# Patient Record
Sex: Female | Born: 1975 | Race: White | Hispanic: No | Marital: Married | Smoking: Never smoker
Health system: Southern US, Community
[De-identification: ages and names within clinical notes are randomized; demographics above are authoritative.]

## PROBLEM LIST (undated history)

## (undated) DIAGNOSIS — M2550 Pain in unspecified joint: Secondary | ICD-10-CM

## (undated) DIAGNOSIS — I10 Essential (primary) hypertension: Secondary | ICD-10-CM

## (undated) DIAGNOSIS — R87629 Unspecified abnormal cytological findings in specimens from vagina: Secondary | ICD-10-CM

## (undated) DIAGNOSIS — R0602 Shortness of breath: Secondary | ICD-10-CM

## (undated) DIAGNOSIS — R7303 Prediabetes: Secondary | ICD-10-CM

## (undated) DIAGNOSIS — N289 Disorder of kidney and ureter, unspecified: Secondary | ICD-10-CM

## (undated) DIAGNOSIS — E039 Hypothyroidism, unspecified: Secondary | ICD-10-CM

## (undated) DIAGNOSIS — F419 Anxiety disorder, unspecified: Secondary | ICD-10-CM

## (undated) DIAGNOSIS — Z9889 Other specified postprocedural states: Secondary | ICD-10-CM

## (undated) DIAGNOSIS — Z87442 Personal history of urinary calculi: Secondary | ICD-10-CM

## (undated) DIAGNOSIS — E079 Disorder of thyroid, unspecified: Secondary | ICD-10-CM

## (undated) DIAGNOSIS — R112 Nausea with vomiting, unspecified: Secondary | ICD-10-CM

## (undated) DIAGNOSIS — N200 Calculus of kidney: Secondary | ICD-10-CM

## (undated) DIAGNOSIS — M7989 Other specified soft tissue disorders: Secondary | ICD-10-CM

## (undated) DIAGNOSIS — E559 Vitamin D deficiency, unspecified: Secondary | ICD-10-CM

## (undated) HISTORY — DX: Disorder of thyroid, unspecified: E07.9

## (undated) HISTORY — DX: Anxiety disorder, unspecified: F41.9

## (undated) HISTORY — DX: Pain in unspecified joint: M25.50

## (undated) HISTORY — PX: NO PAST SURGERIES: SHX2092

## (undated) HISTORY — DX: Calculus of kidney: N20.0

## (undated) HISTORY — DX: Other specified soft tissue disorders: M79.89

## (undated) HISTORY — DX: Unspecified abnormal cytological findings in specimens from vagina: R87.629

## (undated) HISTORY — DX: Vitamin D deficiency, unspecified: E55.9

## (undated) HISTORY — DX: Disorder of kidney and ureter, unspecified: N28.9

## (undated) HISTORY — DX: Shortness of breath: R06.02

## (undated) HISTORY — DX: Prediabetes: R73.03

## (undated) HISTORY — DX: Essential (primary) hypertension: I10

## (undated) HISTORY — DX: Hypothyroidism, unspecified: E03.9

---

## 2007-08-03 ENCOUNTER — Other Ambulatory Visit: Admission: RE | Admit: 2007-08-03 | Discharge: 2007-08-03 | Payer: Self-pay | Admitting: Obstetrics and Gynecology

## 2007-08-20 ENCOUNTER — Inpatient Hospital Stay (HOSPITAL_COMMUNITY): Admission: AD | Admit: 2007-08-20 | Discharge: 2007-08-20 | Payer: Self-pay | Admitting: Obstetrics and Gynecology

## 2007-08-31 ENCOUNTER — Other Ambulatory Visit: Admission: RE | Admit: 2007-08-31 | Discharge: 2007-08-31 | Payer: Self-pay | Admitting: Obstetrics and Gynecology

## 2008-01-05 ENCOUNTER — Inpatient Hospital Stay (HOSPITAL_COMMUNITY): Admission: AD | Admit: 2008-01-05 | Discharge: 2008-01-07 | Payer: Self-pay | Admitting: Obstetrics & Gynecology

## 2008-01-05 ENCOUNTER — Ambulatory Visit: Payer: Self-pay | Admitting: Family

## 2010-02-11 ENCOUNTER — Other Ambulatory Visit: Admission: RE | Admit: 2010-02-11 | Discharge: 2010-02-11 | Payer: Self-pay | Admitting: Obstetrics and Gynecology

## 2011-04-15 NOTE — Consult Note (Signed)
NAME:  Teresa Osborne, Teresa Osborne NO.:  [id number]   MEDICAL RECORD NO.:  [id number]          PATIENT TYPE:  MAT   LOCATION:  [facility]                          FACILITY:  [facility]   PHYSICIAN:  Lazaro Arms, M.D.   DATE OF BIRTH:  March 06, 1976   DATE OF CONSULTATION:  08/20/2007  DATE OF DISCHARGE:                                 CONSULTATION   This is an MAU visit at [facility].   [name] is a 34 year old gravida 2, para 1, at 35 weeks' gestation who  came in complaining of some bleeding when she wiped earlier this evening  after work and then shortly thereafter.  Since she has been here, she  has had nothing significant, just a little bit of spotting when she  wiped.  She has not had a 20 week sonogram as of yet.  We obtained a  sonogram which revealed an anterior placenta, no previa, cervix 3.8 cm,  no evidence of blood in the uterus or behind the placenta.  Sterile  speculum exam was performed.  She does have a history of Chlamydia with  the pregnancy.  Chlamydia cultures obtained, and sterile speculum  reveals absolutely no blood in the vault whatsoever.  I cannot see any  source.  There has not been any evidence that she has had any.  In any  event, she is told to remain at pelvic rest, taken out of work for the  next couple of days, told to keep her routine scheduled appointment in  the office.      Lazaro Arms, M.D.  Electronically Signed     LHE/MEDQ  D:  08/20/2007  T:  08/21/2007  Job:  [id number]

## 2011-07-21 ENCOUNTER — Other Ambulatory Visit: Payer: Self-pay | Admitting: Adult Health

## 2011-07-21 ENCOUNTER — Other Ambulatory Visit (HOSPITAL_COMMUNITY)
Admission: RE | Admit: 2011-07-21 | Discharge: 2011-07-21 | Disposition: A | Discharge status: Home / Self Care | Payer: Self-pay | Source: Ambulatory Visit | Attending: Obstetrics and Gynecology | Admitting: Obstetrics and Gynecology

## 2011-07-21 DIAGNOSIS — Z01419 Encounter for gynecological examination (general) (routine) without abnormal findings: Secondary | ICD-10-CM | POA: Insufficient documentation

## 2011-07-21 DIAGNOSIS — Z113 Encounter for screening for infections with a predominantly sexual mode of transmission: Secondary | ICD-10-CM | POA: Insufficient documentation

## 2011-08-22 LAB — COMPREHENSIVE METABOLIC PANEL
ALT: 14
Albumin: 2.4 — ABNORMAL LOW
Alkaline Phosphatase: 109
BUN: 7
Chloride: 105
Glucose, Bld: 135 — ABNORMAL HIGH
Potassium: 3.9
Sodium: 135
Total Bilirubin: 0.6
Total Protein: 6.4

## 2011-08-22 LAB — CBC
HCT: 38.3
Hemoglobin: 13.2
Platelets: 289
RDW: 14.3
WBC: 10.3

## 2011-09-11 LAB — URINALYSIS, ROUTINE W REFLEX MICROSCOPIC
Bilirubin Urine: NEGATIVE
Hgb urine dipstick: NEGATIVE
Ketones, ur: NEGATIVE
Specific Gravity, Urine: 1.02
pH: 6

## 2011-09-11 LAB — GC/CHLAMYDIA PROBE AMP, GENITAL: Chlamydia, DNA Probe: NEGATIVE

## 2012-06-03 ENCOUNTER — Emergency Department (HOSPITAL_COMMUNITY)
Admission: EM | Admit: 2012-06-03 | Discharge: 2012-06-03 | Disposition: A | Discharge status: Home / Self Care | Payer: BC Managed Care – PPO | Attending: Emergency Medicine | Admitting: Emergency Medicine

## 2012-06-03 ENCOUNTER — Encounter (HOSPITAL_COMMUNITY): Payer: Self-pay | Admitting: Emergency Medicine

## 2012-06-03 ENCOUNTER — Emergency Department (HOSPITAL_COMMUNITY): Payer: BC Managed Care – PPO

## 2012-06-03 DIAGNOSIS — N200 Calculus of kidney: Secondary | ICD-10-CM | POA: Insufficient documentation

## 2012-06-03 DIAGNOSIS — R110 Nausea: Secondary | ICD-10-CM | POA: Insufficient documentation

## 2012-06-03 DIAGNOSIS — R109 Unspecified abdominal pain: Secondary | ICD-10-CM | POA: Insufficient documentation

## 2012-06-03 DIAGNOSIS — Z79899 Other long term (current) drug therapy: Secondary | ICD-10-CM | POA: Insufficient documentation

## 2012-06-03 DIAGNOSIS — M549 Dorsalgia, unspecified: Secondary | ICD-10-CM | POA: Insufficient documentation

## 2012-06-03 LAB — CBC WITH DIFFERENTIAL/PLATELET
Basophils Absolute: 0 10*3/uL (ref 0.0–0.1)
HCT: 39.2 % (ref 36.0–46.0)
Hemoglobin: 13.2 g/dL (ref 12.0–15.0)
Lymphocytes Relative: 20 % (ref 12–46)
Lymphs Abs: 1.8 10*3/uL (ref 0.7–4.0)
MCV: 89.3 fL (ref 78.0–100.0)
Monocytes Absolute: 0.7 10*3/uL (ref 0.1–1.0)
Monocytes Relative: 8 % (ref 3–12)
Neutro Abs: 6 10*3/uL (ref 1.7–7.7)
RBC: 4.39 MIL/uL (ref 3.87–5.11)
WBC: 8.6 10*3/uL (ref 4.0–10.5)

## 2012-06-03 LAB — URINALYSIS, ROUTINE W REFLEX MICROSCOPIC
Bilirubin Urine: NEGATIVE
Glucose, UA: NEGATIVE mg/dL
Nitrite: NEGATIVE
Specific Gravity, Urine: 1.028 (ref 1.005–1.030)
pH: 6 (ref 5.0–8.0)

## 2012-06-03 LAB — POCT I-STAT, CHEM 8
BUN: 14 mg/dL (ref 6–23)
Chloride: 105 mEq/L (ref 96–112)
Glucose, Bld: 105 mg/dL — ABNORMAL HIGH (ref 70–99)
HCT: 46 % (ref 36.0–46.0)
Potassium: 3.9 mEq/L (ref 3.5–5.1)

## 2012-06-03 LAB — URINE MICROSCOPIC-ADD ON

## 2012-06-03 MED ORDER — OXYCODONE-ACETAMINOPHEN 5-325 MG PO TABS: 1.0000 | ORAL_TABLET | ORAL | Status: AC | PRN

## 2012-06-03 MED ORDER — TAMSULOSIN HCL 0.4 MG PO CAPS: 0.4000 mg | ORAL_CAPSULE | Freq: Every day | ORAL | Status: DC

## 2012-06-03 MED ORDER — KETOROLAC TROMETHAMINE 30 MG/ML IJ SOLN
30.0000 mg | Freq: Once | INTRAMUSCULAR | Status: AC
  Administered 2012-06-03: 30 mg via INTRAVENOUS
  Filled 2012-06-03: qty 1

## 2012-06-03 MED ORDER — TAMSULOSIN HCL 0.4 MG PO CAPS
0.4000 mg | ORAL_CAPSULE | Freq: Once | ORAL | Status: AC
  Administered 2012-06-03: 0.4 mg via ORAL
  Filled 2012-06-03: qty 1

## 2012-06-03 MED ORDER — IBUPROFEN 600 MG PO TABS: 600.0000 mg | ORAL_TABLET | Freq: Four times a day (QID) | ORAL | Status: AC | PRN

## 2012-06-03 MED ORDER — ONDANSETRON HCL 4 MG/2ML IJ SOLN
4.0000 mg | Freq: Once | INTRAMUSCULAR | Status: AC
  Administered 2012-06-03: 4 mg via INTRAVENOUS
  Filled 2012-06-03: qty 2

## 2012-06-03 NOTE — ED Notes (Signed)
Pt states she is having pain in her left side that radiates into her back  Pt states she has some menstrual cramping as well   Pt states her pain started around 2130 last night  Pt states has nausea without diarrhea or vomiting Denies urinary sxs or female issues

## 2012-06-03 NOTE — ED Notes (Signed)
Patient transported to CT 

## 2012-06-03 NOTE — ED Provider Notes (Signed)
History     CSN: [id number]  Arrival date & time 06/03/12  4098   First MD Initiated Contact with Patient 06/03/12 0602      Chief Complaint  Patient presents with  . Abdominal Pain    (Consider location/radiation/quality/duration/timing/severity/associated sxs/prior treatment) Patient is a 36 y.o. female presenting with abdominal pain. The history is provided by the patient.  Abdominal Pain The primary symptoms of the illness include abdominal pain and nausea. The primary symptoms of the illness do not include fever, shortness of breath, vomiting, diarrhea or dysuria.  Additional symptoms associated with the illness include back pain. Symptoms associated with the illness do not include chills, urgency, hematuria or frequency.  Pt with intermittent left flank pain for last month. States went to [facility] and was told she may have a kidney stone, this was a month ago. She was given pain medications. Stats pain subsided since then and began again yesterday. Pt denies fever, chills, urinary symptoms. Pain in left flank, radiating into lower abdomen. Denies vagina discharge or bleeding.   History reviewed. No pertinent past medical history.  History reviewed. No pertinent past surgical history.  Family History  Problem Relation Age of Onset  . Cancer Other     History  Substance Use Topics  . Smoking status: Never Smoker   . Smokeless tobacco: Not on file  . Alcohol Use: No    OB History    Grav Para Term Preterm Abortions TAB SAB Ect Mult Living                  Review of Systems  Constitutional: Negative for fever and chills.  Respiratory: Negative for chest tightness and shortness of breath.   Cardiovascular: Negative.   Gastrointestinal: Positive for nausea and abdominal pain. Negative for vomiting and diarrhea.  Genitourinary: Positive for flank pain. Negative for dysuria, urgency, frequency and hematuria.  Musculoskeletal: Positive for back pain.  Skin: Negative.     Neurological: Negative for dizziness, weakness, numbness and headaches.    Allergies  Review of patient's allergies indicates no known allergies.  Home Medications   Current Outpatient Rx  Name Route Sig Dispense Refill  . CIPROFLOXACIN HCL 500 MG PO TABS Oral Take 500 mg by mouth 2 (two) times daily. For 10 days. Start date:06/02/12    . HYDROCODONE-ACETAMINOPHEN 5-325 MG PO TABS Oral Take 1 tablet by mouth every 6 (six) hours as needed.      BP 143/80  Pulse 111  Temp 97.7 F (36.5 C) (Oral)  Resp 19  SpO2 98%  Physical Exam  Nursing note and vitals reviewed. Constitutional: She is oriented to person, place, and time. She appears well-developed and well-nourished. No distress.       obese  HENT:  Head: Normocephalic.  Eyes: Conjunctivae are normal.  Cardiovascular: Normal rate, regular rhythm and normal heart sounds.   Pulmonary/Chest: Effort normal and breath sounds normal. No respiratory distress. She has no wheezes. She has no rales.  Abdominal: Soft. Bowel sounds are normal. She exhibits no distension. There is no tenderness.       cva tenderness left  Neurological: She is alert and oriented to person, place, and time.  Skin: Skin is warm and dry.  Psychiatric: She has a normal mood and affect.    ED Course  Procedures (including critical care time)  Left flank pain intermittently for a month. Story consistent with a kidney stone. UA, ct pending.   Results for orders placed during the hospital encounter  of 06/03/12  CBC WITH DIFFERENTIAL      Component Value Range   WBC 8.6  4.0 - 10.5 K/uL   RBC 4.39  3.87 - 5.11 MIL/uL   Hemoglobin 13.2  12.0 - 15.0 g/dL   HCT 91.4  78.2 - 95.6 %   MCV 89.3  78.0 - 100.0 fL   MCH 30.1  26.0 - 34.0 pg   MCHC 33.7  30.0 - 36.0 g/dL   RDW 21.3  08.6 - 57.8 %   Platelets 332  150 - 400 K/uL   Neutrophils Relative 69  43 - 77 %   Neutro Abs 6.0  1.7 - 7.7 K/uL   Lymphocytes Relative 20  12 - 46 %   Lymphs Abs 1.8  0.7 -  4.0 K/uL   Monocytes Relative 8  3 - 12 %   Monocytes Absolute 0.7  0.1 - 1.0 K/uL   Eosinophils Relative 2  0 - 5 %   Eosinophils Absolute 0.2  0.0 - 0.7 K/uL   Basophils Relative 0  0 - 1 %   Basophils Absolute 0.0  0.0 - 0.1 K/uL  URINALYSIS, ROUTINE W REFLEX MICROSCOPIC      Component Value Range   Color, Urine YELLOW  YELLOW   APPearance CLOUDY (*) CLEAR   Specific Gravity, Urine 1.028  1.005 - 1.030   pH 6.0  5.0 - 8.0   Glucose, UA NEGATIVE  NEGATIVE mg/dL   Hgb urine dipstick LARGE (*) NEGATIVE   Bilirubin Urine NEGATIVE  NEGATIVE   Ketones, ur NEGATIVE  NEGATIVE mg/dL   Protein, ur 30 (*) NEGATIVE mg/dL   Urobilinogen, UA 0.2  0.0 - 1.0 mg/dL   Nitrite NEGATIVE  NEGATIVE   Leukocytes, UA TRACE (*) NEGATIVE  POCT I-STAT, CHEM 8      Component Value Range   Sodium 140  135 - 145 mEq/L   Potassium 3.9  3.5 - 5.1 mEq/L   Chloride 105  96 - 112 mEq/L   BUN 14  6 - 23 mg/dL   Creatinine, Ser 4.69 (*) 0.50 - 1.10 mg/dL   Glucose, Bld 629 (*) 70 - 99 mg/dL   Calcium, Ion 5.28  4.13 - 1.23 mmol/L   TCO2 22  0 - 100 mmol/L   Hemoglobin 15.6 (*) 12.0 - 15.0 g/dL   HCT 24.4  01.0 - 27.2 %  URINE MICROSCOPIC-ADD ON      Component Value Range   Squamous Epithelial / LPF FEW (*) RARE   WBC, UA 0-2  <3 WBC/hpf   RBC / HPF 7-10  <3 RBC/hpf   Bacteria, UA MANY (*) RARE   Ct Abdomen Pelvis Wo Contrast  06/03/2012  *RADIOLOGY REPORT*  Clinical Data: Left flank pain  CT ABDOMEN AND PELVIS WITHOUT CONTRAST  Technique:  Multidetector CT imaging of the abdomen and pelvis was performed following the standard protocol without intravenous contrast.  Comparison: None.  Findings: Limited images through the lung bases demonstrate no significant appreciable abnormality. The heart size is within normal limits. No pleural or pericardial effusion.  Organ abnormality/lesion detection is limited in the absence of intravenous contrast. Within this limitation, low attenuation of the liver is in keeping  with fatty infiltration.  Unremarkable biliary system, spleen, pancreas, adrenal glands.  Unremarkable right kidney.  The left kidney is edematous with perinephric fat stranding.  There is a mild to moderate hydroureteronephrosis to the level of a 3.5 mm left UVJ stone.  No bowel obstruction.  No  CT evidence for colitis.  Normal appendix.  No free intraperitoneal air or fluid.  No lymphadenopathy.  Normal caliber vasculature.  Partially decompressed bladder.  IUD within the uterus. Ovaries identified however cannot be further characterized by noncontrast CT.  No acute osseous finding. L5-S1 degenerative disc disease.  IMPRESSION: Left renal edema, mild hydronephrosis and moderate hydroureter to the level of a 3.5 mm left UVJ stone.  Hepatic steatosis.  Original Report Authenticated By: Waneta Martins, M.D.   7:46 AM Spoke with Dr. Annabell Howells urology. Recommended continue cipro (pt on it since yesterday after seeing her GYN), flomax, pain medications, follow up in the office. Pt agreeable with the plan.      1. Kidney stone on left side       MDM         Lottie Mussel, PA 06/03/12 1535

## 2012-06-03 NOTE — ED Notes (Signed)
PA Kirichenko at bedside 

## 2012-06-03 NOTE — ED Provider Notes (Signed)
Medical screening examination/treatment/procedure(s) were performed by non-physician practitioner and as supervising physician I was immediately available for consultation/collaboration.  Flint Melter, MD 06/03/12 [phone]

## 2012-06-03 NOTE — ED Notes (Signed)
Pt states she has been seen at urgent care and by her gyn dr  Pt was started on antibiotics by her gyn for possible uterus infection

## 2013-03-30 ENCOUNTER — Ambulatory Visit (INDEPENDENT_AMBULATORY_CARE_PROVIDER_SITE_OTHER): Payer: BC Managed Care – PPO | Admitting: Advanced Practice Midwife

## 2013-03-30 ENCOUNTER — Encounter: Payer: Self-pay | Admitting: Advanced Practice Midwife

## 2013-03-30 VITALS — BP 134/70 | Ht 65.0 in | Wt >= 6400 oz

## 2013-03-30 DIAGNOSIS — Z3200 Encounter for pregnancy test, result unknown: Secondary | ICD-10-CM

## 2013-03-30 DIAGNOSIS — Z30430 Encounter for insertion of intrauterine contraceptive device: Secondary | ICD-10-CM

## 2013-03-30 DIAGNOSIS — Z3202 Encounter for pregnancy test, result negative: Secondary | ICD-10-CM

## 2013-03-30 DIAGNOSIS — Z975 Presence of (intrauterine) contraceptive device: Secondary | ICD-10-CM

## 2013-03-30 DIAGNOSIS — Z30432 Encounter for removal of intrauterine contraceptive device: Secondary | ICD-10-CM

## 2013-03-30 DIAGNOSIS — Z6841 Body Mass Index (BMI) 40.0 and over, adult: Secondary | ICD-10-CM

## 2013-03-30 LAB — POCT URINE PREGNANCY: Preg Test, Ur: NEGATIVE

## 2013-03-30 NOTE — Progress Notes (Signed)
Teresa Osborne is a 37 y.o. year old African American female Gravida 2 Para 2  who presents for removal and replacement of a Mirena IUD.  It has been 5 years since her previous IUD placement.  The risks and benefits of the method and placement have been thouroughly reviewed with the patient and all questions were answered.  Specifically the patient is aware of failure rate of 12/998, expulsion of the IUD and of possible perforation.  The patient is aware of irregular bleeding due to the method and understands the incidence of irregular bleeding diminishes with time.  Time out was performed.  A Graves speculum was placed.  The cervix was very difficult to access;  A large snowman speculum was used and the cervix was very posterior. It was prepped using Betadine. The strings were found to be  visible.   They were grasped and the Mirena was easily removed. The cervix was then grasped with a tenaculum and the uterus was sounded to 8 cm. The IUD was inserted to 8 cm.  It was pulled back 1 cm and the IUD was disengaged.  The strings were trimmed to 3 cm.  Sonogram was performed and the proper placement of the IUD was verified.  The patient was instructed on signs and symptoms of infection and to check for the strings after each menses or each month.  The patient is to refrain from intercourse for 3 days.

## 2013-03-30 NOTE — Patient Instructions (Signed)
Levonorgestrel intrauterine device (IUD) What is this medicine? LEVONORGESTREL IUD (LEE voe nor jes trel) is a contraceptive (birth control) device. The device is placed inside the uterus by a healthcare professional. It is used to prevent pregnancy and can also be used to treat heavy bleeding that occurs during your period. Depending on the device, it can be used for 3 to 5 years. This medicine may be used for other purposes; ask your health care provider or pharmacist if you have questions. What should I tell my health care provider before I take this medicine? They need to know if you have any of these conditions: -abnormal Pap smear -cancer of the breast, uterus, or cervix -diabetes -endometritis -genital or pelvic infection now or in the past -have more than one sexual partner or your partner has more than one partner -heart disease -history of an ectopic or tubal pregnancy -immune system problems -IUD in place -liver disease or tumor -problems with blood clots or take blood-thinners -use intravenous drugs -uterus of unusual shape -vaginal bleeding that has not been explained -an unusual or allergic reaction to levonorgestrel, other hormones, silicone, or polyethylene, medicines, foods, dyes, or preservatives -pregnant or trying to get pregnant -breast-feeding How should I use this medicine? This device is placed inside the uterus by a health care professional. Talk to your pediatrician regarding the use of this medicine in children. Special care may be needed. Overdosage: If you think you have taken too much of this medicine contact a poison control center or emergency room at once. NOTE: This medicine is only for you. Do not share this medicine with others. What if I miss a dose? This does not apply. What may interact with this medicine? Do not take this medicine with any of the following medications: -amprenavir -bosentan -fosamprenavir This medicine may also interact with  the following medications: -aprepitant -barbiturate medicines for inducing sleep or treating seizures -bexarotene -griseofulvin -medicines to treat seizures like carbamazepine, ethotoin, felbamate, oxcarbazepine, phenytoin, topiramate -modafinil -pioglitazone -rifabutin -rifampin -rifapentine -some medicines to treat HIV infection like atazanavir, indinavir, lopinavir, nelfinavir, tipranavir, ritonavir -St. John's wort -warfarin This list may not describe all possible interactions. Give your health care provider a list of all the medicines, herbs, non-prescription drugs, or dietary supplements you use. Also tell them if you smoke, drink alcohol, or use illegal drugs. Some items may interact with your medicine. What should I watch for while using this medicine? Visit your doctor or health care professional for regular check ups. See your doctor if you or your partner has sexual contact with others, becomes HIV positive, or gets a sexual transmitted disease. This product does not protect you against HIV infection (AIDS) or other sexually transmitted diseases. You can check the placement of the IUD yourself by reaching up to the top of your vagina with clean fingers to feel the threads. Do not pull on the threads. It is a good habit to check placement after each menstrual period. Call your doctor right away if you feel more of the IUD than just the threads or if you cannot feel the threads at all. The IUD may come out by itself. You may become pregnant if the device comes out. If you notice that the IUD has come out use a backup birth control method like condoms and call your health care provider. Using tampons will not change the position of the IUD and are okay to use during your period. What side effects may I notice from receiving this medicine?   Side effects that you should report to your doctor or health care professional as soon as possible: -allergic reactions like skin rash, itching or  hives, swelling of the face, lips, or tongue -fever, flu-like symptoms -genital sores -high blood pressure -no menstrual period for 6 weeks during use -pain, swelling, warmth in the leg -pelvic pain or tenderness -severe or sudden headache -signs of pregnancy -stomach cramping -sudden shortness of breath -trouble with balance, talking, or walking -unusual vaginal bleeding, discharge -yellowing of the eyes or skin Side effects that usually do not require medical attention (report to your doctor or health care professional if they continue or are bothersome): -acne -breast pain -change in sex drive or performance -changes in weight -cramping, dizziness, or faintness while the device is being inserted -headache -irregular menstrual bleeding within first 3 to 6 months of use -nausea This list may not describe all possible side effects. Call your doctor for medical advice about side effects. You may report side effects to FDA at 1-800-FDA-1088. Where should I keep my medicine? This does not apply. NOTE: This sheet is a summary. It may not cover all possible information. If you have questions about this medicine, talk to your doctor, pharmacist, or health care provider.  2013, Elsevier/Gold Standard. (12/18/2011 1:54:04 PM)  

## 2013-04-28 ENCOUNTER — Ambulatory Visit: Payer: BC Managed Care – PPO | Admitting: Advanced Practice Midwife

## 2013-04-28 ENCOUNTER — Encounter: Payer: Self-pay | Admitting: *Deleted

## 2014-10-02 ENCOUNTER — Encounter: Payer: Self-pay | Admitting: Advanced Practice Midwife

## 2016-01-09 ENCOUNTER — Ambulatory Visit (INDEPENDENT_AMBULATORY_CARE_PROVIDER_SITE_OTHER): Payer: BLUE CROSS/BLUE SHIELD | Admitting: Family Medicine

## 2016-01-09 ENCOUNTER — Encounter: Payer: Self-pay | Admitting: Family Medicine

## 2016-01-09 VITALS — BP 136/73 | HR 62 | Temp 98.3°F | Resp 20 | Ht 65.0 in | Wt >= 6400 oz

## 2016-01-09 DIAGNOSIS — E669 Obesity, unspecified: Secondary | ICD-10-CM | POA: Diagnosis not present

## 2016-01-09 DIAGNOSIS — Z131 Encounter for screening for diabetes mellitus: Secondary | ICD-10-CM | POA: Diagnosis not present

## 2016-01-09 DIAGNOSIS — Z1321 Encounter for screening for nutritional disorder: Secondary | ICD-10-CM | POA: Diagnosis not present

## 2016-01-09 DIAGNOSIS — Z7689 Persons encountering health services in other specified circumstances: Secondary | ICD-10-CM

## 2016-01-09 DIAGNOSIS — Z130 Encounter for screening for diseases of the blood and blood-forming organs and certain disorders involving the immune mechanism: Secondary | ICD-10-CM | POA: Diagnosis not present

## 2016-01-09 DIAGNOSIS — Z13220 Encounter for screening for lipoid disorders: Secondary | ICD-10-CM

## 2016-01-09 DIAGNOSIS — Z7189 Other specified counseling: Secondary | ICD-10-CM | POA: Diagnosis not present

## 2016-01-09 DIAGNOSIS — Z6841 Body Mass Index (BMI) 40.0 and over, adult: Secondary | ICD-10-CM | POA: Diagnosis not present

## 2016-01-09 DIAGNOSIS — E559 Vitamin D deficiency, unspecified: Secondary | ICD-10-CM

## 2016-01-09 DIAGNOSIS — Z0000 Encounter for general adult medical examination without abnormal findings: Secondary | ICD-10-CM | POA: Insufficient documentation

## 2016-01-09 LAB — COMPREHENSIVE METABOLIC PANEL
ALT: 14 U/L (ref 0–35)
AST: 12 U/L (ref 0–37)
Albumin: 3.9 g/dL (ref 3.5–5.2)
Alkaline Phosphatase: 69 U/L (ref 39–117)
BUN: 11 mg/dL (ref 6–23)
CHLORIDE: 107 meq/L (ref 96–112)
CO2: 28 mEq/L (ref 19–32)
Calcium: 9.2 mg/dL (ref 8.4–10.5)
Creatinine, Ser: 0.72 mg/dL (ref 0.40–1.20)
GFR: 95.4 mL/min (ref >60.00)
GLUCOSE: 87 mg/dL (ref 70–99)
POTASSIUM: 4.7 meq/L (ref 3.5–5.1)
SODIUM: 141 meq/L (ref 135–145)
Total Bilirubin: 0.3 mg/dL (ref 0.2–1.2)
Total Protein: 7 g/dL (ref 6.0–8.3)

## 2016-01-09 LAB — CBC WITH DIFFERENTIAL/PLATELET
BASOS ABS: 0.1 10*3/uL (ref 0.0–0.1)
Basophils Relative: 1.2 % (ref 0.0–3.0)
EOS ABS: 0.2 10*3/uL (ref 0.0–0.7)
Eosinophils Relative: 2.9 % (ref 0.0–5.0)
HCT: 43 % (ref 36.0–46.0)
Hemoglobin: 14.1 g/dL (ref 12.0–15.0)
LYMPHS ABS: 1.8 10*3/uL (ref 0.7–4.0)
Lymphocytes Relative: 33.5 % (ref 12.0–46.0)
MCHC: 32.8 g/dL (ref 30.0–36.0)
MCV: 91.9 fl (ref 78.0–100.0)
Monocytes Absolute: 0.3 10*3/uL (ref 0.1–1.0)
Monocytes Relative: 5.3 % (ref 3.0–12.0)
NEUTROS ABS: 3.1 10*3/uL (ref 1.4–7.7)
NEUTROS PCT: 57.1 % (ref 43.0–77.0)
PLATELETS: 328 10*3/uL (ref 150.0–400.0)
RBC: 4.67 Mil/uL (ref 3.87–5.11)
RDW: 13.6 % (ref 11.5–15.5)
WBC: 5.5 10*3/uL (ref 4.0–10.5)

## 2016-01-09 LAB — TSH: TSH: 2.69 u[IU]/mL (ref 0.35–4.50)

## 2016-01-09 LAB — VITAMIN D 25 HYDROXY (VIT D DEFICIENCY, FRACTURES): VITD: 17.58 ng/mL — AB (ref 30.00–100.00)

## 2016-01-09 LAB — LIPID PANEL
CHOL/HDL RATIO: 4
Cholesterol: 178 mg/dL (ref 0–200)
HDL: 49.1 mg/dL (ref >39.00)
LDL CALC: 112 mg/dL — AB (ref 0–99)
NONHDL: 128.51
Triglycerides: 84 mg/dL (ref 0.0–149.0)
VLDL: 16.8 mg/dL (ref 0.0–40.0)

## 2016-01-09 LAB — HEMOGLOBIN A1C: Hgb A1c MFr Bld: 5.5 % (ref 4.6–6.5)

## 2016-01-09 NOTE — Progress Notes (Signed)
Patient ID: Teresa Osborne, female   DOB: 1976/06/26, 40 y.o.   MRN: [id number]      Patient ID: Teresa Osborne, female  DOB: 11-Nov-1976, 40 y.o.   MRN: [id number]  Subjective:  Teresa Osborne is a 40 y.o. female present for establishment of care with annual exam. All past medical history, surgical history, allergies, family history, immunizations, medications and social history were obtained and entered in the electronic medical record today. All recent labs, ED visits and hospitalizations within the last year were reviewed.  Health maintenance:  Colonoscopy: No known family history of colon cancer. Routine screening at age 25. Mammogram: No known history of breast cancer in the family. Routine screening age 40 Cervical cancer screening: family Tree, [place]. Teresa Finner, NP. Last Pap 2014, per patient report. Reports all normal Paps. Continue follow-up with gynecology. Immunizations: tdap 2008, declines flu shot Infectious disease screening: HIV 2008 DEXA: Routine screening at age 8 Assistive device: None Oxygen use: None  Patient has a Dental home. [place] Hospitalizations/ED visits: None  Past Medical History  Diagnosis Date  . Anxiety   . Kidney stones    No Known Allergies Past Surgical History  Procedure Laterality Date  . No past surgeries     Family History  Problem Relation Age of Onset  . Cancer Paternal Grandfather     pancreatic  . Alcohol abuse Paternal Grandfather   . Arthritis Paternal Grandfather   . COPD Paternal Grandfather   . Early death Paternal Grandfather   . Cancer Paternal Grandmother     unknown, [place]  . Arthritis Paternal Grandmother   . COPD Paternal Grandmother   . Early death Paternal Grandmother   . Arthritis Father   . Mental retardation Father   . Arthritis Maternal Grandmother   . COPD Maternal Grandfather   . Heart disease Maternal Grandfather   . Cancer Sister 24    ovarian   Social History   Social History  .  Marital Status: Married    Spouse Name: N/A  . Number of Children: N/A  . Years of Education: N/A   Occupational History  . Not on file.   Social History Main Topics  . Smoking status: Never Smoker   . Smokeless tobacco: Never Used  . Alcohol Use: No  . Drug Use: No  . Sexual Activity: Yes    Birth Control/ Protection: IUD     Comment: mirena 03/2013   Other Topics Concern  . Not on file   Social History Narrative   Married to [place]. 2 children [name] in [place].   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   Drink caffeinated beverages, wears her seatbelt, wears a bike helmet.   Smoke detector in the home   Feels safe in her relationships      ROS: Negative, with the exception of above mentioned in HPI  Objective: BP 136/73 mmHg  Pulse 62  Temp(Src) 98.3 F (36.8 C) (Oral)  Resp 20  Ht 5\' 5"  (1.651 m)  Wt 411 lb 8 oz (186.655 kg)  BMI 68.48 kg/m2  SpO2 96% Gen: Afebrile. No acute distress. Nontoxic in appearance, well-developed, well-nourished, morbidly obese, Caucasian female. Pleasant. HENT: AT. [place]. Bilateral TM visualized and normal in appearance, normal external auditory canal. MMM, no oral lesions, good dentition. Bilateral nares without erythema or swelling. Throat without erythema, ulcerations or exudates. No Cough on exam, no hoarseness on exam. Eyes:Pupils Equal Round Reactive to light, Extraocular movements intact,  Conjunctiva without redness, discharge or icterus.  Neck/lymp/endocrine: Supple, no lymphadenopathy, no thyromegaly CV: RRR nom/c/g/r, no edema, +2/4 P posterior tibialis pulses.  No JVD. Chest: CTAB, no wheeze, rhonchi or crackles. Normal Respiratory effort. Good Air movement. Abd: Soft. Morbidly obese. NTND. BS present. No Masses palpated. No hepatosplenomegaly. No rebound tenderness or guarding. Exam hindered by body habitus. Skin: No rashes, purpura or petechiae. Warm and well-perfused. Skin intact. Neuro/Msk:  Normal gait.  PERLA. EOMi. Alert. Oriented x3.  Cranial nerves II through XII intact.  Psych: Normal affect, dress and demeanor. Normal speech. Normal thought content and judgment.  Assessment/plan: Teresa Osborne is a 40 y.o. female  present for establishment of care with annual exam.  BMI 60.0-69.9, adult (HCC)/Screening for diabetes mellitus/Lipid screening/ Screening for iron deficiency anemia/Encounter for vitamin deficiency screening/Routine health maintenance/Encounter for preventive health examination - Encouraged low saturated fat, healthy fruits and vegetables, lean meats. Discuss community exercise programs and information was given to patient today for local programs. - Patient was encouraged to consider Weight Watchers again. Patient was also instructed on how to calculate daily caloric intake as well as phone applications to track calories. - Hemoglobin A1C - CBC w/Diff - Lipid panel - VITAMIN D 25 Hydroxy (Vit-D Deficiency, Fractures) - TSH - Comp Met (CMET) - Patient will be called with all lab results, depending upon results may need to see sooner, otherwise yearly follow-up.  Health maintenance:  Colonoscopy: No known family history of colon cancer. Routine screening at age 5. Mammogram: No known history of breast cancer in the family. Routine screening age 29 Cervical cancer screening: family Tree, [place]. Teresa Bur, NP. Last Pap 2014, per patient report. Reports all normal Paps. Continue follow-up with gynecology. Immunizations: tdap 2008, declines flu shot Infectious disease screening: HIV 2008 DEXA: Routine screening at age 82  Patient was encouraged to exercise greater than 150 minutes a week. Patient was encouraged to choose a diet filled with fresh fruits and vegetables, and lean meats. AVS provided to patient today for education/recommendation on gender specific health and safety maintenance.  No Follow-up on file.   Howard Pouch, DO [place]

## 2016-01-09 NOTE — Patient Instructions (Signed)
Health Maintenance, Female Adopting a healthy lifestyle and getting preventive care can go a long way to promote health and wellness. Talk with your health care provider about what schedule of regular examinations is right for you. This is a good chance for you to check in with your provider about disease prevention and staying healthy. In between checkups, there are plenty of things you can do on your own. Experts have done a lot of research about which lifestyle changes and preventive measures are most likely to keep you healthy. Ask your health care provider for more information. WEIGHT AND DIET  Eat a healthy diet  Be sure to include plenty of vegetables, fruits, low-fat dairy products, and lean protein.  Do not eat a lot of foods high in solid fats, added sugars, or salt.  Get regular exercise. This is one of the most important things you can do for your health.  Most adults should exercise for at least 150 minutes each week. The exercise should increase your heart rate and make you sweat (moderate-intensity exercise).  Most adults should also do strengthening exercises at least twice a week. This is in addition to the moderate-intensity exercise.  Maintain a healthy weight  Body mass index (BMI) is a measurement that can be used to identify possible weight problems. It estimates body fat based on height and weight. Your health care provider can help determine your BMI and help you achieve or maintain a healthy weight.  For females 20 years of age and older:   A BMI below 18.5 is considered underweight.  A BMI of 18.5 to 24.9 is normal.  A BMI of 25 to 29.9 is considered overweight.  A BMI of 30 and above is considered obese.  Watch levels of cholesterol and blood lipids  You should start having your blood tested for lipids and cholesterol at 40 years of age, then have this test every 5 years.  You may need to have your cholesterol levels checked more often if:  Your lipid  or cholesterol levels are high.  You are older than 40 years of age.  You are at high risk for heart disease.  CANCER SCREENING   Lung Cancer  Lung cancer screening is recommended for adults 55-80 years old who are at high risk for lung cancer because of a history of smoking.  A yearly low-dose CT scan of the lungs is recommended for people who:  Currently smoke.  Have quit within the past 15 years.  Have at least a 30-pack-year history of smoking. A pack year is smoking an average of one pack of cigarettes a day for 1 year.  Yearly screening should continue until it has been 15 years since you quit.  Yearly screening should stop if you develop a health problem that would prevent you from having lung cancer treatment.  Breast Cancer  Practice breast self-awareness. This means understanding how your breasts normally appear and feel.  It also means doing regular breast self-exams. Let your health care provider know about any changes, no matter how small.  If you are in your 20s or 30s, you should have a clinical breast exam (CBE) by a health care provider every 1-3 years as part of a regular health exam.  If you are 40 or older, have a CBE every year. Also consider having a breast X-ray (mammogram) every year.  If you have a family history of breast cancer, talk to your health care provider about genetic screening.  If you   are at high risk for breast cancer, talk to your health care provider about having an MRI and a mammogram every year.  Breast cancer gene (BRCA) assessment is recommended for women who have family members with BRCA-related cancers. BRCA-related cancers include:  Breast.  Ovarian.  Tubal.  Peritoneal cancers.  Results of the assessment will determine the need for genetic counseling and BRCA1 and BRCA2 testing. Cervical Cancer Your health care provider may recommend that you be screened regularly for cancer of the pelvic organs (ovaries, uterus, and  vagina). This screening involves a pelvic examination, including checking for microscopic changes to the surface of your cervix (Pap test). You may be encouraged to have this screening done every 3 years, beginning at age 21.  For women ages 30-65, health care providers may recommend pelvic exams and Pap testing every 3 years, or they may recommend the Pap and pelvic exam, combined with testing for human papilloma virus (HPV), every 5 years. Some types of HPV increase your risk of cervical cancer. Testing for HPV may also be done on women of any age with unclear Pap test results.  Other health care providers may not recommend any screening for nonpregnant women who are considered low risk for pelvic cancer and who do not have symptoms. Ask your health care provider if a screening pelvic exam is right for you.  If you have had past treatment for cervical cancer or a condition that could lead to cancer, you need Pap tests and screening for cancer for at least 20 years after your treatment. If Pap tests have been discontinued, your risk factors (such as having a new sexual partner) need to be reassessed to determine if screening should resume. Some women have medical problems that increase the chance of getting cervical cancer. In these cases, your health care provider may recommend more frequent screening and Pap tests. Colorectal Cancer  This type of cancer can be detected and often prevented.  Routine colorectal cancer screening usually begins at 40 years of age and continues through 40 years of age.  Your health care provider may recommend screening at an earlier age if you have risk factors for colon cancer.  Your health care provider may also recommend using home test kits to check for hidden blood in the stool.  A small camera at the end of a tube can be used to examine your colon directly (sigmoidoscopy or colonoscopy). This is done to check for the earliest forms of colorectal  cancer.  Routine screening usually begins at age 50.  Direct examination of the colon should be repeated every 5-10 years through 40 years of age. However, you may need to be screened more often if early forms of precancerous polyps or small growths are found. Skin Cancer  Check your skin from head to toe regularly.  Tell your health care provider about any new moles or changes in moles, especially if there is a change in a mole's shape or color.  Also tell your health care provider if you have a mole that is larger than the size of a pencil eraser.  Always use sunscreen. Apply sunscreen liberally and repeatedly throughout the day.  Protect yourself by wearing long sleeves, pants, a wide-brimmed hat, and sunglasses whenever you are outside. HEART DISEASE, DIABETES, AND HIGH BLOOD PRESSURE   High blood pressure causes heart disease and increases the risk of stroke. High blood pressure is more likely to develop in:  People who have blood pressure in the high end   of the normal range (130-139/85-89 mm Hg).  People who are overweight or obese.  People who are African American.  If you are 38-23 years of age, have your blood pressure checked every 3-5 years. If you are 61 years of age or older, have your blood pressure checked every year. You should have your blood pressure measured twice--once when you are at a hospital or clinic, and once when you are not at a hospital or clinic. Record the average of the two measurements. To check your blood pressure when you are not at a hospital or clinic, you can use:  An automated blood pressure machine at a pharmacy.  A home blood pressure monitor.  If you are between 45 years and 39 years old, ask your health care provider if you should take aspirin to prevent strokes.  Have regular diabetes screenings. This involves taking a blood sample to check your fasting blood sugar level.  If you are at a normal weight and have a low risk for diabetes,  have this test once every three years after 40 years of age.  If you are overweight and have a high risk for diabetes, consider being tested at a younger age or more often. PREVENTING INFECTION  Hepatitis B  If you have a higher risk for hepatitis B, you should be screened for this virus. You are considered at high risk for hepatitis B if:  You were born in a country where hepatitis B is common. Ask your health care provider which countries are considered high risk.  Your parents were born in a high-risk country, and you have not been immunized against hepatitis B (hepatitis B vaccine).  You have HIV or AIDS.  You use needles to inject street drugs.  You live with someone who has hepatitis B.  You have had sex with someone who has hepatitis B.  You get hemodialysis treatment.  You take certain medicines for conditions, including cancer, organ transplantation, and autoimmune conditions. Hepatitis C  Blood testing is recommended for:  Everyone born from [birth year] through 1965.  Anyone with known risk factors for hepatitis C. Sexually transmitted infections (STIs)  You should be screened for sexually transmitted infections (STIs) including gonorrhea and chlamydia if:  You are sexually active and are younger than 40 years of age.  You are older than 40 years of age and your health care provider tells you that you are at risk for this type of infection.  Your sexual activity has changed since you were last screened and you are at an increased risk for chlamydia or gonorrhea. Ask your health care provider if you are at risk.  If you do not have HIV, but are at risk, it may be recommended that you take a prescription medicine daily to prevent HIV infection. This is called pre-exposure prophylaxis (PrEP). You are considered at risk if:  You are sexually active and do not regularly use condoms or know the HIV status of your partner(s).  You take drugs by injection.  You are sexually  active with a partner who has HIV. Talk with your health care provider about whether you are at high risk of being infected with HIV. If you choose to begin PrEP, you should first be tested for HIV. You should then be tested every 3 months for as long as you are taking PrEP.  PREGNANCY   If you are premenopausal and you may become pregnant, ask your health care provider about preconception counseling.  If you may  become pregnant, take 400 to 800 micrograms (mcg) of folic acid every day.  If you want to prevent pregnancy, talk to your health care provider about birth control (contraception). OSTEOPOROSIS AND MENOPAUSE   Osteoporosis is a disease in which the bones lose minerals and strength with aging. This can result in serious bone fractures. Your risk for osteoporosis can be identified using a bone density scan.  If you are 59 years of age or older, or if you are at risk for osteoporosis and fractures, ask your health care provider if you should be screened.  Ask your health care provider whether you should take a calcium or vitamin D supplement to lower your risk for osteoporosis.  Menopause may have certain physical symptoms and risks.  Hormone replacement therapy may reduce some of these symptoms and risks. Talk to your health care provider about whether hormone replacement therapy is right for you.  HOME CARE INSTRUCTIONS   Schedule regular health, dental, and eye exams.  Stay current with your immunizations.   Do not use any tobacco products including cigarettes, chewing tobacco, or electronic cigarettes.  If you are pregnant, do not drink alcohol.  If you are breastfeeding, limit how much and how often you drink alcohol.  Limit alcohol intake to no more than 1 drink per day for nonpregnant women. One drink equals 12 ounces of beer, 5 ounces of wine, or 1 ounces of hard liquor.  Do not use street drugs.  Do not share needles.  Ask your health care provider for help if  you need support or information about quitting drugs.  Tell your health care provider if you often feel depressed.  Tell your health care provider if you have ever been abused or do not feel safe at home.   This information is not intended to replace advice given to you by your health care provider. Make sure you discuss any questions you have with your health care provider.   Document Released: 06/02/2011 Document Revised: 12/08/2014 Document Reviewed: 10/19/2013 Elsevier Interactive Patient Education 2016 [employer].   Make certain to add calcium and vit d to your supplements. I will call you with results and we will make follow dependent on results. If all normal 1 year follow up, unless you need me sooner.  Try myfitnesspal APP and google "daily caloric intake calculator"

## 2016-01-10 ENCOUNTER — Telehealth: Payer: Self-pay | Admitting: Family Medicine

## 2016-01-10 ENCOUNTER — Encounter: Payer: Self-pay | Admitting: Family Medicine

## 2016-01-10 DIAGNOSIS — E559 Vitamin D deficiency, unspecified: Secondary | ICD-10-CM | POA: Insufficient documentation

## 2016-01-10 DIAGNOSIS — Z7689 Persons encountering health services in other specified circumstances: Secondary | ICD-10-CM | POA: Insufficient documentation

## 2016-01-10 MED ORDER — CHOLECALCIFEROL 1.25 MG (50000 UT) PO CAPS: 50000.0000 [IU] | ORAL_CAPSULE | ORAL | Status: DC

## 2016-01-10 NOTE — Telephone Encounter (Signed)
Left message for patient to return call to review labs and instructions. 

## 2016-01-10 NOTE — Telephone Encounter (Signed)
Spoke with patient reviewed lab results and instructions. Patient verbalized understanding. 

## 2016-01-10 NOTE — Telephone Encounter (Signed)
Please call pt: - her Vit D level is low (17). She will need prescribed supplementation for 12 weeks, then vit D recheck (by lab only-order placed). After completion of Vit d prescribed she should be encouraged to take at least 800 u daily of OTC. - Vit D is important for bone health, and also leads to increased fatigue/myalgias.

## 2017-05-03 ENCOUNTER — Encounter (HOSPITAL_BASED_OUTPATIENT_CLINIC_OR_DEPARTMENT_OTHER): Payer: Self-pay | Admitting: *Deleted

## 2017-05-03 ENCOUNTER — Emergency Department (HOSPITAL_BASED_OUTPATIENT_CLINIC_OR_DEPARTMENT_OTHER)
Admission: EM | Admit: 2017-05-03 | Discharge: 2017-05-04 | Disposition: A | Discharge status: Home / Self Care | Payer: 59 | Attending: Emergency Medicine | Admitting: Emergency Medicine

## 2017-05-03 DIAGNOSIS — R1031 Right lower quadrant pain: Secondary | ICD-10-CM | POA: Diagnosis present

## 2017-05-03 DIAGNOSIS — N12 Tubulo-interstitial nephritis, not specified as acute or chronic: Secondary | ICD-10-CM

## 2017-05-03 LAB — URINALYSIS, ROUTINE W REFLEX MICROSCOPIC
Bilirubin Urine: NEGATIVE
Glucose, UA: NEGATIVE mg/dL
Ketones, ur: NEGATIVE mg/dL
NITRITE: NEGATIVE
PH: 7.5 (ref 5.0–8.0)
Protein, ur: 30 mg/dL — AB
SPECIFIC GRAVITY, URINE: 1.013 (ref 1.005–1.030)

## 2017-05-03 LAB — PREGNANCY, URINE: PREG TEST UR: NEGATIVE

## 2017-05-03 LAB — URINALYSIS, MICROSCOPIC (REFLEX)

## 2017-05-03 MED ORDER — PHENAZOPYRIDINE HCL 200 MG PO TABS: 200.0000 mg | ORAL_TABLET | Freq: Three times a day (TID) | ORAL | 0 refills | Status: DC

## 2017-05-03 MED ORDER — CEPHALEXIN 500 MG PO CAPS: 500.0000 mg | ORAL_CAPSULE | Freq: Four times a day (QID) | ORAL | 0 refills | Status: AC

## 2017-05-03 MED ORDER — CEPHALEXIN 250 MG PO CAPS
500.0000 mg | ORAL_CAPSULE | Freq: Once | ORAL | Status: AC
  Administered 2017-05-03: 500 mg via ORAL
  Filled 2017-05-03: qty 2

## 2017-05-03 MED ORDER — ACETAMINOPHEN 325 MG PO TABS
650.0000 mg | ORAL_TABLET | Freq: Once | ORAL | Status: AC
  Administered 2017-05-03: 650 mg via ORAL
  Filled 2017-05-03: qty 2

## 2017-05-03 MED ORDER — ONDANSETRON 4 MG PO TBDP
4.0000 mg | ORAL_TABLET | Freq: Once | ORAL | Status: AC
  Administered 2017-05-03: 4 mg via ORAL
  Filled 2017-05-03: qty 1

## 2017-05-03 NOTE — ED Triage Notes (Signed)
Urinary frequency, urgency dysuria x 2-3 days

## 2017-05-03 NOTE — Discharge Instructions (Signed)
Your urinalysis showed signs of infection. Given your back pain and fever, I would like to treat you for a kidney infection.   Please take antibiotic as prescribed. Pyridium will help with bladder discomfort.   Your symptoms should start to resolve within 48 hours of being on antibiotics.  Return for worsening symptoms or fever

## 2017-05-03 NOTE — ED Provider Notes (Signed)
[facility] Note   CSN: [id number] Arrival date & time: 05/03/17  1753  By signing my name below, I, [name], attest that this documentation has been prepared under the direction and in the presence of Sharen Hecklaudia Wisdom Seybold, PA-C. Electronically Signed: [name], ED Scribe. 05/03/17. 11:34 PM.  History   Chief Complaint Chief Complaint  Patient presents with  . Urinary Tract Infection   The history is provided by the patient. No language interpreter was used.   HPI Comments: [name] is a 41 y.o. female who presents to the Emergency Department complaining of 6/10 intermittent, gradually worsening, "cramping" pain in lower abdomen and central back worse on R>L with associated fever, urgency and frequency that began 2-3 days ago. The pt has PMHx of nephrolithiasis that she was able to pass. She has had UTIs in the past and reports these symptoms most similar to UTI and not as bad as kidney stone. She denies the recent use of antibiotics. The pt has no associated hematuria,vaginal discharge or irritation.    Past Medical History:  Diagnosis Date  . Anxiety   . Kidney stones     Patient Active Problem List   Diagnosis Date Noted  . Vitamin D deficiency 01/10/2016  . Encounter to establish care with new doctor 01/10/2016  . BMI 60.0-69.9, adult (HCC) 01/09/2016  . Encounter for preventive health examination 01/09/2016  . IUD contraception 03/30/2013    Past Surgical History:  Procedure Laterality Date  . NO PAST SURGERIES      OB History    Gravida Para Term Preterm AB Living   2 2 2     2    SAB TAB Ectopic Multiple Live Births           2       Home Medications    Prior to Admission medications   Medication Sig Start Date End Date Taking? Authorizing Provider  cephALEXin (KEFLEX) 500 MG capsule Take 1 capsule (500 mg total) by mouth 4 (four) times daily. 05/03/17 05/17/17  Liberty HandyGibbons, Rinoa Garramone J, PA-C  Cholecalciferol 50000 units  capsule Take 1 capsule (50,000 Units total) by mouth 2 (two) times a week. 01/10/16   Kuneff, Renee A, DO  phenazopyridine (PYRIDIUM) 200 MG tablet Take 1 tablet (200 mg total) by mouth 3 (three) times daily. 05/03/17   Liberty HandyGibbons, Lariza Cothron J, PA-C    Family History Family History  Problem Relation Age of Onset  . Cancer Paternal Grandfather        pancreatic  . Alcohol abuse Paternal Grandfather   . Arthritis Paternal Grandfather   . COPD Paternal Grandfather   . Early death Paternal Grandfather   . Cancer Paternal Grandmother        unknown, metz  . Arthritis Paternal Grandmother   . COPD Paternal Grandmother   . Early death Paternal Grandmother   . Arthritis Father   . Mental retardation Father   . Arthritis Maternal Grandmother   . COPD Maternal Grandfather   . Heart disease Maternal Grandfather   . Cancer Sister [id number]       ovarian    Social History Social History  Substance Use Topics  . Smoking status: Never Smoker  . Smokeless tobacco: Never Used  . Alcohol use No     Allergies   Patient has no known allergies.   Review of Systems Review of Systems  Constitutional: Positive for fever.  Gastrointestinal: Positive for abdominal pain.  Genitourinary: Positive for dysuria (cramping after peeing),  frequency and urgency. Negative for hematuria.       No itching     Physical Exam Updated Vital Signs BP (!) 163/102 (BP Location: Right Arm)   Pulse 97   Temp 99.1 F (37.3 C) (Oral)   Resp 18   Ht 5\' 7"  (1.702 m)   Wt (!) 192.8 kg (425 lb)   SpO2 100%   BMI 66.56 kg/m   Physical Exam  Constitutional: She is oriented to person, place, and time. She appears well-developed and well-nourished. No distress.  NAD.  HENT:  Head: Normocephalic and atraumatic.  Right Ear: External ear normal.  Left Ear: External ear normal.  Nose: Nose normal.  Eyes: Conjunctivae and EOM are normal. No scleral icterus.  Neck: Normal range of motion. Neck supple.  Cardiovascular:  Normal rate, regular rhythm and normal heart sounds.   No murmur heard. Pulmonary/Chest: Effort normal and breath sounds normal. She has no wheezes.  Abdominal: There is tenderness in the suprapubic area. There is CVA tenderness.  Suprapubic abdominal tenderness CVA tenderness  Musculoskeletal: Normal range of motion. She exhibits no deformity.  Neurological: She is alert and oriented to person, place, and time.  Skin: Skin is warm and dry. Capillary refill takes less than 2 seconds.  Psychiatric: She has a normal mood and affect. Her behavior is normal. Judgment and thought content normal.  Nursing note and vitals reviewed.    ED Treatments / Results   DIAGNOSTIC STUDIES: Oxygen Saturation is 98% on RA, normal by my interpretation.   COORDINATION OF CARE: 11:27 PM-Discussed next steps with pt. Pt verbalized understanding and is agreeable with the plan.   Labs (all labs ordered are listed, but only abnormal results are displayed) Labs Reviewed  URINALYSIS, ROUTINE W REFLEX MICROSCOPIC - Abnormal; Notable for the following:       Result Value   APPearance CLOUDY (*)    Hgb urine dipstick MODERATE (*)    Protein, ur 30 (*)    Leukocytes, UA LARGE (*)    All other components within normal limits  URINALYSIS, MICROSCOPIC (REFLEX) - Abnormal; Notable for the following:    Bacteria, UA FEW (*)    Squamous Epithelial / LPF 6-30 (*)    All other components within normal limits  URINE CULTURE  PREGNANCY, URINE    EKG  EKG Interpretation None       Radiology No results found.  Procedures Procedures (including critical care time)  Medications Ordered in ED Medications  acetaminophen (TYLENOL) tablet 650 mg (650 mg Oral Given 05/03/17 1822)  ondansetron (ZOFRAN-ODT) disintegrating tablet 4 mg (4 mg Oral Given 05/03/17 1822)  cephALEXin (KEFLEX) capsule 500 mg (500 mg Oral Given 05/03/17 2350)     Initial Impression / Assessment and Plan / ED Course  I have reviewed the  triage vital signs and the nursing notes.  Pertinent labs & imaging results that were available during my care of the patient were reviewed by me and considered in my medical decision making (see chart for details).      41 year old female with pertinent past medical history of UTIs and nephrolithiasis presents to ED with urinary symptoms, fever, bilateral low back pain 2-3 days. On exam patient is nontoxic appearing, she has low-grade fever and suprapubic and CVA tenderness. History and physical exam was suggestive of pyelonephritis.  Patient is HD stable and good candidate for outpatient antibiotic therapy. We'll discharge with antibiotics and Pyridium for symptoms. Urine culture sent. ED return precautions given. Patient is aware  of symptoms that would warrant return to the ED for further evaluation.  Final Clinical Impressions(s) / ED Diagnoses   Final diagnoses:  Pyelonephritis    New Prescriptions Discharge Medication List as of 05/03/2017 11:39 PM    START taking these medications   Details  cephALEXin (KEFLEX) 500 MG capsule Take 1 capsule (500 mg total) by mouth 4 (four) times daily., Starting Sun 05/03/2017, Until Sun 05/17/2017, Print    phenazopyridine (PYRIDIUM) 200 MG tablet Take 1 tablet (200 mg total) by mouth 3 (three) times daily., Starting Sun 05/03/2017, Print       I personally performed the services described in this documentation, which was scribed in my presence. The recorded information has been reviewed and is accurate.     Liberty Handy, PA-C 05/04/17 0113    Tegeler, Canary Brim, MD 05/04/17 [phone]

## 2017-05-06 LAB — URINE CULTURE: Culture: 80000 — AB

## 2017-05-07 ENCOUNTER — Telehealth: Payer: Self-pay | Admitting: *Deleted

## 2017-05-07 NOTE — Telephone Encounter (Signed)
Post ED Visit - Positive Culture Follow-up  Culture report reviewed by antimicrobial stewardship pharmacist:  []  Enzo BiNathan Batchelder, Pharm.D. []  Celedonio MiyamotoJeremy Frens, Pharm.D., BCPS AQ-ID [x]  Garvin FilaMike Maccia, Pharm.D., BCPS []  Georgina PillionElizabeth Martin, [address]Ph[address] BCPS []  DarlingtonMinh Pham, [address]Pharm[address]D., BCPS, AAHIVP []  Estella HuskMichelle Turner, Pharm.D., BCPS, AAHIVP []  Lysle Pearlachel Rumbarger, PharmD, BCPS []  Casilda Carlsaylor Stone, PharmD, BCPS []  Pollyann SamplesAndy Johnston, PharmD, BCPS  Positive urine culture Treated with Cephalexin, organism sensitive to the same and no further patient follow-up is required at this time.  Virl AxeRobertson, Avonna Iribe Talley 05/07/2017, 11:00 AM

## 2017-06-10 ENCOUNTER — Encounter: Payer: Self-pay | Admitting: Family Medicine

## 2017-06-10 ENCOUNTER — Ambulatory Visit (INDEPENDENT_AMBULATORY_CARE_PROVIDER_SITE_OTHER): Payer: 59 | Admitting: Family Medicine

## 2017-06-10 VITALS — BP 139/87 | HR 81 | Temp 98.3°F | Resp 20 | Wt >= 6400 oz

## 2017-06-10 DIAGNOSIS — R8290 Unspecified abnormal findings in urine: Secondary | ICD-10-CM | POA: Diagnosis not present

## 2017-06-10 DIAGNOSIS — R109 Unspecified abdominal pain: Secondary | ICD-10-CM

## 2017-06-10 LAB — POC URINALSYSI DIPSTICK (AUTOMATED)
Bilirubin, UA: NEGATIVE
GLUCOSE UA: NEGATIVE
KETONES UA: NEGATIVE
Nitrite, UA: NEGATIVE
PROTEIN UA: 30
Urobilinogen, UA: 0.2 E.U./dL
pH, UA: 6.5 (ref 5.0–8.0)

## 2017-06-10 MED ORDER — KETOROLAC TROMETHAMINE 60 MG/2ML IM SOLN
60.0000 mg | Freq: Once | INTRAMUSCULAR | Status: AC
  Administered 2017-06-10: 60 mg via INTRAMUSCULAR

## 2017-06-10 MED ORDER — CEPHALEXIN 500 MG PO CAPS: 500.0000 mg | ORAL_CAPSULE | Freq: Four times a day (QID) | ORAL | 0 refills | Status: DC

## 2017-06-10 MED ORDER — CEFTRIAXONE SODIUM 1 G IJ SOLR
1.0000 g | Freq: Once | INTRAMUSCULAR | Status: AC
  Administered 2017-06-10: 1 g via INTRAMUSCULAR

## 2017-06-10 MED ORDER — NAPROXEN 500 MG PO TABS: 500.0000 mg | ORAL_TABLET | Freq: Two times a day (BID) | ORAL | 0 refills | Status: DC

## 2017-06-10 NOTE — Patient Instructions (Addendum)
Start naproxen (anti-inflammatory- tomorrow, every 12 hours with food for 5 days, then only as needed for pain  Start keflex tomorrow, this is the antibiotic, it is every 6 hours for 6 days.   I will call you with the lab results. Follow up in 1 week. If needed by labs or you are not recovering will need to see sooner and image.    Rest. Hydrate. WATER!!! Flush those kidneys    Pyelonephritis, Adult Pyelonephritis is a kidney infection. The kidneys are organs that help clean your blood by moving waste out of your blood and into your pee (urine). This infection can happen quickly, or it can last for a long time. In most cases, it clears up with treatment and does not cause other problems. Follow these instructions at home: Medicines  Take over-the-counter and prescription medicines only as told by your doctor.  Take your antibiotic medicine as told by your doctor. Do not stop taking the medicine even if you start to feel better. General instructions  Drink enough fluid to keep your pee clear or pale yellow.  Avoid caffeine, tea, and carbonated drinks.  Pee (urinate) often. Avoid holding in pee for long periods of time.  Pee before and after sex.  After pooping (having a bowel movement), women should wipe from front to back. Use each tissue only once.  Keep all follow-up visits as told by your doctor. This is important. Contact a doctor if:  You do not feel better after 2 days.  Your symptoms get worse.  You have a fever. Get help right away if:  You cannot take your medicine or drink fluids as told.  You have chills and shaking.  You throw up (vomit).  You have very bad pain in your side (flank) or back.  You feel very weak or you pass out (faint). This information is not intended to replace advice given to you by your health care provider. Make sure you discuss any questions you have with your health care provider. Document Released: 12/25/2004 Document Revised:  04/24/2016 Document Reviewed: 03/12/2015 Elsevier Interactive Patient Education  [employer]2018 [employer]

## 2017-06-10 NOTE — Progress Notes (Signed)
Teresa Osborne , 1976-05-18, 41 y.o., female MRN: [id number] Patient Care Team    Relationship Specialty Notifications Start End  Ma Hillock, DO PCP - General Family Medicine  01/09/16     Chief Complaint  Patient presents with  . Back Pain    left flank     Subjective: Pt presents for an OV with complaints of Left flank pain of 1-2 days duration.  Associated symptoms include recent pyelonephritis in which patient cannot completely antibiotic treatment 05/03/2017. Patient was seen in the emergency room on 05/03/2017 and diagnosed with pyelonephritis provided with Keflex seven-day course, which she states she did not finish the antibiotics She started feeling better. She does state she knows better than to do that. Her urine culture was positive for protease sensitive to Keflex. Patient has a significant medical history of left nephrolithiasis, which she states he passed a kidney stone in 2013 after use of Flomax. Currently patient states that she started to have repeat symptoms about 1-2 days ago, but last night it turn for the worst. She now is having repeated left flank pain. She denies urinary frequency or dysuria. She feels the flank pain is intermittent. She is eating and drinking well. She denies fever or chills, nausea or vomiting.  Depression screen PHQ 2/9 01/09/2016  Decreased Interest 0  Down, Depressed, Hopeless 0  PHQ - 2 Score 0    No Known Allergies Social History  Substance Use Topics  . Smoking status: Never Smoker  . Smokeless tobacco: Never Used  . Alcohol use No   Past Medical History:  Diagnosis Date  . Anxiety   . Kidney stones    Past Surgical History:  Procedure Laterality Date  . NO PAST SURGERIES     Family History  Problem Relation Age of Onset  . Cancer Paternal Grandfather        pancreatic  . Alcohol abuse Paternal Grandfather   . Arthritis Paternal Grandfather   . COPD Paternal Grandfather   . Early death Paternal Grandfather   .  Cancer Paternal Grandmother        unknown, metz  . Arthritis Paternal Grandmother   . COPD Paternal Grandmother   . Early death Paternal Grandmother   . Arthritis Father   . Mental retardation Father   . Arthritis Maternal Grandmother   . COPD Maternal Grandfather   . Heart disease Maternal Grandfather   . Cancer Sister 36       ovarian   Allergies as of 06/10/2017   No Known Allergies     Medication List       Accurate as of 06/10/17  9:56 AM. Always use your most recent med list.          cephALEXin 500 MG capsule Commonly known as:  KEFLEX Take 500 mg by mouth 4 (four) times daily.   Cholecalciferol 50000 units capsule Take 1 capsule (50,000 Units total) by mouth 2 (two) times a week.   phenazopyridine 200 MG tablet Commonly known as:  PYRIDIUM Take 1 tablet (200 mg total) by mouth 3 (three) times daily.       All past medical history, surgical history, allergies, family history, immunizations andmedications were updated in the EMR today and reviewed under the history and medication portions of their EMR.     ROS: Negative, with the exception of above mentioned in HPI   Objective:  BP 139/87 (BP Location: Right Arm, Patient Position: Sitting, Cuff Size: Large)   Pulse 81  Temp 98.3 F (36.8 C)   Resp 20   Wt (!) 423 lb (191.9 kg)   SpO2 98%   BMI 66.25 kg/m  Body mass index is 66.25 kg/m. Gen: Afebrile. No acute distress. Nontoxic in appearance, well developed, well nourished. Pleasant, obese Caucasian female HENT: AT. [place]. MMM, no oral lesions. Eyes:Pupils Equal Round Reactive to light, Extraocular movements intact,  Conjunctiva without redness, discharge or icterus. CV: RRR   Abd: Soft. Obese. Mild tenderness to deep palpation left abdomen and flank . No rebound or guarding  MSK: No CVA tenderness bilaterally. Neuro:  Normal gait. PERLA. EOMi. Alert. Oriented x3  No exam data present No results found. Results for orders placed or performed in visit  on 06/10/17 (from the past 24 hour(s))  POCT Urinalysis Dipstick (Automated)     Status: Abnormal   Collection Time: 06/10/17  9:54 AM  Result Value Ref Range   Color, UA yellow    Clarity, UA cloudy    Glucose, UA neg    Bilirubin, UA neg    Ketones, UA neg    Spec Grav, UA <=1.005 (A) 1.010 - 1.025   Blood, UA mod    pH, UA 6.5 5.0 - 8.0   Protein, UA 30    Urobilinogen, UA 0.2 0.2 or 1.0 E.U./dL   Nitrite, UA neg    Leukocytes, UA Large (3+) (A) Negative    Assessment/Plan: Daneshia Tavano is a 41 y.o. female present for OV for  Flank pain/UTI/ - Point-of-care urine positive for large leukocytes and moderate blood. Likely repeat infection from prior pyelonephritis secondary to incomplete use of antibiotic. Given that she had resolution with antibiotic, I would not think that this was a kidney stone however cannot be certain. Discussed with patient to hydrate with water, naproxen 500 mg twice a day for 5 days and then as needed for pain starting tomorrow, Toradol injection provided today.. Rocephin I am provided today, with Keflex start tomorrow total course 7 days. - Urine Culture - CBC w/Diff - Comp Met (CMET) - cephALEXin (KEFLEX) 500 MG capsule; Take 1 capsule (500 mg total) by mouth 4 (four) times daily.  Dispense: 24 capsule; Refill: 0 - naproxen (NAPROSYN) 500 MG tablet; Take 1 tablet (500 mg total) by mouth 2 (two) times daily with a meal.  Dispense: 30 tablet; Refill: 0 - ketorolac (TORADOL) injection 60 mg; Inject 2 mLs (60 mg total) into the muscle once. - cefTRIAXone (ROCEPHIN) injection 1 g; Inject 1 g into the muscle once. - If worsening we need to be seen sooner, and would image to rule out kidney stone. If not improved within 1-2 weeks would want to see her again. Otherwise follow-up when necessary  Reviewed expectations re: course of current medical issues.  Discussed self-management of symptoms.  Outlined signs and symptoms indicating need for more acute  intervention.  Patient verbalized understanding and all questions were answered.  Patient received an After-Visit Summary.    Orders Placed This Encounter  Procedures  . POCT Urinalysis Dipstick (Automated)     Note is dictated utilizing voice recognition software. Although note has been proof read prior to signing, occasional typographical errors still can be missed. If any questions arise, please do not hesitate to call for verification.   electronically signed by:  Howard Pouch, DO  [place]

## 2017-06-11 ENCOUNTER — Ambulatory Visit: Payer: BLUE CROSS/BLUE SHIELD | Admitting: Family Medicine

## 2017-06-11 ENCOUNTER — Telehealth: Payer: Self-pay | Admitting: Family Medicine

## 2017-06-11 DIAGNOSIS — R109 Unspecified abdominal pain: Secondary | ICD-10-CM | POA: Insufficient documentation

## 2017-06-11 LAB — CBC WITH DIFFERENTIAL/PLATELET
BASOS ABS: 0 10*3/uL (ref 0.0–0.2)
Basos: 0 %
EOS (ABSOLUTE): 0.2 10*3/uL (ref 0.0–0.4)
Eos: 2 %
Hematocrit: 36.2 % (ref 34.0–46.6)
Hemoglobin: 12.2 g/dL (ref 11.1–15.9)
Immature Grans (Abs): 0 10*3/uL (ref 0.0–0.1)
Immature Granulocytes: 0 %
LYMPHS ABS: 1.6 10*3/uL (ref 0.7–3.1)
Lymphs: 18 %
MCH: 29.8 pg (ref 26.6–33.0)
MCHC: 33.7 g/dL (ref 31.5–35.7)
MCV: 89 fL (ref 79–97)
MONOCYTES: 6 %
MONOS ABS: 0.5 10*3/uL (ref 0.1–0.9)
Neutrophils Absolute: 6.7 10*3/uL (ref 1.4–7.0)
Neutrophils: 74 %
Platelets: 361 10*3/uL (ref 150–379)
RBC: 4.09 x10E6/uL (ref 3.77–5.28)
RDW: 14.1 % (ref 12.3–15.4)
WBC: 9 10*3/uL (ref 3.4–10.8)

## 2017-06-11 LAB — COMPREHENSIVE METABOLIC PANEL
ALK PHOS: 72 IU/L (ref 39–117)
ALT: 10 IU/L (ref 0–32)
AST: 12 IU/L (ref 0–40)
Albumin/Globulin Ratio: 1.3 (ref 1.2–2.2)
Albumin: 3.9 g/dL (ref 3.5–5.5)
BUN/Creatinine Ratio: 11 (ref 9–23)
BUN: 10 mg/dL (ref 6–24)
Bilirubin Total: 0.3 mg/dL (ref 0.0–1.2)
CO2: 19 mmol/L — AB (ref 20–29)
CREATININE: 0.88 mg/dL (ref 0.57–1.00)
Calcium: 8.8 mg/dL (ref 8.7–10.2)
Chloride: 105 mmol/L (ref 96–106)
GFR calc Af Amer: 94 mL/min/{1.73_m2} (ref >59)
GFR calc non Af Amer: 82 mL/min/{1.73_m2} (ref >59)
GLUCOSE: 88 mg/dL (ref 65–99)
Globulin, Total: 3 g/dL (ref 1.5–4.5)
Potassium: 4.4 mmol/L (ref 3.5–5.2)
Sodium: 139 mmol/L (ref 134–144)
Total Protein: 6.9 g/dL (ref 6.0–8.5)

## 2017-06-11 NOTE — Telephone Encounter (Signed)
Left detailed message with instructions and lab results on patient voice mail per DPR.

## 2017-06-11 NOTE — Telephone Encounter (Signed)
Please call pt: - her labs are normal. Still waiting on culture, this will take a few days.  - continue medications as we discussed during her visit. Make sure to complete her abx.  - Follow up in 1-2  week if not resolved, sooner if worsening and we will image.

## 2017-06-12 ENCOUNTER — Telehealth: Payer: Self-pay | Admitting: Family Medicine

## 2017-06-12 LAB — URINE CULTURE

## 2017-06-12 NOTE — Telephone Encounter (Signed)
Left detailed message with results and instructions on patient voice mail per DPR 

## 2017-06-12 NOTE — Telephone Encounter (Signed)
Please call patient: Her urine culture didn't grow any specific bacterial load. This is likely secondary to her taking the majority of the antibiotics, but not to complete course.  - I recommend she continue the antibiotics to completion. Follow-up next week if symptoms are not completely resolved, sooner if worsening.

## 2017-06-29 ENCOUNTER — Telehealth: Payer: Self-pay | Admitting: Family Medicine

## 2017-06-29 NOTE — Telephone Encounter (Signed)
Left message for pt to call back  °

## 2017-06-29 NOTE — Telephone Encounter (Signed)
Nurse Assessment  Nurse: Earlene PlaterWallace, RN, Lupita Leashonna Date/Time Lamount Cohen(Eastern Time): 06/29/2017 3:01:46 PM  Confirm and document reason for call. If symptomatic, describe symptoms. ---Caller states abd, side and back pain bothering her; possibly kidney stone; last 6 yrs ago. Treated couple of weeks ago for kidney infection; was on abx. Abominal pain and nausea started again on Wednesday. No issues urinating and no pain or burning.  Does the patient have any new or worsening symptoms? ---Yes  Will a triage be completed? ---Yes  Related visit to physician within the last 2 weeks? ---No  Does the PT have any chronic conditions? (i.e. diabetes, asthma, etc.) ---No  Is the patient pregnant or possibly pregnant? (Ask all females between the ages of 7812-55) ---No  Is this a behavioral health or substance abuse call? ---No     Guidelines    Guideline Title Affirmed Question Affirmed Notes  Abdominal Pain - Upper [1] MODERATE pain (e.g., interferes with normal activities) AND [2] comes and goes (cramps) AND [3] present > 24 hours (Exception: pain with Vomiting or Diarrhea - see that Guideline)    Final Disposition User   See Physician within 24 Hours Earlene PlaterWallace, RN, [employer]    Referrals  REFERRED TO PCP OFFICE   Disagree/Comply: Danella Maiersomply

## 2017-06-29 NOTE — Telephone Encounter (Signed)
Please advise. Thanks.  

## 2017-06-29 NOTE — Telephone Encounter (Signed)
Ok for ov

## 2017-07-01 NOTE — Telephone Encounter (Signed)
Left message for pt to call back  °

## 2017-09-06 ENCOUNTER — Emergency Department (HOSPITAL_COMMUNITY): Payer: 59

## 2017-09-06 ENCOUNTER — Encounter (HOSPITAL_COMMUNITY): Payer: Self-pay | Admitting: Emergency Medicine

## 2017-09-06 ENCOUNTER — Emergency Department (HOSPITAL_COMMUNITY)
Admission: EM | Admit: 2017-09-06 | Discharge: 2017-09-06 | Disposition: A | Discharge status: Home / Self Care | Payer: 59 | Attending: Emergency Medicine | Admitting: Emergency Medicine

## 2017-09-06 DIAGNOSIS — N12 Tubulo-interstitial nephritis, not specified as acute or chronic: Secondary | ICD-10-CM | POA: Diagnosis not present

## 2017-09-06 DIAGNOSIS — Z79899 Other long term (current) drug therapy: Secondary | ICD-10-CM | POA: Insufficient documentation

## 2017-09-06 DIAGNOSIS — N200 Calculus of kidney: Secondary | ICD-10-CM | POA: Insufficient documentation

## 2017-09-06 DIAGNOSIS — R1032 Left lower quadrant pain: Secondary | ICD-10-CM | POA: Diagnosis present

## 2017-09-06 LAB — CBC WITH DIFFERENTIAL/PLATELET
Basophils Absolute: 0 10*3/uL (ref 0.0–0.1)
Basophils Relative: 0 %
EOS PCT: 4 %
Eosinophils Absolute: 0.4 10*3/uL (ref 0.0–0.7)
HEMATOCRIT: 32.7 % — AB (ref 36.0–46.0)
Hemoglobin: 10.8 g/dL — ABNORMAL LOW (ref 12.0–15.0)
LYMPHS PCT: 17 %
Lymphs Abs: 1.8 10*3/uL (ref 0.7–4.0)
MCH: 29.3 pg (ref 26.0–34.0)
MCHC: 33 g/dL (ref 30.0–36.0)
MCV: 88.6 fL (ref 78.0–100.0)
MONO ABS: 0.8 10*3/uL (ref 0.1–1.0)
MONOS PCT: 8 %
NEUTROS ABS: 7.5 10*3/uL (ref 1.7–7.7)
Neutrophils Relative %: 71 %
Platelets: 378 10*3/uL (ref 150–400)
RBC: 3.69 MIL/uL — ABNORMAL LOW (ref 3.87–5.11)
RDW: 13.7 % (ref 11.5–15.5)
WBC: 10.6 10*3/uL — ABNORMAL HIGH (ref 4.0–10.5)

## 2017-09-06 LAB — I-STAT CHEM 8, ED
BUN: 10 mg/dL (ref 6–20)
CREATININE: 1.1 mg/dL — AB (ref 0.44–1.00)
Calcium, Ion: 1.13 mmol/L — ABNORMAL LOW (ref 1.15–1.40)
Chloride: 101 mmol/L (ref 101–111)
GLUCOSE: 87 mg/dL (ref 65–99)
HEMATOCRIT: 31 % — AB (ref 36.0–46.0)
Hemoglobin: 10.5 g/dL — ABNORMAL LOW (ref 12.0–15.0)
POTASSIUM: 4.1 mmol/L (ref 3.5–5.1)
Sodium: 136 mmol/L (ref 135–145)
TCO2: 28 mmol/L (ref 22–32)

## 2017-09-06 LAB — URINALYSIS, ROUTINE W REFLEX MICROSCOPIC
Bilirubin Urine: NEGATIVE
GLUCOSE, UA: NEGATIVE mg/dL
KETONES UR: NEGATIVE mg/dL
NITRITE: NEGATIVE
PH: 6 (ref 5.0–8.0)
PROTEIN: NEGATIVE mg/dL
Specific Gravity, Urine: 1.011 (ref 1.005–1.030)

## 2017-09-06 LAB — POC URINE PREG, ED: Preg Test, Ur: NEGATIVE

## 2017-09-06 MED ORDER — SODIUM CHLORIDE 0.9 % IV BOLUS (SEPSIS)
1000.0000 mL | Freq: Once | INTRAVENOUS | Status: AC
  Administered 2017-09-06: 1000 mL via INTRAVENOUS

## 2017-09-06 MED ORDER — CEPHALEXIN 500 MG PO CAPS: 500.0000 mg | ORAL_CAPSULE | Freq: Four times a day (QID) | ORAL | 0 refills | Status: DC

## 2017-09-06 MED ORDER — KETOROLAC TROMETHAMINE 30 MG/ML IJ SOLN
30.0000 mg | Freq: Once | INTRAMUSCULAR | Status: AC
  Administered 2017-09-06: 30 mg via INTRAVENOUS
  Filled 2017-09-06: qty 1

## 2017-09-06 MED ORDER — DEXTROSE 5 % IV SOLN
1.0000 g | Freq: Once | INTRAVENOUS | Status: AC
  Administered 2017-09-06: 1 g via INTRAVENOUS
  Filled 2017-09-06: qty 10

## 2017-09-06 NOTE — ED Provider Notes (Signed)
[facility]-EMERGENCY DEPT Provider Note   CSN: [id number] Arrival date & time: 09/06/17  1258     History   Chief Complaint Chief Complaint  Patient presents with  . Flank Pain    HPI Kemani Heidel is a 41 y.o. female.  Patient is a 41 year old female who presents with left flank pain. She says for the last week she's had some achy pain to her left mid back. It's been getting worse over the last few days. It's radiating to her left lower abdomen.  She's had some urinary frequency but no burning on urination. No fevers. No nausea or vomiting. She does have a prior history of kidney stones one time in the past. She is able to pass this without intervention. She denies any vaginal complaints.  She's been using some eye program with some improvement in symptoms.      Past Medical History:  Diagnosis Date  . Anxiety   . Kidney stones     Patient Active Problem List   Diagnosis Date Noted  . Flank pain 06/11/2017  . Vitamin D deficiency 01/10/2016  . Encounter to establish care with new doctor 01/10/2016  . BMI 60.0-69.9, adult (HCC) 01/09/2016  . Encounter for preventive health examination 01/09/2016  . IUD contraception 03/30/2013    Past Surgical History:  Procedure Laterality Date  . NO PAST SURGERIES      OB History    Gravida Para Term Preterm AB Living   SAB TAB Ectopic Multiple Live Births           2       Home Medications    Prior to Admission medications   Medication Sig Start Date End Date Taking? Authorizing Provider  cephALEXin (KEFLEX) 500 MG capsule Take 1 capsule (500 mg total) by mouth 4 (four) times daily. 09/06/17   Rolan Bucco, MD  naproxen (NAPROSYN) 500 MG tablet Take 1 tablet (500 mg total) by mouth 2 (two) times daily with a meal. 06/10/17   Kuneff, Renee A, DO    Family History Family History  Problem Relation Age of Onset  . Cancer Paternal Grandfather        pancreatic  . Alcohol abuse Paternal Grandfather   .  Arthritis Paternal Grandfather   . COPD Paternal Grandfather   . Early death Paternal Grandfather   . Cancer Paternal Grandmother        unknown, metz  . Arthritis Paternal Grandmother   . COPD Paternal Grandmother   . Early death Paternal Grandmother   . Arthritis Father   . Mental retardation Father   . Arthritis Maternal Grandmother   . COPD Maternal Grandfather   . Heart disease Maternal Grandfather   . Cancer Sister 12       ovarian    Social History Social History  Substance Use Topics  . Smoking status: Never Smoker  . Smokeless tobacco: Never Used  . Alcohol use No     Allergies   Patient has no known allergies.   Review of Systems Review of Systems  Constitutional: Negative for chills, diaphoresis, fatigue and fever.  HENT: Negative for congestion, rhinorrhea and sneezing.   Eyes: Negative.   Respiratory: Negative for cough, chest tightness and shortness of breath.   Cardiovascular: Negative for chest pain and leg swelling.  Gastrointestinal: Positive for abdominal pain. Negative for blood in stool, diarrhea, nausea and vomiting.  Genitourinary: Positive for flank pain and frequency. Negative for  difficulty urinating and hematuria.  Musculoskeletal: Negative for arthralgias and back pain.  Skin: Negative for rash.  Neurological: Negative for dizziness, speech difficulty, weakness, numbness and headaches.     Physical Exam Updated Vital Signs BP (!) 145/70   Pulse 87   Temp 98.1 F (36.7 C) (Oral)   Resp 18   SpO2 100%   Physical Exam  Constitutional: She is oriented to person, place, and time. She appears well-developed and well-nourished.  HENT:  Head: Normocephalic and atraumatic.  Eyes: Pupils are equal, round, and reactive to light.  Neck: Normal range of motion. Neck supple.  Cardiovascular: Normal rate, regular rhythm and normal heart sounds.   Pulmonary/Chest: Effort normal and breath sounds normal. No respiratory distress. She has no  wheezes. She has no rales. She exhibits no tenderness.  Abdominal: Soft. Bowel sounds are normal. There is tenderness (Positive tenderness in left mid and lower abdomen and left CVA area.). There is no rebound and no guarding.  Musculoskeletal: Normal range of motion. She exhibits no edema.  Lymphadenopathy:    She has no cervical adenopathy.  Neurological: She is alert and oriented to person, place, and time.  Skin: Skin is warm and dry. No rash noted.  Psychiatric: She has a normal mood and affect.     ED Treatments / Results  Labs (all labs ordered are listed, but only abnormal results are displayed) Labs Reviewed  URINALYSIS, ROUTINE W REFLEX MICROSCOPIC - Abnormal; Notable for the following:       Result Value   APPearance CLOUDY (*)    Hgb urine dipstick SMALL (*)    Leukocytes, UA LARGE (*)    Bacteria, UA RARE (*)    Squamous Epithelial / LPF 6-30 (*)    All other components within normal limits  I-STAT CHEM 8, ED - Abnormal; Notable for the following:    Creatinine, Ser 1.10 (*)    Calcium, Ion 1.13 (*)    Hemoglobin 10.5 (*)    HCT 31.0 (*)    All other components within normal limits  URINE CULTURE  POC URINE PREG, ED    EKG  EKG Interpretation None       Radiology Ct Renal Stone Study  Result Date: 09/06/2017 CLINICAL DATA:  Right flank pain for several days EXAM: CT ABDOMEN AND PELVIS WITHOUT CONTRAST TECHNIQUE: Multidetector CT imaging of the abdomen and pelvis was performed following the standard protocol without IV contrast. COMPARISON:  None. FINDINGS: Lower chest: No acute abnormality. Hepatobiliary: No focal liver abnormality is seen. No gallstones, gallbladder wall thickening, or biliary dilatation. Pancreas: Unremarkable. No pancreatic ductal dilatation or surrounding inflammatory changes. Spleen: Normal in size without focal abnormality. Adrenals/Urinary Tract: Normal adrenal glands. 3.5 x 1.2 cm calculus in the right renal pelvis with moderate  hydronephrosis. Mild left perinephric stranding. No right hydronephrosis. No right urolithiasis. Normal bladder. Stomach/Bowel: Stomach is within normal limits. Appendix appears normal. No evidence of bowel wall thickening, distention, or inflammatory changes. Diverticulosis without evidence of diverticulitis. Vascular/Lymphatic: No significant vascular findings are present. No enlarged abdominal or pelvic lymph nodes. Reproductive: Uterus and bilateral adnexa are unremarkable. T-type IUD is noted within the uterus. Other: Small fat containing umbilical hernia. No abdominopelvic ascites. Musculoskeletal: No acute osseous abnormality. No lytic or sclerotic osseous lesion. Degenerative disc disease disc height loss at L5-S1. Bilateral facet arthropathy at L4-5. IMPRESSION: 1. 3.5 x 1.2 cm staghorn calculus in the right renal pelvis with moderate hydronephrosis with mild left perinephric stranding. Superimposed infection cannot be  excluded. Electronically Signed   By: Elige Ko   On: 09/06/2017 15:51    Procedures Procedures (including critical care time)  Medications Ordered in ED Medications  ketorolac (TORADOL) 30 MG/ML injection 30 mg (not administered)  cefTRIAXone (ROCEPHIN) 1 g in dextrose 5 % 50 mL IVPB (not administered)  sodium chloride 0.9 % bolus 1,000 mL (not administered)     Initial Impression / Assessment and Plan / ED Course  I have reviewed the triage vital signs and the nursing notes.  Pertinent labs & imaging results that were available during my care of the patient were reviewed by me and considered in my medical decision making (see chart for details).     Patient is a 41 year old female who presents with left flank pain. She has evidence of a urinary tract infection and on CT scan has some perinephric stranding which would be concerning for an early pyelonephritis. She has no vomiting. No fevers. She's otherwise well-appearing with stable vital signs. I feel that she can  be treated as an outpatient. She was given dose of IV Rocephin in the ED. I will start her on Keflex. Her urine was sent for culture. She was given strict return precautions. She has a staghorn calculus in the right kidney. She doesn't have any pain or signs of obstruction in this kidney. Her creatinine is mildly elevated. She does have an appointment with her PCP in one week and I advised her she'll need to have this rechecked. I also advised her that she doesn't follow up with urology regarding the staghorn calculus.   Final Clinical Impressions(s) / ED Diagnoses   Final diagnoses:  Pyelonephritis  Staghorn calculus    New Prescriptions New Prescriptions   CEPHALEXIN (KEFLEX) 500 MG CAPSULE    Take 1 capsule (500 mg total) by mouth 4 (four) times daily.     Rolan Bucco, MD 09/06/17 [phone]

## 2017-09-06 NOTE — ED Notes (Signed)
Pt informed about current wait. 

## 2017-09-06 NOTE — ED Notes (Signed)
Pt ambulatory and independent at discharge.  Verbalized understanding of discharge instructions 

## 2017-09-06 NOTE — ED Triage Notes (Signed)
Pt reports R flank pain for the past several day. Hx of kidney stones 6 years ago; pain is in same location, but not as bad. No dysuria or injury

## 2017-09-08 LAB — URINE CULTURE

## 2017-09-11 ENCOUNTER — Ambulatory Visit (INDEPENDENT_AMBULATORY_CARE_PROVIDER_SITE_OTHER): Payer: 59 | Admitting: Family Medicine

## 2017-09-11 ENCOUNTER — Telehealth: Payer: Self-pay | Admitting: Family Medicine

## 2017-09-11 ENCOUNTER — Encounter: Payer: Self-pay | Admitting: Family Medicine

## 2017-09-11 VITALS — BP 127/89 | HR 100 | Temp 97.5°F | Resp 20 | Ht 65.0 in | Wt >= 6400 oz

## 2017-09-11 DIAGNOSIS — N1330 Unspecified hydronephrosis: Secondary | ICD-10-CM

## 2017-09-11 DIAGNOSIS — R109 Unspecified abdominal pain: Secondary | ICD-10-CM | POA: Diagnosis not present

## 2017-09-11 DIAGNOSIS — N200 Calculus of kidney: Secondary | ICD-10-CM | POA: Insufficient documentation

## 2017-09-11 DIAGNOSIS — R319 Hematuria, unspecified: Secondary | ICD-10-CM | POA: Diagnosis not present

## 2017-09-11 DIAGNOSIS — R309 Painful micturition, unspecified: Secondary | ICD-10-CM | POA: Diagnosis not present

## 2017-09-11 LAB — POC URINALSYSI DIPSTICK (AUTOMATED)
BILIRUBIN UA: NEGATIVE
GLUCOSE UA: NEGATIVE
KETONES UA: NEGATIVE
NITRITE UA: NEGATIVE
Spec Grav, UA: 1.01 (ref 1.010–1.025)
Urobilinogen, UA: 0.2 E.U./dL
pH, UA: 6.5 (ref 5.0–8.0)

## 2017-09-11 LAB — CBC
HEMATOCRIT: 33 % — AB (ref 36.0–46.0)
Hemoglobin: 10.8 g/dL — ABNORMAL LOW (ref 12.0–15.0)
MCHC: 32.6 g/dL (ref 30.0–36.0)
MCV: 87.2 fl (ref 78.0–100.0)
PLATELETS: 522 10*3/uL — AB (ref 150.0–400.0)
RBC: 3.78 Mil/uL — AB (ref 3.87–5.11)
RDW: 14.2 % (ref 11.5–15.5)
WBC: 8.5 10*3/uL (ref 4.0–10.5)

## 2017-09-11 LAB — BASIC METABOLIC PANEL
BUN: 8 mg/dL (ref 6–23)
CO2: 28 mEq/L (ref 19–32)
Calcium: 8.1 mg/dL — ABNORMAL LOW (ref 8.4–10.5)
Chloride: 102 mEq/L (ref 96–112)
Creatinine, Ser: 1.07 mg/dL (ref 0.40–1.20)
GFR: 59.89 mL/min — AB (ref >60.00)
GLUCOSE: 90 mg/dL (ref 70–99)
Potassium: 4.1 mEq/L (ref 3.5–5.1)
Sodium: 136 mEq/L (ref 135–145)

## 2017-09-11 MED ORDER — KETOROLAC TROMETHAMINE 60 MG/2ML IM SOLN
60.0000 mg | Freq: Once | INTRAMUSCULAR | Status: AC
  Administered 2017-09-11: 60 mg via INTRAMUSCULAR

## 2017-09-11 MED ORDER — NAPROXEN 500 MG PO TABS: 500.0000 mg | ORAL_TABLET | Freq: Two times a day (BID) | ORAL | 0 refills | Status: DC

## 2017-09-11 MED ORDER — CIPROFLOXACIN HCL 500 MG PO TABS: 500.0000 mg | ORAL_TABLET | Freq: Two times a day (BID) | ORAL | 0 refills | Status: DC

## 2017-09-11 NOTE — Patient Instructions (Signed)
Start naproxen tomorrow every 12 hours with food.  Start cipro today every 12 hours for 7 days.  I have placed a referral to urology. They will call you. If worsening condition be seen in ED immediately.     Hydronephrosis Hydronephrosis is the enlargement of a kidney due to a blockage that stops urine from flowing out of the body. What are the causes? Common causes of this condition include:  A birth (congenital) defect of the kidney.  A congenital defect of the tube through which urine travels (ureter).  Kidney stones.  An enlarged prostate gland.  A tumor.  Cancer of the prostate, bladder, uterus, ovary, or colon.  A blood clot.  What are the signs or symptoms? Symptoms of this condition include:  Pain or discomfort in your side (flank).  Swelling of the abdomen.  Pain in the abdomen.  Nausea and vomiting.  Fever.  Pain while passing urine.  Feeling of urgency to urinate.  Frequent urination.  Infection of the urinary tract.  In some cases, there are no symptoms. How is this diagnosed? This condition may be diagnosed with:  A medical history.  A physical exam.  Blood and urine tests to check kidney function.  Imaging tests, such as an X-ray, ultrasound, CT scan, or MRI.  A test in which a rigid or flexible telescope (cystoscope) is used to view the site of the blockage.  How is this treated? Treatment for this condition depends on where the blockage is located, how long it has been there, and what caused it. The goal of treatment is to remove the blockage. Treatment options include:  A procedure to put in a soft tube to help drain urine.  Antibiotic medicines to treat or prevent infection.  Shock-wave therapy (lithotripsy) to help eliminate kidney stones.  Follow these instructions at home:  Get lots of rest.  Drink enough fluid to keep your urine clear or pale yellow.  If you have a drain in, follow your health care provider's  instructions about how to care for it.  Take medicines only as directed by your health care provider.  If you were prescribed an antibiotic medicine, finish all of it even if you start to feel better.  Keep all follow-up visits as directed by your health care provider. This is important. Contact a health care provider if:  You continue to have symptoms after treatment.  You develop new symptoms.  You have a problem with a drainage device.  Your urine becomes cloudy or bloody.  You have a fever. Get help right away if:  You have severe flank or abdominal pain.  You develop vomiting and are unable to keep fluids down. This information is not intended to replace advice given to you by your health care provider. Make sure you discuss any questions you have with your health care provider. Document Released: 09/14/2007 Document Revised: 04/24/2016 Document Reviewed: 11/13/2014 Elsevier Interactive Patient Education  [employer].

## 2017-09-11 NOTE — Progress Notes (Signed)
Teresa Osborne , 08-28-1976, 41 y.o., female MRN: [id number] Patient Care Team    Relationship Specialty Notifications Start End  Teresa Leatherwood, DO PCP - General Family Medicine  01/09/16     Chief Complaint  Patient presents with  . Flank Pain    left side seen in Ed 1 week ago Dx with kidney stone     Subjective: Pt presents for an OV with complaints of Left-sided flank pain. 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 She was provided with Rocephin IM and prescribed Keflex 500 mg 4 times a day. She was told to take over-the-counter NSAIDs for discomfort and follow with urology. Patient reports the discomfort has mildly improved, but is still rather significant given she is 5 days out from her ED visit. She reports intermittent sharp flashing pain of her left lower back and flank. During that time she will get hot flashes. She denies fevers or chills, nausea or vomit. She reports she had a decreased appetite but is tolerating food and liquid. She has not yet heard from urology to schedule. She is tearful today and reports she is worried about her kidneys. Labs collected in the emergency room resulted with mildly elevated white blood cell count 10.6, hemoglobin 10.8, hematocrit 32.7. Urine with large leukocytes and small hematuria. Urine culture inconclusive with multiple species present recollection recommended.   Depression screen [facility] 2/9 09/11/2017 01/09/2016  Decreased Interest 0 0  Down, Depressed, Hopeless 0 0  PHQ - 2 Score 0 0    No Known Allergies Social History  Substance Use Topics  . Smoking status: Never Smoker  . Smokeless tobacco: Never Used  . Alcohol use No   Past Medical History:  Diagnosis Date  . Anxiety   . Kidney stones    Past Surgical History:  Procedure Laterality Date  . NO PAST SURGERIES      Family History  Problem Relation Age of Onset  . Cancer Paternal Grandfather        pancreatic  . Alcohol abuse Paternal Grandfather   . Arthritis Paternal Grandfather   . COPD Paternal Grandfather   . Early death Paternal Grandfather   . Cancer Paternal Grandmother        unknown, metz  . Arthritis Paternal Grandmother   . COPD Paternal Grandmother   . Early death Paternal Grandmother   . Arthritis Father   . Mental retardation Father   . Arthritis Maternal Grandmother   . COPD Maternal Grandfather   . Heart disease Maternal Grandfather   . Cancer Sister 41       ovarian   Allergies as of 09/11/2017   No Known Allergies     Medication List       Accurate as of 09/11/17  1:48 PM. Always use your most recent med list.          cephALEXin 500 MG capsule Commonly known as:  KEFLEX Take 1 capsule (500 mg total) by mouth 4 (four) times daily.   naproxen 500 MG tablet Commonly known as:  NAPROSYN Take 1 tablet (500 mg total) by mouth 2 (two) times daily with a meal.       All past medical history, surgical history, allergies, family history, immunizations andmedications were updated in the EMR today and reviewed under the history and medication portions of their EMR.  ROS: Negative, with the exception of above mentioned in HPI   Objective:  BP 127/89 (BP Location: Left Arm, Patient Position: Sitting, Cuff Size: Large)   Pulse 100   Temp (!) 97.5 F (36.4 C)   Resp 20   Ht  (1.651 m)   Wt (!) 416 lb 8 oz (188.9 kg)   SpO2 97%   BMI 69.31 kg/m  Body mass index is 69.31 kg/m. Gen: Afebrile. No acute distress. Nontoxic in appearance, well developed, well nourished. Very pleasant morbidly obese female. Tearful. HENT: AT. [place]. MMM, no oral lesions.  Eyes:Pupils Equal Round Reactive to light, Extraocular movements intact,  Conjunctiva without redness, discharge or icterus. CV: RRR , no edema Chest: CTAB, no wheeze or crackles.   Abd: Soft. Morbidly  obese. NTND. BS present. No Masses palpated. No rebound or guarding.  MSK: Mild left CVA tenderness. Neuro:  Normal gait. PERLA. EOMi. Alert. Oriented x3    No exam data present No results found. Results for orders placed or performed in visit on 09/11/17 (from the past 24 hour(s))  POCT Urinalysis Dipstick (Automated)     Status: Abnormal   Collection Time: 09/11/17  1:25 PM  Result Value Ref Range   Color, UA Yellow    Clarity, UA Cloudy    Glucose, UA Negative    Bilirubin, UA Negative    Ketones, UA Negative    Spec Grav, UA 1.010 1.010 - 1.025   Blood, UA Moderate    pH, UA 6.5 5.0 - 8.0   Protein, UA Trace    Urobilinogen, UA 0.2 0.2 or 1.0 E.U./dL   Nitrite, UA Negative    Leukocytes, UA Large (3+) (A) Negative  IMPRESSION: 1. 3.5 x 1.2 cm staghorn calculus in the right renal pelvis with moderate hydronephrosis with mild left perinephric stranding. Superimposed infection cannot be excluded.  Assessment/Plan: Teresa Osborne is a 41 y.o. female present for OV for  Pain with urination/flank pain/hydronephrosis/staghorn kidney stone/hematuria - Patient although mildly improved, still with rather significant pain, especially over left kidney. Point-of-care urine today with with large leukocytes and moderate blood remain. Patient with new onset mild anemia in the setting of hematuria and acute illness. - Toradol IM injection provided today. Naproxen 500 mg twice a day with meals to start tomorrow. - Urine culture sent. She is finishing her Keflex today. Will cover with ciprofloxacin 500 mg twice a day for 7 days given condition and prior urine culture needed re-collected. - Recheck CBC, BMP. - POCT Urinalysis Dipstick (Automated) - ciprofloxacin (CIPRO) 500 MG tablet; Take 1 tablet (500 mg total) by mouth 2 (two) times daily.  Dispense: 14 tablet; Refill: 0 - CBC - Basic Metabolic Panel (BMET) - Urine Culture - Ambulatory referral to Urology - Follow-up one week, and was  able to see urology. If worsening over the weekend patient is to report to the emergency room.   Reviewed expectations re: course of current medical issues.  Discussed self-management of symptoms.  Outlined signs and symptoms indicating need for more acute intervention.  Patient verbalized understanding and all questions were answered.  Patient received an After-Visit Summary.    Orders Placed This Encounter  Procedures  . POCT Urinalysis Dipstick (Automated)    > 25 minutes spent with patient, >50% of time spent face to face counseling and/or coordinating care.    Note is dictated utilizing voice recognition software. Although note has been proof read prior to signing, occasional typographical errors still can be missed. If any  questions arise, please do not hesitate to call for verification.   electronically signed by:  Howard Pouch, DO  [place]

## 2017-09-11 NOTE — Telephone Encounter (Signed)
[facility] - Client TELEPHONE ADVICE RECORD [facility]  Patient Name: Teresa Osborne  DOB: Aug 25, 1976    Initial Comment Caller was seen Sunday in ER for UTI and is still having severe pain, on antibiotic.   Nurse Assessment  Nurse: Bing Plume, RN, Erin Date/Time (Eastern Time): 09/11/2017 8:14:21 AM  Confirm and document reason for call. If symptomatic, describe symptoms. ---caller states she was seen in ED on Sunday for UTI. Having pain on Left side but did find a stone on the right. On ABX, feeling aching and chilling and nausea. Denies fever  Does the patient have any new or worsening symptoms? ---Yes  Will a triage be completed? ---Yes  Related visit to physician within the last 2 weeks? ---Yes  Does the PT have any chronic conditions? (i.e. diabetes, asthma, etc.) ---No  Is the patient pregnant or possibly pregnant? (Ask all females between the ages of 20-55) ---No  Is this a behavioral health or substance abuse call? ---No     Guidelines    Guideline Title Affirmed Question Affirmed Notes  Urinary Tract Infection on Antibiotic Follow-up Call - Female [1] Taking antibiotic > 24 hours for UTI AND [2] flank or lower back pain worsening    Final Disposition User   See Physician within 4 Hours (or PCP triage) Bing Plume, RN, Erin    Comments  Only Medication Cephlaxin  QID Motrin Tylenol  Appt with Felix Pacini at 130 pm today   Referrals  REFERRED TO PCP OFFICE   Caller Disagree/Comply Comply  Caller Understands Yes  PreDisposition Did not know what to do

## 2017-09-13 LAB — URINE CULTURE
MICRO NUMBER: 81143075
SPECIMEN QUALITY:: ADEQUATE

## 2017-09-14 ENCOUNTER — Ambulatory Visit (INDEPENDENT_AMBULATORY_CARE_PROVIDER_SITE_OTHER): Payer: 59 | Admitting: Family Medicine

## 2017-09-14 ENCOUNTER — Encounter: Payer: Self-pay | Admitting: Family Medicine

## 2017-09-14 VITALS — BP 107/72 | HR 90 | Temp 98.2°F | Resp 20 | Ht 65.0 in | Wt >= 6400 oz

## 2017-09-14 DIAGNOSIS — Z1231 Encounter for screening mammogram for malignant neoplasm of breast: Secondary | ICD-10-CM

## 2017-09-14 DIAGNOSIS — E559 Vitamin D deficiency, unspecified: Secondary | ICD-10-CM | POA: Diagnosis not present

## 2017-09-14 DIAGNOSIS — Z0000 Encounter for general adult medical examination without abnormal findings: Secondary | ICD-10-CM | POA: Diagnosis not present

## 2017-09-14 DIAGNOSIS — Z23 Encounter for immunization: Secondary | ICD-10-CM | POA: Diagnosis not present

## 2017-09-14 DIAGNOSIS — Z1239 Encounter for other screening for malignant neoplasm of breast: Secondary | ICD-10-CM

## 2017-09-14 DIAGNOSIS — Z6841 Body Mass Index (BMI) 40.0 and over, adult: Secondary | ICD-10-CM | POA: Diagnosis not present

## 2017-09-14 LAB — LIPID PANEL
CHOL/HDL RATIO: 7
Cholesterol: 172 mg/dL (ref 0–200)
HDL: 25.8 mg/dL — AB (ref >39.00)
LDL Cholesterol: 116 mg/dL — ABNORMAL HIGH (ref 0–99)
NonHDL: 146.68
TRIGLYCERIDES: 152 mg/dL — AB (ref 0.0–149.0)
VLDL: 30.4 mg/dL (ref 0.0–40.0)

## 2017-09-14 LAB — TSH: TSH: 4.74 u[IU]/mL — ABNORMAL HIGH (ref 0.35–4.50)

## 2017-09-14 LAB — VITAMIN D 25 HYDROXY (VIT D DEFICIENCY, FRACTURES): VITD: 9.48 ng/mL — ABNORMAL LOW (ref 30.00–100.00)

## 2017-09-14 LAB — HEMOGLOBIN A1C: Hgb A1c MFr Bld: 6 % (ref 4.6–6.5)

## 2017-09-14 NOTE — Progress Notes (Signed)
Patient ID: Teresa Osborne, female   DOB: 1975/12/12, 41 y.o.   MRN: [id number]      Patient ID: Teresa Osborne, female  DOB: October 23, 1976, 41 y.o.   MRN: [id number]  Subjective:  Teresa Osborne is a 41 y.o. female present for CPE. All past medical history, surgical history, allergies, family history, immunizations and social history was obtained from the patient today and entered into the electronic medical record.    Health maintenance: Updated 09/14/2017 Colonoscopy: No known family history of colon cancer. Routine screening at age 47. Mammogram: No known history of breast cancer in the family. Screening ordered at breast center.  Cervical cancer screening: [facility], [place]. Freddy Finner, NP. Last Pap 2014, per patient report. Reports all normal Paps. Continue follow-up with gynecology. Immunizations: tdap updated today, declines flu shot today Infectious disease screening: HIV 2008 DEXA: Consider early screening secondary to vitamin D deficiency. Assistive device: None Oxygen use: None Patient has a Dental home. Hospitalizations/ED visits: Reviewed  Past Medical History:  Diagnosis Date  . Anxiety   . Kidney stones    No Known Allergies Past Surgical History:  Procedure Laterality Date  . NO PAST SURGERIES     Family History  Problem Relation Age of Onset  . Cancer Paternal Grandfather        pancreatic  . Alcohol abuse Paternal Grandfather   . Arthritis Paternal Grandfather   . COPD Paternal Grandfather   . Early death Paternal Grandfather   . Cancer Paternal Grandmother        unknown, metz  . Arthritis Paternal Grandmother   . COPD Paternal Grandmother   . Early death Paternal Grandmother   . Arthritis Father   . Mental retardation Father   . Arthritis Maternal Grandmother   . COPD Maternal Grandfather   . Heart disease Maternal Grandfather   . Cancer Sister 2       ovarian   Social History   Social History  . Marital status: Married   Spouse name: N/A  . Number of children: N/A  . Years of education: N/A   Occupational History  . Not on file.   Social History Main Topics  . Smoking status: Never Smoker  . Smokeless tobacco: Never Used  . Alcohol use No  . Drug use: No  . Sexual activity: Yes    Birth control/ protection: IUD     Comment: mirena 03/2013   Other Topics Concern  . Not on file   Social History Narrative   Married to [place]. 2 children [name] in [place].   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   Drink caffeinated beverages, wears her seatbelt, wears a bike helmet.   Smoke detector in the home   Feels safe in her relationships      ROS: Negative, with the exception of above mentioned in HPI  Objective: BP 107/72 (BP Location: Left Arm, Patient Position: Sitting, Cuff Size: Large)   Pulse 90   Temp 98.2 F (36.8 C)   Resp 20   Ht  (1.651 m)   Wt (!) 408 lb 8 oz (185.3 kg)   SpO2 97%   BMI 67.98 kg/m   Gen: Afebrile. No acute distress. Nontoxic in appearance, well-developed, well-nourished, morbidly obese female. HENT: AT. [place]. Bilateral TM visualized and normal in appearance. MMM. Bilateral nares without erythema or swelling. Throat without erythema or exudates. No cough, no hoarseness. Eyes:Pupils Equal Round Reactive to light, Extraocular movements intact,  Conjunctiva without redness, discharge or icterus.  Neck/lymp/endocrine: Supple, no lymphadenopathy, no thyromegaly CV: RRR no murmur, no edema, +2/4 P posterior tibialis pulses Chest: CTAB, no wheeze or crackles Abd: Soft. Obese. NTND. BS present. No Masses palpated. No hepatosplenomegaly Skin: No rashes, purpura or petechiae.  Neuro/MSK:  Normal gait. PERLA. EOMi. Alert. Oriented. Cranial nerves II through XII intact. Muscle strength 5/5 upper and lower extremity. DTRs equal bilaterally. Psych: Normal affect, dress and demeanor. Normal speech. Normal thought content and judgment.  Assessment/plan: Teresa Osborne is a 41 y.o. female  present for establishment of care with annual exam.  Encounter for preventive health examination Patient was encouraged to exercise greater than 150 minutes a week. Patient was encouraged to choose a diet filled with fresh fruits and vegetables, and lean meats. AVS provided to patient today for education/recommendation on gender specific health and safety maintenance. Colonoscopy: No known family history of colon cancer. Routine screening at age 16. Mammogram: No known history of breast cancer in the family. Screening ordered at breast center.  Cervical cancer screening: [facility], [place]. Jennerfer griffin, NP. UTD Immunizations: tdap updated today, declines flu shot today Infectious disease screening: HIV 2008 DEXA: Consider screening earlier secondary to severe vitamin D deficiency.  BMI 60.0-69.9, adult (HCC) Diet and exercise was encouraged. Mediterranean diet and paleo diet examples were provided to patient today. Briefly discussed medication use such as Contrave for assistance. Patient is getting over a acute illness secondary to kidney stone. Her to be recovered before starting a new medication on her. She is in agreement with this. - Continue to walk daily, attempts at reaching 150 minutes a week of cardio exercise. - Lipid panel - HgB A1c - TSH Vitamin D deficiency - Currently not supplementing with any vitamin D, she has been extremely low in the past. Daily supplementation of 1000 units was encouraged. - Vitamin D (25 hydroxy) Breast cancer screening - MM DIGITAL SCREENING BILATERAL; Future Need for tetanus, diphtheria, and acellular pertussis (Tdap) vaccine - Tdap vaccine greater than or equal to 7yo IM   No Follow-up on file.   Felix Pacini, DO [facility]

## 2017-09-14 NOTE — Patient Instructions (Signed)
Health Maintenance, Female Adopting a healthy lifestyle and getting preventive care can go a long way to promote health and wellness. Talk with your health care provider about what schedule of regular examinations is right for you. This is a good chance for you to check in with your provider about disease prevention and staying healthy. In between checkups, there are plenty of things you can do on your own. Experts have done a lot of research about which lifestyle changes and preventive measures are most likely to keep you healthy. Ask your health care provider for more information. Weight and diet Eat a healthy diet  Be sure to include plenty of vegetables, fruits, low-fat dairy products, and lean protein.  Do not eat a lot of foods high in solid fats, added sugars, or salt.  Get regular exercise. This is one of the most important things you can do for your health. ? Most adults should exercise for at least 150 minutes each week. The exercise should increase your heart rate and make you sweat (moderate-intensity exercise). ? Most adults should also do strengthening exercises at least twice a week. This is in addition to the moderate-intensity exercise.  Maintain a healthy weight  Body mass index (BMI) is a measurement that can be used to identify possible weight problems. It estimates body fat based on height and weight. Your health care provider can help determine your BMI and help you achieve or maintain a healthy weight.  For females 20 years of age and older: ? A BMI below 18.5 is considered underweight. ? A BMI of 18.5 to 24.9 is normal. ? A BMI of 25 to 29.9 is considered overweight. ? A BMI of 30 and above is considered obese.  Watch levels of cholesterol and blood lipids  You should start having your blood tested for lipids and cholesterol at 41 years of age, then have this test every 5 years.  You may need to have your cholesterol levels checked more often if: ? Your lipid or  cholesterol levels are high. ? You are older than 41 years of age. ? You are at high risk for heart disease.  Cancer screening Lung Cancer  Lung cancer screening is recommended for adults 55-80 years old who are at high risk for lung cancer because of a history of smoking.  A yearly low-dose CT scan of the lungs is recommended for people who: ? Currently smoke. ? Have quit within the past 15 years. ? Have at least a 30-pack-year history of smoking. A pack year is smoking an average of one pack of cigarettes a day for 1 year.  Yearly screening should continue until it has been 15 years since you quit.  Yearly screening should stop if you develop a health problem that would prevent you from having lung cancer treatment.  Breast Cancer  Practice breast self-awareness. This means understanding how your breasts normally appear and feel.  It also means doing regular breast self-exams. Let your health care provider know about any changes, no matter how small.  If you are in your 20s or 30s, you should have a clinical breast exam (CBE) by a health care provider every 1-3 years as part of a regular health exam.  If you are 40 or older, have a CBE every year. Also consider having a breast X-ray (mammogram) every year.  If you have a family history of breast cancer, talk to your health care provider about genetic screening.  If you are at high risk   for breast cancer, talk to your health care provider about having an MRI and a mammogram every year.  Breast cancer gene (BRCA) assessment is recommended for women who have family members with BRCA-related cancers. BRCA-related cancers include: ? Breast. ? Ovarian. ? Tubal. ? Peritoneal cancers.  Results of the assessment will determine the need for genetic counseling and BRCA1 and BRCA2 testing.  Cervical Cancer Your health care provider may recommend that you be screened regularly for cancer of the pelvic organs (ovaries, uterus, and  vagina). This screening involves a pelvic examination, including checking for microscopic changes to the surface of your cervix (Pap test). You may be encouraged to have this screening done every 3 years, beginning at age 22.  For women ages 56-65, health care providers may recommend pelvic exams and Pap testing every 3 years, or they may recommend the Pap and pelvic exam, combined with testing for human papilloma virus (HPV), every 5 years. Some types of HPV increase your risk of cervical cancer. Testing for HPV may also be done on women of any age with unclear Pap test results.  Other health care providers may not recommend any screening for nonpregnant women who are considered low risk for pelvic cancer and who do not have symptoms. Ask your health care provider if a screening pelvic exam is right for you.  If you have had past treatment for cervical cancer or a condition that could lead to cancer, you need Pap tests and screening for cancer for at least 20 years after your treatment. If Pap tests have been discontinued, your risk factors (such as having a new sexual partner) need to be reassessed to determine if screening should resume. Some women have medical problems that increase the chance of getting cervical cancer. In these cases, your health care provider may recommend more frequent screening and Pap tests.  Colorectal Cancer  This type of cancer can be detected and often prevented.  Routine colorectal cancer screening usually begins at 41 years of age and continues through 41 years of age.  Your health care provider may recommend screening at an earlier age if you have risk factors for colon cancer.  Your health care provider may also recommend using home test kits to check for hidden blood in the stool.  A small camera at the end of a tube can be used to examine your colon directly (sigmoidoscopy or colonoscopy). This is done to check for the earliest forms of colorectal  cancer.  Routine screening usually begins at age 33.  Direct examination of the colon should be repeated every 5-10 years through 41 years of age. However, you may need to be screened more often if early forms of precancerous polyps or small growths are found.  Skin Cancer  Check your skin from head to toe regularly.  Tell your health care provider about any new moles or changes in moles, especially if there is a change in a mole's shape or color.  Also tell your health care provider if you have a mole that is larger than the size of a pencil eraser.  Always use sunscreen. Apply sunscreen liberally and repeatedly throughout the day.  Protect yourself by wearing long sleeves, pants, a wide-brimmed hat, and sunglasses whenever you are outside.  Heart disease, diabetes, and high blood pressure  High blood pressure causes heart disease and increases the risk of stroke. High blood pressure is more likely to develop in: ? People who have blood pressure in the high end of  the normal range (130-139/85-89 mm Hg). ? People who are overweight or obese. ? People who are African American.  If you are 21-29 years of age, have your blood pressure checked every 3-5 years. If you are 3 years of age or older, have your blood pressure checked every year. You should have your blood pressure measured twice-once when you are at a hospital or clinic, and once when you are not at a hospital or clinic. Record the average of the two measurements. To check your blood pressure when you are not at a hospital or clinic, you can use: ? An automated blood pressure machine at a pharmacy. ? A home blood pressure monitor.  If you are between 17 years and 37 years old, ask your health care provider if you should take aspirin to prevent strokes.  Have regular diabetes screenings. This involves taking a blood sample to check your fasting blood sugar level. ? If you are at a normal weight and have a low risk for diabetes,  have this test once every three years after 41 years of age. ? If you are overweight and have a high risk for diabetes, consider being tested at a younger age or more often. Preventing infection Hepatitis B  If you have a higher risk for hepatitis B, you should be screened for this virus. You are considered at high risk for hepatitis B if: ? You were born in a country where hepatitis B is common. Ask your health care provider which countries are considered high risk. ? Your parents were born in a high-risk country, and you have not been immunized against hepatitis B (hepatitis B vaccine). ? You have HIV or AIDS. ? You use needles to inject street drugs. ? You live with someone who has hepatitis B. ? You have had sex with someone who has hepatitis B. ? You get hemodialysis treatment. ? You take certain medicines for conditions, including cancer, organ transplantation, and autoimmune conditions.  Hepatitis C  Blood testing is recommended for: ? Everyone born from [birth year] through 1965. ? Anyone with known risk factors for hepatitis C.  Sexually transmitted infections (STIs)  You should be screened for sexually transmitted infections (STIs) including gonorrhea and chlamydia if: ? You are sexually active and are younger than 41 years of age. ? You are older than 41 years of age and your health care provider tells you that you are at risk for this type of infection. ? Your sexual activity has changed since you were last screened and you are at an increased risk for chlamydia or gonorrhea. Ask your health care provider if you are at risk.  If you do not have HIV, but are at risk, it may be recommended that you take a prescription medicine daily to prevent HIV infection. This is called pre-exposure prophylaxis (PrEP). You are considered at risk if: ? You are sexually active and do not regularly use condoms or know the HIV status of your partner(s). ? You take drugs by injection. ? You are  sexually active with a partner who has HIV.  Talk with your health care provider about whether you are at high risk of being infected with HIV. If you choose to begin PrEP, you should first be tested for HIV. You should then be tested every 3 months for as long as you are taking PrEP. Pregnancy  If you are premenopausal and you may become pregnant, ask your health care provider about preconception counseling.  If you may become  pregnant, take 400 to 800 micrograms (mcg) of folic acid every day.  If you want to prevent pregnancy, talk to your health care provider about birth control (contraception). Osteoporosis and menopause  Osteoporosis is a disease in which the bones lose minerals and strength with aging. This can result in serious bone fractures. Your risk for osteoporosis can be identified using a bone density scan.  If you are 65 years of age or older, or if you are at risk for osteoporosis and fractures, ask your health care provider if you should be screened.  Ask your health care provider whether you should take a calcium or vitamin D supplement to lower your risk for osteoporosis.  Menopause may have certain physical symptoms and risks.  Hormone replacement therapy may reduce some of these symptoms and risks. Talk to your health care provider about whether hormone replacement therapy is right for you. Follow these instructions at home:  Schedule regular health, dental, and eye exams.  Stay current with your immunizations.  Do not use any tobacco products including cigarettes, chewing tobacco, or electronic cigarettes.  If you are pregnant, do not drink alcohol.  If you are breastfeeding, limit how much and how often you drink alcohol.  Limit alcohol intake to no more than 1 drink per day for nonpregnant women. One drink equals 12 ounces of beer, 5 ounces of wine, or 1 ounces of hard liquor.  Do not use street drugs.  Do not share needles.  Ask your health care  provider for help if you need support or information about quitting drugs.  Tell your health care provider if you often feel depressed.  Tell your health care provider if you have ever been abused or do not feel safe at home. This information is not intended to replace advice given to you by your health care provider. Make sure you discuss any questions you have with your health care provider. Document Released: 06/02/2011 Document Revised: 04/24/2016 Document Reviewed: 08/21/2015 Elsevier Interactive Patient Education  2018 [employer] Fat and Cholesterol Restricted Diet Getting too much fat and cholesterol in your diet may cause health problems. Following this diet helps keep your fat and cholesterol at normal levels. This can keep you from getting sick. What types of fat should I choose?  Choose monosaturated and polyunsaturated fats. These are found in foods such as olive oil, canola oil, flaxseeds, walnuts, almonds, and seeds.  Eat more omega-3 fats. Good choices include salmon, mackerel, sardines, tuna, flaxseed oil, and ground flaxseeds.  Limit saturated fats. These are in animal products such as meats, butter, and cream. They can also be in plant products such as palm oil, palm kernel oil, and coconut oil.  Avoid foods with partially hydrogenated oils in them. These contain trans fats. Examples of foods that have trans fats are stick margarine, some tub margarines, cookies, crackers, and other baked goods. What general guidelines do I need to follow?  Check food labels. Look for the words "trans fat" and "saturated fat."  When preparing a meal: ? Fill half of your plate with vegetables and green salads. ? Fill one fourth of your plate with whole grains. Look for the word "whole" as the first word in the ingredient list. ? Fill one fourth of your plate with lean protein foods.  Eat more foods that have fiber, like apples, carrots, beans, peas, and barley.  Eat more home-cooked  foods. Eat less at restaurants and buffets.  Limit or avoid alcohol.  Limit foods high   in starch and sugar.  Limit fried foods.  Cook foods without frying them. Baking, boiling, grilling, and broiling are all great options.  Lose weight if you are overweight. Losing even a small amount of weight can help your overall health. It can also help prevent diseases such as diabetes and heart disease. What foods can I eat? Grains Whole grains, such as whole wheat or whole grain breads, crackers, cereals, and pasta. Unsweetened oatmeal, bulgur, barley, quinoa, or brown rice. Corn or whole wheat flour tortillas. Vegetables Fresh or frozen vegetables (raw, steamed, roasted, or grilled). Green salads. Fruits All fresh, canned (in natural juice), or frozen fruits. Meat and Other Protein Products Ground beef (85% or leaner), grass-fed beef, or beef trimmed of fat. Skinless chicken or turkey. Ground chicken or turkey. Pork trimmed of fat. All fish and seafood. Eggs. Dried beans, peas, or lentils. Unsalted nuts or seeds. Unsalted canned or dry beans. Dairy Low-fat dairy products, such as skim or 1% milk, 2% or reduced-fat cheeses, low-fat ricotta or cottage cheese, or plain low-fat yogurt. Fats and Oils Tub margarines without trans fats. Light or reduced-fat mayonnaise and salad dressings. Avocado. Olive, canola, sesame, or safflower oils. Natural peanut or almond butter (choose ones without added sugar and oil). The items listed above may not be a complete list of recommended foods or beverages. Contact your dietitian for more options. What foods are not recommended? Grains White bread. White pasta. White rice. Cornbread. Bagels, pastries, and croissants. Crackers that contain trans fat. Vegetables White potatoes. Corn. Creamed or fried vegetables. Vegetables in a cheese sauce. Fruits Dried fruits. Canned fruit in light or heavy syrup. Fruit juice. Meat and Other Protein Products Fatty cuts of  meat. Ribs, chicken wings, bacon, sausage, bologna, salami, chitterlings, fatback, hot dogs, bratwurst, and packaged luncheon meats. Liver and organ meats. Dairy Whole or 2% milk, cream, half-and-half, and cream cheese. Whole milk cheeses. Whole-fat or sweetened yogurt. Full-fat cheeses. Nondairy creamers and whipped toppings. Processed cheese, cheese spreads, or cheese curds. Sweets and Desserts Corn syrup, sugars, honey, and molasses. Candy. Jam and jelly. Syrup. Sweetened cereals. Cookies, pies, cakes, donuts, muffins, and ice cream. Fats and Oils Butter, stick margarine, lard, shortening, ghee, or bacon fat. Coconut, palm kernel, or palm oils. Beverages Alcohol. Sweetened drinks (such as sodas, lemonade, and fruit drinks or punches). The items listed above may not be a complete list of foods and beverages to avoid. Contact your dietitian for more information. This information is not intended to replace advice given to you by your health care provider. Make sure you discuss any questions you have with your health care provider. Document Released: 05/18/2012 Document Revised: 07/24/2016 Document Reviewed: 02/16/2014 Elsevier Interactive Patient Education  2018 [employer]  

## 2017-09-15 ENCOUNTER — Telehealth: Payer: Self-pay | Admitting: Family Medicine

## 2017-09-15 NOTE — Telephone Encounter (Signed)
Patient called and left message for [place] to call her back and leave results on her voice mail. Left detailed message with results and instructions on patient voice mail as requested.

## 2017-09-15 NOTE — Telephone Encounter (Signed)
Please call patient: - I'm still waiting on her urine culture sensitivities. We call the lab and check on these please. Typically the report states that sensitivities are pending, this particular result does not state this and went to ensure they are doing them.  - Her cholesterol levels are good with the exception of her good cholesterol the HDL, could be higher. Is currently 25 and we like to see this around 40. She could bring this up with eating foods such as olive oil, salmon, whole grains, beans/legumes and exercise. - Her vitamin D is very low again at 9. This should be at least above 30, and more ideally around 40. I have called in a once a week supplementation for her to take for 12 weeks, and then a provider appointment for follow-up. She should also start a 1000 units daily vitamin D supplementation to take not only during these 12 weeks, but to continue after. - Her hemoglobin A1c, diabetes screen was 6.0 this places her in the prediabetic range. Diet and exercise modifications we discussed during her visit will help lower this to the normal range (<5.8). This should be followed up every 6 months.  We could also consider metformin use for prediabetes/metabolic syndrome which also may help her with her weight loss goals. We had discussed starting a medication for her weight loss as well, but wanted to wait till she got over her kidney stone/acute illness. She can schedule an appointment at her earliest convenience to discuss. - Her TSH which is thyroid function is very mildly abnormal. Typically we see fluctuations and thyroid with acute illnesses such as she just experienced, however we would like to follow-up with this with repeat in 12 weeks and we see her for her vitamin D levels.

## 2017-09-15 NOTE — Telephone Encounter (Signed)
Left message for patient to return call.

## 2017-09-22 ENCOUNTER — Telehealth: Payer: Self-pay | Admitting: Family Medicine

## 2017-09-22 NOTE — Telephone Encounter (Signed)
Patient risk factors for surgery include prediabetes with an elevated a1c of 6.0, mild anemia (10.8 hgb) secondary to acute illness being treated by requested surgery and Morbid obesity (BMI 68).  No contraindications to surgery via recent CPE and labs collected at that visit (results below). Incentive spirometry should be encouraged after anesthesia secondary to morbid obesity.  Patient has no cardiac or pulmonary conditions reported. No medication concerns.   Recent Results (from the past 2160 hour(s))  Urinalysis, Routine w reflex microscopic- may I&O cath if menses     Status: Abnormal   Collection Time: 09/06/17  1:51 PM  Result Value Ref Range   Color, Urine YELLOW YELLOW   APPearance CLOUDY (A) CLEAR   Specific Gravity, Urine 1.011 1.005 - 1.030   pH 6.0 5.0 - 8.0   Glucose, UA NEGATIVE NEGATIVE mg/dL   Hgb urine dipstick SMALL (A) NEGATIVE   Bilirubin Urine NEGATIVE NEGATIVE   Ketones, ur NEGATIVE NEGATIVE mg/dL   Protein, ur NEGATIVE NEGATIVE mg/dL   Nitrite NEGATIVE NEGATIVE   Leukocytes, UA LARGE (A) NEGATIVE   RBC / HPF 6-30 0 - 5 RBC/hpf   WBC, UA TOO NUMEROUS TO COUNT 0 - 5 WBC/hpf   Bacteria, UA RARE (A) NONE SEEN   Squamous Epithelial / LPF 6-30 (A) NONE SEEN   WBC Clumps PRESENT   Urine culture     Status: Abnormal   Collection Time: 09/06/17  1:51 PM  Result Value Ref Range   Specimen Description URINE, CLEAN CATCH    Special Requests NONE    Culture MULTIPLE SPECIES PRESENT, SUGGEST RECOLLECTION (A)    Report Status 09/08/2017 FINAL   POC urine preg, ED     Status: None   Collection Time: 09/06/17  2:04 PM  Result Value Ref Range   Preg Test, Ur NEGATIVE NEGATIVE    Comment:        THE SENSITIVITY OF THIS METHODOLOGY IS >24 mIU/mL   I-stat Chem 8, ED     Status: Abnormal   Collection Time: 09/06/17  4:07 PM  Result Value Ref Range   Sodium 136 135 - 145 mmol/L   Potassium 4.1 3.5 - 5.1 mmol/L   Chloride 101 101 - 111 mmol/L   BUN 10 6 - 20 mg/dL   Creatinine, Ser 1.611.10 (H) 0.44 - 1.00 mg/dL   Glucose, Bld 87 65 - 99 mg/dL   Calcium, Ion 0.961.13 (L) 1.15 - 1.40 mmol/L   TCO2 28 22 - 32 mmol/L   Hemoglobin 10.5 (L) 12.0 - 15.0 g/dL   HCT 04.531.0 (L) 40.936.0 - 81.146.0 %  CBC with Differential/Platelet     Status: Abnormal   Collection Time: 09/06/17  4:41 PM  Result Value Ref Range   WBC 10.6 (H) 4.0 - 10.5 K/uL   RBC 3.69 (L) 3.87 - 5.11 MIL/uL   Hemoglobin 10.8 (L) 12.0 - 15.0 g/dL   HCT 91.432.7 (L) 78.236.0 - 95.646.0 %   MCV 88.6 78.0 - 100.0 fL   MCH 29.3 26.0 - 34.0 pg   MCHC 33.0 30.0 - 36.0 g/dL   RDW 21.313.7 08.611.5 - 57.815.5 %   Platelets 378 150 - 400 K/uL   Neutrophils Relative % 71 %   Neutro Abs 7.5 1.7 - 7.7 K/uL   Lymphocytes Relative 17 %   Lymphs Abs 1.8 0.7 - 4.0 K/uL   Monocytes Relative 8 %   Monocytes Absolute 0.8 0.1 - 1.0 K/uL   Eosinophils Relative 4 %   Eosinophils Absolute  0.4 0.0 - 0.7 K/uL   Basophils Relative 0 %   Basophils Absolute 0.0 0.0 - 0.1 K/uL  POCT Urinalysis Dipstick (Automated)     Status: Abnormal   Collection Time: 09/11/17  1:25 PM  Result Value Ref Range   Color, UA Yellow    Clarity, UA Cloudy    Glucose, UA Negative    Bilirubin, UA Negative    Ketones, UA Negative    Spec Grav, UA 1.010 1.010 - 1.025   Blood, UA Moderate    pH, UA 6.5 5.0 - 8.0   Protein, UA Trace    Urobilinogen, UA 0.2 0.2 or 1.0 E.U./dL   Nitrite, UA Negative    Leukocytes, UA Large (3+) (A) Negative  CBC     Status: Abnormal   Collection Time: 09/11/17  2:12 PM  Result Value Ref Range   WBC 8.5 4.0 - 10.5 K/uL   RBC 3.78 (L) 3.87 - 5.11 Mil/uL   Platelets 522.0 (H) 150.0 - 400.0 K/uL   Hemoglobin 10.8 (L) 12.0 - 15.0 g/dL   HCT 16.1 (L) 09.6 - 04.5 %   MCV 87.2 78.0 - 100.0 fl   MCHC 32.6 30.0 - 36.0 g/dL   RDW 40.9 81.1 - 91.4 %  Basic Metabolic Panel (BMET)     Status: Abnormal   Collection Time: 09/11/17  2:12 PM  Result Value Ref Range   Sodium 136 135 - 145 mEq/L   Potassium 4.1 3.5 - 5.1 mEq/L   Chloride 102 96  - 112 mEq/L   CO2 28 19 - 32 mEq/L   Glucose, Bld 90 70 - 99 mg/dL   BUN 8 6 - 23 mg/dL   Creatinine, Ser 7.82 0.40 - 1.20 mg/dL   Calcium 8.1 (L) 8.4 - 10.5 mg/dL   GFR 95.62 (L) >13.08 mL/min  Urine Culture     Status: Abnormal   Collection Time: 09/11/17  2:12 PM  Result Value Ref Range   MICRO NUMBER: [id number]    SPECIMEN QUALITY: ADEQUATE    Sample Source BLOOD    STATUS: FINAL    ISOLATE 1: Corynebacterium species (A)   Lipid panel     Status: Abnormal   Collection Time: 09/14/17  9:41 AM  Result Value Ref Range   Cholesterol 172 0 - 200 mg/dL    Comment: ATP III Classification       Desirable:  < 200 mg/dL               Borderline High:  200 - 239 mg/dL          High:  > = 295 mg/dL   Triglycerides 284.1 (H) 0.0 - 149.0 mg/dL    Comment: Normal:  <324 mg/dLBorderline High:  150 - 199 mg/dL   HDL 40.10 (L) >27.25 mg/dL   VLDL 36.6 0.0 - 44.0 mg/dL   LDL Cholesterol 347 (H) 0 - 99 mg/dL   Total CHOL/HDL Ratio 7     Comment:                Men          Women1/2 Average Risk     3.4          3.3Average Risk          5.0          4.42X Average Risk          9.6          7.13X Average Risk  15.0          11.0                       NonHDL 146.68     Comment: NOTE:  Non-HDL goal should be 30 mg/dL higher than patient's LDL goal (i.e. LDL goal of < 70 mg/dL, would have non-HDL goal of < 100 mg/dL)  HgB Z6X     Status: None   Collection Time: 09/14/17  9:41 AM  Result Value Ref Range   Hgb A1c MFr Bld 6.0 4.6 - 6.5 %    Comment: Glycemic Control Guidelines for People with Diabetes:Non Diabetic:  <6%Goal of Therapy: <7%Additional Action Suggested:  >8%   TSH     Status: Abnormal   Collection Time: 09/14/17  9:41 AM  Result Value Ref Range   TSH 4.74 (H) 0.35 - 4.50 uIU/mL  Vitamin D (25 hydroxy)     Status: Abnormal   Collection Time: 09/14/17  9:41 AM  Result Value Ref Range   VITD 9.48 (L) 30.00 - 100.00 ng/mL   Electronically Signed by: Felix Pacini, DO [facility]- OR

## 2017-09-22 NOTE — Telephone Encounter (Signed)
[facility] would like to schedule the patient for surgery (date is pending) for left PCNL, surgeon access. Per Dr Ronne BinningMcKenzie the patient can stay on her aspirin. Based on her recent CPE and OV do you agree patient is cleared for surgery?

## 2017-09-23 NOTE — Telephone Encounter (Signed)
Note has been faxed to [facility]

## 2017-09-24 ENCOUNTER — Other Ambulatory Visit: Payer: Self-pay | Admitting: Urology

## 2017-10-05 ENCOUNTER — Other Ambulatory Visit: Payer: Self-pay | Admitting: Urology

## 2017-10-05 DIAGNOSIS — N200 Calculus of kidney: Secondary | ICD-10-CM

## 2017-10-08 ENCOUNTER — Encounter: Payer: Self-pay | Admitting: Family Medicine

## 2017-10-08 ENCOUNTER — Ambulatory Visit: Payer: 59 | Admitting: Family Medicine

## 2017-10-08 VITALS — BP 135/84 | HR 73 | Temp 98.4°F | Resp 18 | Ht 65.0 in | Wt >= 6400 oz

## 2017-10-08 DIAGNOSIS — Z713 Dietary counseling and surveillance: Secondary | ICD-10-CM

## 2017-10-08 DIAGNOSIS — Z6841 Body Mass Index (BMI) 40.0 and over, adult: Secondary | ICD-10-CM | POA: Diagnosis not present

## 2017-10-08 MED ORDER — NALTREXONE-BUPROPION HCL ER 8-90 MG PO TB12: ORAL_TABLET | ORAL | 0 refills | Status: DC

## 2017-10-08 NOTE — Progress Notes (Signed)
Teresa Osborne , [birth year], 41 y.o., female MRN: [id number] Patient Care Team    Relationship Specialty Notifications Start End  Natalia LeatherwoodKuneff, Renee A, DO PCP - General Family Medicine  01/09/16     Chief Complaint  Patient presents with  . Follow-up    weight management     Subjective: Patient presents today to start medical guided weight management for morbid obesity.   Patient reports she has attempted to start losing weight over the summer. Starting weight: 05/03/2017 425 pounds, BMI 70 Start of medical managed weight loss: 10/08/2012 406.5 pounds  BMI 67.65 Height: 5 feet 5 inches  Patient reports she is not following any certain diet. She is not tracking calories. She is not tracking her exercise. She has started to walk a few times a week. She has started to take steps at work for the elevator. She has decreased foods she notices unhealthy such as fast foods and fried foods. She has never taken medication for weight loss or then a formal exercise or weight loss program. She feels food preparation is going to be an obstacle for her.   [facility] 09/14/2017 09/11/2017 01/09/2016  Decreased Interest 0 0 0  Down, Depressed, Hopeless 0 0 0  PHQ - 2 Score 0 0 0    No Known Allergies Social History   Tobacco Use  . Smoking status: Never Smoker  . Smokeless tobacco: Never Used  Substance Use Topics  . Alcohol use: No   Past Medical History:  Diagnosis Date  . Anxiety   . Kidney stones    Past Surgical History:  Procedure Laterality Date  . NO PAST SURGERIES     Family History  Problem Relation Age of Onset  . Cancer Paternal Grandfather        pancreatic  . Alcohol abuse Paternal Grandfather   . Arthritis Paternal Grandfather   . COPD Paternal Grandfather   . Early death Paternal Grandfather   . Cancer Paternal Grandmother        unknown, metz  . Arthritis Paternal Grandmother   . COPD Paternal Grandmother   . Early death Paternal Grandmother   .  Arthritis Father   . Mental retardation Father   . Arthritis Maternal Grandmother   . COPD Maternal Grandfather   . Heart disease Maternal Grandfather   . Cancer Sister [id number]       ovarian   Allergies as of 10/08/2017   No Known Allergies     Medication List        Accurate as of 10/08/17  8:13 AM. Always use your most recent med list.          naproxen 500 MG tablet Commonly known as:  NAPROSYN Take 1 tablet (500 mg total) by mouth 2 (two) times daily with a meal.       All past medical history, surgical history, allergies, family history, immunizations andmedications were updated in the EMR today and reviewed under the history and medication portions of their EMR.     ROS: Negative, with the exception of above mentioned in HPI   Objective:  BP 135/84 (BP Location: Right Arm, Patient Position: Sitting, Cuff Size: Large)   Pulse 73   Temp 98.4 F (36.9 C)   Resp 18   Ht 5\' 5"  (1.651 m)   Wt (!) 406 lb 8 oz (184.4 kg)   SpO2 98%   BMI 67.65 kg/m  Body mass index is 67.65 kg/m. Gen: Afebrile. No acute  distress. Nontoxic in appearance, well developed, well nourished.  Morbidly obse, Caucasian female. HENT: AT. [place].  MMM, no oral lesions.  Eyes:Pupils Equal Round Reactive to light, Extraocular movements intact,  Conjunctiva without redness, discharge or icterus. Neck/lymp/endocrine: Supple, no thyromegaly CV: RRR no murmur, no edema Chest: CTAB, no wheeze or crackles. .  Abd: Soft. Morbidly obese. NTND.  MSK: No obvious deformities, full range of motion, neurovascular intact Neuro:  Normal gait. PERLA. EOMi. Alert. Oriented x3  Psych: Normal affect, dress and demeanor. Normal speech. Normal thought content and judgment.  No exam data present No results found. No results found for this or any previous visit (from the past 24 hour(s)).  Assessment/Plan: Teresa Osborne is a 41 y.o. female present for OV for  Weight loss counseling, encounter for Class 3 severe  obesity due to excess calories without serious comorbidity with body mass index (BMI) of 60.0 to 69.9 in adult [facility]) Diet goal: - Patient was educated on daily caloric intake monitoring. She was provided with the calorie calculator, encouraged to input weekly weight and to calculator to establish calorie intake goal. - She was encouraged to download and have such as Myfitnesspal onto her phone so that she can closely monitor her calories. - She is to continue drinking at least 100 ounces of water daily. - She was encouraged to incorporate a low glycemic index fruit for breakfast and at least 2 vegetables for lunch. - She was educated on dinner, her play should be primarily a vegetable source. With small portion of protein. - She was encouraged to avoid high glycemic index starches such as white rice, pasta, potatoes or bread. - Education on Mediterranean and paleo  diet provided. - Patient to eat 3 meals a day. Physical activity goal: - Patient was provided contact information for the PREP program. She and her family is a member already, so this would be something easier for her to incorporate in her life. - Patient was educated on goal heart rate. She is to start maximizing her exercise by reaching goal heart rate for 30 minutes at a time for 4 days. In 2 weeks she is to increase 150 minutes a week. Encouraged 1 day of rest. Behavioral intervention: - PREP program will be helpful. - Patient also encouraged to take day of rest and use it to prepare meals and at bedtime for the week. Pharmacotherapy: -  Naltrexone-Bupropion HCl ER 8-90 MG TB12; 1 tab in the morning 7 days, then 1 tab BID 7 days, then 2 tabs in the morning and 1 tab afternoon 7 days, then 2 tabs BID.  Dispense: 90 tablet; Refill: 0 Follow-up 4 weeks   Reviewed expectations re: course of current medical issues.  Discussed self-management of symptoms.  Outlined signs and symptoms indicating need for more acute  intervention.  Patient verbalized understanding and all questions were answered.  Patient received an After-Visit Summary.    No orders of the defined types were placed in this encounter.    Note is dictated utilizing voice recognition software. Although note has been proof read prior to signing, occasional typographical errors still can be missed. If any questions arise, please do not hesitate to call for verification.   electronically signed by:  Felix Pacinienee Kuneff, DO  [facility] - OR

## 2017-10-08 NOTE — Patient Instructions (Signed)
https://www.calculator.net/calorie-calculator.html--> check calorie need every week with weigh in.    Download my fittness pal app to track calories.  Try to get a fitbit or like object to track steps, activity and heart rate. Target heart rate for you is about 140-160 for a good cardio work out.  Start with 4- 30 minute session a week for 2 weeks and then increase to > 150 minutes a week by week 3.   PREP program sign up.   3  Meals a day. Make target calories.   Fresh fruits (citrus or berries), vegetables, lean meats or fishes.   Avoid high starch: white pasta, potato, rice and bread.

## 2017-10-12 ENCOUNTER — Encounter: Payer: Self-pay | Admitting: Family Medicine

## 2017-10-12 DIAGNOSIS — Z6841 Body Mass Index (BMI) 40.0 and over, adult: Principal | ICD-10-CM

## 2017-10-16 NOTE — Progress Notes (Signed)
Clearance Dr Claiborne BillingsKuneff 09-22-17 epic  HGBA1C 09-14-17 epic

## 2017-10-16 NOTE — Patient Instructions (Signed)
Teresa Osborne  10/16/2017   Your procedure is scheduled on: 10-27-17  [facility]  [facility] [place]  elevators to 3rd floor to  Short Stay Center at 10AM.    Call this number if you have problems the morning of surgery [phone]    Remember: ONLY 1 PERSON MAY GO WITH YOU TO SHORT STAY TO GET  READY MORNING OF YOUR SURGERY.  Do not eat food or drink liquids :After Midnight.     Take these medicines the morning of surgery with A SIP OF WATER: none               You may not have any metal on your body including hair pins and              piercings  Do not wear jewelry, make-up, lotions, powders or perfumes, deodorant             Do not wear nail polish.  Do not shave  48 hours prior to surgery.     Do not bring valuables to the hospital. [facility] IS NOT             RESPONSIBLE   FOR VALUABLES.  Contacts, dentures or bridgework may not be worn into surgery.  Leave suitcase in the car. After surgery it may be brought to your room.                  Please read over the following fact sheets you were given: _____________________________________________________________________             [facility] - Preparing for [facility] Before surgery, you can play an important role.  Because skin is not sterile, your skin needs to be as free of germs as possible.  You can reduce the number of germs on your skin by washing with CHG (chlorahexidine gluconate) soap before surgery.  CHG is an antiseptic cleaner which kills germs and bonds with the skin to continue killing germs even after washing. Please DO NOT use if you have an allergy to CHG or antibacterial soaps.  If your skin becomes reddened/irritated stop using the CHG and inform your nurse when you arrive at Short Stay. Do not shave (including legs and underarms) for at least 48 hours prior to the first CHG shower.  You may shave your face/neck. Please follow these instructions carefully:  1.   Shower with CHG Soap the night before surgery and the  morning of Surgery.  2.  If you choose to wash your hair, wash your hair first as usual with your  normal  shampoo.  3.  After you shampoo, rinse your hair and body thoroughly to remove the  shampoo.                           4.  Use CHG as you would any other liquid soap.  You can apply chg directly  to the skin and wash                       Gently with a scrungie or clean washcloth.  5.  Apply the CHG Soap to your body ONLY FROM THE NECK DOWN.   Do not use on face/ open  Wound or open sores. Avoid contact with eyes, ears mouth and genitals (private parts).                       Wash face,  Genitals (private parts) with your normal soap.             6.  Wash thoroughly, paying special attention to the area where your surgery  will be performed.  7.  Thoroughly rinse your body with warm water from the neck down.  8.  DO NOT shower/wash with your normal soap after using and rinsing off  the CHG Soap.                9.  Pat yourself dry with a clean towel.            10.  Wear clean pajamas.            11.  Place clean sheets on your bed the night of your first shower and do not  sleep with pets. Day of Surgery : Do not apply any lotions/deodorants the morning of surgery.  Please wear clean clothes to the hospital/surgery center.  FAILURE TO FOLLOW THESE INSTRUCTIONS MAY RESULT IN THE CANCELLATION OF YOUR SURGERY PATIENT SIGNATURE_________________________________  NURSE SIGNATURE__________________________________  ________________________________________________________________________

## 2017-10-20 ENCOUNTER — Other Ambulatory Visit: Payer: Self-pay

## 2017-10-20 ENCOUNTER — Encounter (HOSPITAL_COMMUNITY)
Admission: RE | Admit: 2017-10-20 | Discharge: 2017-10-20 | Disposition: A | Discharge status: Home / Self Care | Payer: 59 | Source: Ambulatory Visit | Attending: Urology | Admitting: Urology

## 2017-10-20 ENCOUNTER — Encounter (HOSPITAL_COMMUNITY): Payer: Self-pay

## 2017-10-20 DIAGNOSIS — N200 Calculus of kidney: Secondary | ICD-10-CM | POA: Insufficient documentation

## 2017-10-20 DIAGNOSIS — Z01812 Encounter for preprocedural laboratory examination: Secondary | ICD-10-CM | POA: Diagnosis present

## 2017-10-20 HISTORY — DX: Prediabetes: R73.03

## 2017-10-20 LAB — BASIC METABOLIC PANEL
ANION GAP: 8 (ref 5–15)
BUN: 13 mg/dL (ref 6–20)
CO2: 25 mmol/L (ref 22–32)
Calcium: 9.1 mg/dL (ref 8.9–10.3)
Chloride: 104 mmol/L (ref 101–111)
Creatinine, Ser: 1.16 mg/dL — ABNORMAL HIGH (ref 0.44–1.00)
GFR calc Af Amer: >60 mL/min (ref >60)
GFR, EST NON AFRICAN AMERICAN: 58 mL/min — AB (ref >60)
Glucose, Bld: 88 mg/dL (ref 65–99)
POTASSIUM: 4.1 mmol/L (ref 3.5–5.1)
SODIUM: 137 mmol/L (ref 135–145)

## 2017-10-20 LAB — CBC
HCT: 36.7 % (ref 36.0–46.0)
Hemoglobin: 11.8 g/dL — ABNORMAL LOW (ref 12.0–15.0)
MCH: 27.6 pg (ref 26.0–34.0)
MCHC: 32.2 g/dL (ref 30.0–36.0)
MCV: 85.9 fL (ref 78.0–100.0)
PLATELETS: 418 10*3/uL — AB (ref 150–400)
RBC: 4.27 MIL/uL (ref 3.87–5.11)
RDW: 14.7 % (ref 11.5–15.5)
WBC: 7 10*3/uL (ref 4.0–10.5)

## 2017-10-20 LAB — HCG, SERUM, QUALITATIVE: PREG SERUM: NEGATIVE

## 2017-10-26 MED ORDER — DEXTROSE 5 % IV SOLN
3.0000 g | INTRAVENOUS | Status: AC
  Administered 2017-10-27: 3 g via INTRAVENOUS
  Filled 2017-10-26: qty 3

## 2017-10-27 ENCOUNTER — Ambulatory Visit (HOSPITAL_COMMUNITY): Payer: 59 | Admitting: Registered Nurse

## 2017-10-27 ENCOUNTER — Encounter (HOSPITAL_COMMUNITY)
Admission: AD | Disposition: A | Discharge status: Home Health | Payer: Self-pay | Source: Ambulatory Visit | Attending: Urology

## 2017-10-27 ENCOUNTER — Other Ambulatory Visit: Payer: Self-pay

## 2017-10-27 ENCOUNTER — Encounter (HOSPITAL_COMMUNITY): Payer: Self-pay | Admitting: *Deleted

## 2017-10-27 ENCOUNTER — Inpatient Hospital Stay (HOSPITAL_COMMUNITY)
Admission: AD | Admit: 2017-10-27 | Discharge: 2017-10-29 | DRG: 660 | Disposition: A | Discharge status: Home Health | Payer: 59 | Source: Ambulatory Visit | Attending: Urology | Admitting: Urology

## 2017-10-27 ENCOUNTER — Ambulatory Visit (HOSPITAL_COMMUNITY): Payer: 59

## 2017-10-27 DIAGNOSIS — Z825 Family history of asthma and other chronic lower respiratory diseases: Secondary | ICD-10-CM | POA: Diagnosis not present

## 2017-10-27 DIAGNOSIS — Z8261 Family history of arthritis: Secondary | ICD-10-CM

## 2017-10-27 DIAGNOSIS — N136 Pyonephrosis: Secondary | ICD-10-CM | POA: Diagnosis present

## 2017-10-27 DIAGNOSIS — Z6841 Body Mass Index (BMI) 40.0 and over, adult: Secondary | ICD-10-CM

## 2017-10-27 DIAGNOSIS — Z810 Family history of intellectual disabilities: Secondary | ICD-10-CM

## 2017-10-27 DIAGNOSIS — Z809 Family history of malignant neoplasm, unspecified: Secondary | ICD-10-CM | POA: Diagnosis not present

## 2017-10-27 DIAGNOSIS — R109 Unspecified abdominal pain: Secondary | ICD-10-CM | POA: Diagnosis present

## 2017-10-27 DIAGNOSIS — Z87442 Personal history of urinary calculi: Secondary | ICD-10-CM | POA: Diagnosis not present

## 2017-10-27 DIAGNOSIS — Z8249 Family history of ischemic heart disease and other diseases of the circulatory system: Secondary | ICD-10-CM

## 2017-10-27 DIAGNOSIS — N200 Calculus of kidney: Principal | ICD-10-CM | POA: Diagnosis present

## 2017-10-27 DIAGNOSIS — Z800 Family history of malignant neoplasm of digestive organs: Secondary | ICD-10-CM

## 2017-10-27 HISTORY — PX: NEPHROSTOMY: SHX1014

## 2017-10-27 HISTORY — PX: CYSTOSCOPY: SHX5120

## 2017-10-27 LAB — CBC
HEMATOCRIT: 35.7 % — AB (ref 36.0–46.0)
Hemoglobin: 11.3 g/dL — ABNORMAL LOW (ref 12.0–15.0)
MCH: 27.4 pg (ref 26.0–34.0)
MCHC: 31.7 g/dL (ref 30.0–36.0)
MCV: 86.4 fL (ref 78.0–100.0)
Platelets: 347 10*3/uL (ref 150–400)
RBC: 4.13 MIL/uL (ref 3.87–5.11)
RDW: 15.1 % (ref 11.5–15.5)
WBC: 8 10*3/uL (ref 4.0–10.5)

## 2017-10-27 LAB — BASIC METABOLIC PANEL
ANION GAP: 7 (ref 5–15)
BUN: 9 mg/dL (ref 6–20)
CALCIUM: 8.7 mg/dL — AB (ref 8.9–10.3)
CO2: 25 mmol/L (ref 22–32)
CREATININE: 1.09 mg/dL — AB (ref 0.44–1.00)
Chloride: 102 mmol/L (ref 101–111)
GFR calc Af Amer: >60 mL/min (ref >60)
GFR calc non Af Amer: >60 mL/min (ref >60)
GLUCOSE: 128 mg/dL — AB (ref 65–99)
Potassium: 4 mmol/L (ref 3.5–5.1)
Sodium: 134 mmol/L — ABNORMAL LOW (ref 135–145)

## 2017-10-27 LAB — PREGNANCY, URINE: PREG TEST UR: NEGATIVE

## 2017-10-27 SURGERY — CYSTOSCOPY, FLEXIBLE
Anesthesia: General | Laterality: Left

## 2017-10-27 MED ORDER — PROPOFOL 10 MG/ML IV BOLUS
INTRAVENOUS | Status: DC | PRN
  Administered 2017-10-27: 280 mg via INTRAVENOUS

## 2017-10-27 MED ORDER — IOHEXOL 300 MG/ML  SOLN
INTRAMUSCULAR | Status: DC | PRN
  Administered 2017-10-27: 215 mL

## 2017-10-27 MED ORDER — PROPOFOL 10 MG/ML IV BOLUS
INTRAVENOUS | Status: AC
  Filled 2017-10-27: qty 20

## 2017-10-27 MED ORDER — ROCURONIUM BROMIDE 50 MG/5ML IV SOSY
PREFILLED_SYRINGE | INTRAVENOUS | Status: AC
  Filled 2017-10-27: qty 5

## 2017-10-27 MED ORDER — ROCURONIUM BROMIDE 10 MG/ML (PF) SYRINGE
PREFILLED_SYRINGE | INTRAVENOUS | Status: DC | PRN
  Administered 2017-10-27: 20 mg via INTRAVENOUS
  Administered 2017-10-27: 50 mg via INTRAVENOUS

## 2017-10-27 MED ORDER — SUCCINYLCHOLINE CHLORIDE 200 MG/10ML IV SOSY
PREFILLED_SYRINGE | INTRAVENOUS | Status: AC
  Filled 2017-10-27: qty 10

## 2017-10-27 MED ORDER — HYDROMORPHONE HCL 1 MG/ML IJ SOLN: 0.2500 mg | INTRAMUSCULAR | Status: DC | PRN

## 2017-10-27 MED ORDER — MENTHOL 3 MG MT LOZG
1.0000 | LOZENGE | OROMUCOSAL | Status: DC | PRN
  Filled 2017-10-27: qty 9

## 2017-10-27 MED ORDER — ONDANSETRON HCL 4 MG/2ML IJ SOLN
INTRAMUSCULAR | Status: AC
  Filled 2017-10-27: qty 2

## 2017-10-27 MED ORDER — LACTATED RINGERS IV SOLN
INTRAVENOUS | Status: DC
  Administered 2017-10-27: 11:00:00 via INTRAVENOUS

## 2017-10-27 MED ORDER — DIPHENHYDRAMINE HCL 12.5 MG/5ML PO ELIX: 12.5000 mg | ORAL_SOLUTION | Freq: Four times a day (QID) | ORAL | Status: DC | PRN

## 2017-10-27 MED ORDER — DIPHENHYDRAMINE HCL 50 MG/ML IJ SOLN: 12.5000 mg | Freq: Four times a day (QID) | INTRAMUSCULAR | Status: DC | PRN

## 2017-10-27 MED ORDER — SODIUM CHLORIDE 0.9 % IV SOLN
INTRAVENOUS | Status: DC
  Administered 2017-10-27 – 2017-10-29 (×5): via INTRAVENOUS

## 2017-10-27 MED ORDER — SUGAMMADEX SODIUM 200 MG/2ML IV SOLN
INTRAVENOUS | Status: DC | PRN
  Administered 2017-10-27: 400 mg via INTRAVENOUS

## 2017-10-27 MED ORDER — PHENOL 1.4 % MT LIQD
1.0000 | OROMUCOSAL | Status: DC | PRN
  Filled 2017-10-27: qty 177

## 2017-10-27 MED ORDER — FENTANYL CITRATE (PF) 100 MCG/2ML IJ SOLN
INTRAMUSCULAR | Status: AC
  Filled 2017-10-27: qty 2

## 2017-10-27 MED ORDER — ZOLPIDEM TARTRATE 5 MG PO TABS: 5.0000 mg | ORAL_TABLET | Freq: Every evening | ORAL | Status: DC | PRN

## 2017-10-27 MED ORDER — DEXAMETHASONE SODIUM PHOSPHATE 10 MG/ML IJ SOLN
INTRAMUSCULAR | Status: AC
  Filled 2017-10-27: qty 1

## 2017-10-27 MED ORDER — OXYCODONE-ACETAMINOPHEN 5-325 MG PO TABS
1.0000 | ORAL_TABLET | ORAL | Status: DC | PRN
  Administered 2017-10-27: 2 via ORAL
  Administered 2017-10-28 – 2017-10-29 (×2): 1 via ORAL
  Filled 2017-10-27: qty 2
  Filled 2017-10-27 (×2): qty 1

## 2017-10-27 MED ORDER — MIDAZOLAM HCL 5 MG/5ML IJ SOLN
INTRAMUSCULAR | Status: DC | PRN
  Administered 2017-10-27 (×2): 1 mg via INTRAVENOUS

## 2017-10-27 MED ORDER — OXYCODONE HCL 5 MG PO TABS: 5.0000 mg | ORAL_TABLET | Freq: Once | ORAL | Status: DC | PRN

## 2017-10-27 MED ORDER — ONDANSETRON HCL 4 MG/2ML IJ SOLN
INTRAMUSCULAR | Status: DC | PRN
  Administered 2017-10-27: 4 mg via INTRAVENOUS

## 2017-10-27 MED ORDER — SUGAMMADEX SODIUM 500 MG/5ML IV SOLN
INTRAVENOUS | Status: AC
  Filled 2017-10-27: qty 5

## 2017-10-27 MED ORDER — DEXAMETHASONE SODIUM PHOSPHATE 10 MG/ML IJ SOLN
INTRAMUSCULAR | Status: DC | PRN
  Administered 2017-10-27: 10 mg via INTRAVENOUS

## 2017-10-27 MED ORDER — HYOSCYAMINE SULFATE 0.125 MG SL SUBL
0.1250 mg | SUBLINGUAL_TABLET | SUBLINGUAL | Status: DC | PRN
  Filled 2017-10-27: qty 1

## 2017-10-27 MED ORDER — HYDROMORPHONE HCL 1 MG/ML IJ SOLN: 0.5000 mg | INTRAMUSCULAR | Status: DC | PRN

## 2017-10-27 MED ORDER — SODIUM CHLORIDE 0.9 % IR SOLN
Status: DC | PRN
  Administered 2017-10-27: 3000 mL

## 2017-10-27 MED ORDER — ONDANSETRON HCL 4 MG/2ML IJ SOLN
4.0000 mg | INTRAMUSCULAR | Status: DC | PRN
  Administered 2017-10-27: 4 mg via INTRAVENOUS
  Filled 2017-10-27: qty 2

## 2017-10-27 MED ORDER — LIDOCAINE 2% (20 MG/ML) 5 ML SYRINGE
INTRAMUSCULAR | Status: DC | PRN
  Administered 2017-10-27: 100 mg via INTRAVENOUS

## 2017-10-27 MED ORDER — PIPERACILLIN-TAZOBACTAM 3.375 G IVPB
3.3750 g | Freq: Three times a day (TID) | INTRAVENOUS | Status: DC
  Administered 2017-10-27 – 2017-10-29 (×6): 3.375 g via INTRAVENOUS
  Filled 2017-10-27 (×6): qty 50

## 2017-10-27 MED ORDER — PROMETHAZINE HCL 25 MG/ML IJ SOLN: 6.2500 mg | INTRAMUSCULAR | Status: DC | PRN

## 2017-10-27 MED ORDER — SUCCINYLCHOLINE CHLORIDE 200 MG/10ML IV SOSY
PREFILLED_SYRINGE | INTRAVENOUS | Status: DC | PRN
  Administered 2017-10-27: 120 mg via INTRAVENOUS

## 2017-10-27 MED ORDER — STERILE WATER FOR IRRIGATION IR SOLN
Status: DC | PRN
  Administered 2017-10-27: 3000 mL

## 2017-10-27 MED ORDER — MIDAZOLAM HCL 2 MG/2ML IJ SOLN
INTRAMUSCULAR | Status: AC
  Filled 2017-10-27: qty 2

## 2017-10-27 MED ORDER — OXYCODONE HCL 5 MG/5ML PO SOLN
5.0000 mg | Freq: Once | ORAL | Status: DC | PRN
  Filled 2017-10-27: qty 5

## 2017-10-27 MED ORDER — FENTANYL CITRATE (PF) 100 MCG/2ML IJ SOLN
INTRAMUSCULAR | Status: DC | PRN
  Administered 2017-10-27 (×2): 25 ug via INTRAVENOUS
  Administered 2017-10-27 (×2): 50 ug via INTRAVENOUS

## 2017-10-27 MED ORDER — LIDOCAINE 2% (20 MG/ML) 5 ML SYRINGE
INTRAMUSCULAR | Status: AC
  Filled 2017-10-27: qty 5

## 2017-10-27 SURGICAL SUPPLY — 69 items
APL SKNCLS STERI-STRIP NONHPOA (GAUZE/BANDAGES/DRESSINGS) ×2
BAG URINE DRAINAGE (UROLOGICAL SUPPLIES) ×6 IMPLANT
BASKET STONE NITINOL 3FRX115MB (UROLOGICAL SUPPLIES) IMPLANT
BASKET ZERO TIP NITINOL 2.4FR (BASKET) IMPLANT
BENZOIN TINCTURE PRP APPL 2/3 (GAUZE/BANDAGES/DRESSINGS) ×4 IMPLANT
BLADE SURG 15 STRL LF DISP TIS (BLADE) ×2 IMPLANT
BLADE SURG 15 STRL SS (BLADE) ×3
BSKT STON RTRVL ZERO TP 2.4FR (BASKET)
CATCHER STONE W/TUBE ADAPTER (UROLOGICAL SUPPLIES) IMPLANT
CATH FOLEY 2W COUNCIL 20FR 5CC (CATHETERS) IMPLANT
CATH FOLEY 2WAY SLVR  5CC 16FR (CATHETERS) ×1
CATH FOLEY 2WAY SLVR  5CC 18FR (CATHETERS) ×1
CATH FOLEY 2WAY SLVR 5CC 16FR (CATHETERS) ×2 IMPLANT
CATH FOLEY 2WAY SLVR 5CC 18FR (CATHETERS) ×2 IMPLANT
CATH IMAGER II 65CM (CATHETERS) ×2 IMPLANT
CATH INTERMIT  6FR 70CM (CATHETERS) ×3 IMPLANT
CATH ROBINSON RED A/P 20FR (CATHETERS) IMPLANT
CATH ULTRATHANE 14FR (CATHETERS) ×1 IMPLANT
CATH URET DUAL LUMEN 6-10FR 50 (CATHETERS) IMPLANT
CATH UROLOGY TORQUE 40 (MISCELLANEOUS) ×2 IMPLANT
CATH X-FORCE N30 NEPHROSTOMY (TUBING) ×1 IMPLANT
COVER SURGICAL LIGHT HANDLE (MISCELLANEOUS) ×3 IMPLANT
DRAPE C-ARM 42X120 X-RAY (DRAPES) ×3 IMPLANT
DRAPE LINGEMAN PERC (DRAPES) ×3 IMPLANT
DRAPE SHEET LG 3/4 BI-LAMINATE (DRAPES) ×3 IMPLANT
DRAPE SURG IRRIG POUCH 19X23 (DRAPES) ×3 IMPLANT
DRSG PAD ABDOMINAL 8X10 ST (GAUZE/BANDAGES/DRESSINGS) ×6 IMPLANT
DRSG TEGADERM 8X12 (GAUZE/BANDAGES/DRESSINGS) ×6 IMPLANT
FIBER LASER FLEXIVA 1000 (UROLOGICAL SUPPLIES) IMPLANT
FIBER LASER FLEXIVA 365 (UROLOGICAL SUPPLIES) IMPLANT
FIBER LASER FLEXIVA 550 (UROLOGICAL SUPPLIES) IMPLANT
FIBER LASER TRAC TIP (UROLOGICAL SUPPLIES) IMPLANT
GAUZE SPONGE 4X4 12PLY STRL (GAUZE/BANDAGES/DRESSINGS) ×4 IMPLANT
GLOVE BIO SURGEON STRL SZ8 (GLOVE) ×7 IMPLANT
GLOVE BIOGEL PI IND STRL 8 (GLOVE) ×1 IMPLANT
GLOVE BIOGEL PI INDICATOR 8 (GLOVE) ×1
GOWN STRL REUS W/TWL LRG LVL3 (GOWN DISPOSABLE) ×6 IMPLANT
GOWN STRL REUS W/TWL XL LVL3 (GOWN DISPOSABLE) ×4 IMPLANT
GUIDEWIRE STR DUAL SENSOR (WIRE) ×6 IMPLANT
HLDR NDL AMPLATZ W/INSERTS (MISCELLANEOUS) IMPLANT
HOLDER NEEDLE AMPLATZ W/INSERT (MISCELLANEOUS) ×3 IMPLANT
IV SET EXTENSION CATH 6 NF (IV SETS) ×3 IMPLANT
KIT BASIN OR (CUSTOM PROCEDURE TRAY) ×3 IMPLANT
MANIFOLD NEPTUNE II (INSTRUMENTS) ×3 IMPLANT
NDL TROCAR 18X15 ECHO (NEEDLE) IMPLANT
NDL TROCAR 18X20 (NEEDLE) IMPLANT
NEEDLE TROCAR 18X15 ECHO (NEEDLE) IMPLANT
NEEDLE TROCAR 18X20 (NEEDLE) ×3 IMPLANT
NS IRRIG 1000ML POUR BTL (IV SOLUTION) ×2 IMPLANT
PACK CYSTO (CUSTOM PROCEDURE TRAY) ×3 IMPLANT
PROBE LITHOCLAST ULTRA 3.8X403 (UROLOGICAL SUPPLIES) IMPLANT
PROBE PNEUMATIC 1.0MMX570MM (UROLOGICAL SUPPLIES) IMPLANT
SET AMPLATZ RENAL DILATOR (MISCELLANEOUS) IMPLANT
SET IRRIG Y TYPE TUR BLADDER L (SET/KITS/TRAYS/PACK) ×3 IMPLANT
SHEATH PEELAWAY SET 9 (SHEATH) ×2 IMPLANT
SHEATH X FORCE 10MMX22CM (SHEATH) ×2 IMPLANT
SPONGE LAP 4X18 X RAY DECT (DISPOSABLE) ×3 IMPLANT
STENT CONTOUR 6FRX26X.038 (STENTS) IMPLANT
STONE CATCHER W/TUBE ADAPTER (UROLOGICAL SUPPLIES) IMPLANT
SUT SILK 2 0 30  PSL (SUTURE) ×1
SUT SILK 2 0 30 PSL (SUTURE) ×2 IMPLANT
SYR 10ML LL (SYRINGE) ×3 IMPLANT
SYR 20CC LL (SYRINGE) ×6 IMPLANT
SYR 50ML LL SCALE MARK (SYRINGE) ×3 IMPLANT
TAPE CLOTH SURG 4X10 WHT LF (GAUZE/BANDAGES/DRESSINGS) ×2 IMPLANT
TOWEL OR 17X26 10 PK STRL BLUE (TOWEL DISPOSABLE) ×3 IMPLANT
TUBE CONNECTING VINYL 14FR 30C (MISCELLANEOUS) ×1 IMPLANT
TUBING CONNECTING 10 (TUBING) ×6 IMPLANT
WATER STERILE IRR 1000ML POUR (IV SOLUTION) ×1 IMPLANT

## 2017-10-27 NOTE — H&P (Signed)
Urology Admission H&P  Chief Complaint: left flank pain  History of Present Illness: [name] is a 41yo with a left 3.5cm partial staghorn calculus here for definitive treatment  Past Medical History:  Diagnosis Date  . Anxiety   . Family history of adverse reaction to anesthesia   . Kidney stones    Staghorn  . Pre-diabetes    Past Surgical History:  Procedure Laterality Date  . NO PAST SURGERIES      Home Medications:  Current Facility-Administered Medications  Medication Dose Route Frequency Provider Last Rate Last Dose  . ceFAZolin (ANCEF) 3 g in dextrose 5 % 50 mL IVPB  3 g Intravenous 30 min Pre-Op Xitlaly Ault, Mardene CelestePatrick L, MD      . lactated ringers infusion   Intravenous Continuous Lowella CurbMiller, Warren Ray, MD 20 mL/hr at 10/27/17 1037     Allergies: Not on File  Family History  Problem Relation Age of Onset  . Cancer Paternal Grandfather        pancreatic  . Alcohol abuse Paternal Grandfather   . Arthritis Paternal Grandfather   . COPD Paternal Grandfather   . Early death Paternal Grandfather   . Cancer Paternal Grandmother        unknown, metz  . Arthritis Paternal Grandmother   . COPD Paternal Grandmother   . Early death Paternal Grandmother   . Arthritis Father   . Mental retardation Father   . Arthritis Maternal Grandmother   . COPD Maternal Grandfather   . Heart disease Maternal Grandfather   . Cancer Sister [id number]       ovarian   Social History:  reports that  has never smoked. she has never used smokeless tobacco. She reports that she does not drink alcohol or use drugs.  Review of Systems  Genitourinary: Positive for flank pain, frequency and urgency.  All other systems reviewed and are negative.   Physical Exam:  Vital signs in last 24 hours: Temp:  [97.6 F (36.4 C)] 97.6 F (36.4 C) (11/27 1010) Pulse Rate:  [115] 115 (11/27 1010) Resp:  [16] 16 (11/27 1010) BP: (143)/(95) 143/95 (11/27 1010) SpO2:  [100 %] 100 % (11/27 1010) Weight:  [184.2 kg  (406 lb)] 184.2 kg (406 lb) (11/27 1035) Physical Exam  Constitutional: She is oriented to person, place, and time. She appears well-developed and well-nourished.  HENT:  Head: Normocephalic and atraumatic.  Eyes: EOM are normal. Pupils are equal, round, and reactive to light.  Neck: Normal range of motion. No thyromegaly present.  Cardiovascular: Normal rate and regular rhythm.  Respiratory: Effort normal. No respiratory distress.  GI: Soft. She exhibits no distension.  Musculoskeletal: Normal range of motion. She exhibits no edema.  Neurological: She is alert and oriented to person, place, and time.  Skin: Skin is warm and dry.  Psychiatric: She has a normal mood and affect. Her behavior is normal. Judgment and thought content normal.    Laboratory Data:  Results for orders placed or performed during the hospital encounter of 10/27/17 (from the past 24 hour(s))  Pregnancy, urine     Status: None   Collection Time: 10/27/17 10:38 AM  Result Value Ref Range   Preg Test, Ur NEGATIVE NEGATIVE   No results found for this or any previous visit (from the past 240 hour(s)). Creatinine: No results for input(s): CREATININE in the last 168 hours. Baseline Creatinine: unknown  Impression/Assessment:  41yo with left partial staghorn calculus  Plan:  The risks/benefits/alternatives to L PCNL was explained  to the patient and she understands and wishes to proceed with surgery  Wilkie Ayeatrick Terrion Poblano 10/27/2017, 12:22 PM

## 2017-10-27 NOTE — Op Note (Signed)
.  Preoperative diagnosis: Left renal stone  Postoperative diagnosis: left renal stone, pyonephrosis  Procedure 1.  Cystoscopy 2.  Left nephrostogram 3.  Intraoperative fluoroscopy, under 1 hour, with interpretation 4.  Percutaneous access into the left renal collecting system 5.  Placement of a 4288 French nephrostomy tube 6.  Dilation of percutaneous tract  Attending: Dr. Ronne BinningMckenzie  Anesthesia: General  Estimated blood loss: 10cc  Antibiotics: ancef  Drains: 1.  16 French Foley catheter 2. 8 French nephrostomy tube  Specimens: urine for culture  Findings: 1. Purulent drainage from catheter during percutaneous access into the mid pole of the left kidney 2. Complete left staghorn calculus on nephrostogram  Indications: Patient is a 41 year old female/female with a history of large left renal stone.  After discussing treatment options and they decided to proceed with left percutaneous nephrostolithotomy.    Procedure in detail: Prior to procedure consent was obtained.  Patient was brought to the operating room and a timeout was done to ensure correct patient, correct procedure, and correct site.  General anesthesia was administered. The patients genetalia was prepped and draped. A flexible cystoscope was passed into the urethra and then the bladder. No masses or lesions were seen in the bladder. Ureteral orifices were in the normal anatomic location. A sensor wire was passed into the left ureter and up to the renal pelvis. A 6 french ureteral catheter was advanced over the wire and up to the renal pelvis the wire was then removed and so was the cystoscope.  A 16 French Foley catheter was in place and the ureteral catheter was secured with a 0 silk tie.  The patient was then placed in the prone position.   The left flank was then prepped and draped in usual sterile fashion.  Contrast was instilled throught the ureteral catheter and the bullseye technique was used to gain access into the mid pole.  Upon removing the inner sheath of the needle pus drained. This was sent for culture and we elected to place a nephrostomy tube. Once we gained access a sensor wire was advanced through the spinal needle and down the ureter to the bladder. We then used an 8 french and 10 french dilater to dilate the tract. We then used a sheath to place a second wire down to the bladder.  We then advanced an 8 french nephrostomy tube into the left renal pelvis. The wire was then removed and good coil was noted in the renal pelvis under fluoroscopy.  We obtained a nephrostogram and noted minimal extravasation of contrast.  We then secured the nephrostomy tube with 0 silks in interrupted fashion.  Dressing was placed over the nephrostomy tube site and this then concluded the procedure was well-tolerated by the patient.  Complications: None  Condition: Stable, extubated, transferred to PACU  Plan: Patient is to be admitted overnight for observation.  She is going to be started on broad spectrum antibiotics

## 2017-10-27 NOTE — Anesthesia Procedure Notes (Signed)
Procedure Name: Intubation Date/Time: 10/27/2017 1:05 PM Performed by: Victoriano Lain, CRNA Pre-anesthesia Checklist: Patient identified, Emergency Drugs available, Suction available, Patient being monitored and Timeout performed Patient Re-evaluated:Patient Re-evaluated prior to induction Oxygen Delivery Method: Circle system utilized Preoxygenation: Pre-oxygenation with 100% oxygen Induction Type: IV induction Ventilation: Mask ventilation without difficulty and Two handed mask ventilation required Laryngoscope Size: Mac and 4 Grade View: Grade I Tube type: Oral Tube size: 7.5 mm Number of attempts: 1 Airway Equipment and Method: Stylet Placement Confirmation: ETT inserted through vocal cords under direct vision,  positive ETCO2 and breath sounds checked- equal and bilateral Secured at: 22 cm Tube secured with: Tape Dental Injury: Teeth and Oropharynx as per pre-operative assessment

## 2017-10-27 NOTE — Anesthesia Postprocedure Evaluation (Signed)
Anesthesia Post Note  Patient: Teresa Osborne  Procedure(s) Performed: CYSTOSCOPY FLEXIBLE NEPHROSTOMY TUBE PLACEMENT (Left )     Patient location during evaluation: PACU Anesthesia Type: General Level of consciousness: awake and alert Pain management: pain level controlled Vital Signs Assessment: post-procedure vital signs reviewed and stable Respiratory status: spontaneous breathing, nonlabored ventilation and respiratory function stable Cardiovascular status: blood pressure returned to baseline and stable Postop Assessment: no apparent nausea or vomiting Anesthetic complications: no    Last Vitals:  Vitals:   10/27/17 1600 10/27/17 1610  BP: (!) 146/82 (!) 143/68  Pulse: 88 88  Resp:  18  Temp: (!) 36.2 C 36.7 C  SpO2: 100% 100%    Last Pain:  Vitals:   10/27/17 1610  TempSrc: Axillary                 Teresa Osborne

## 2017-10-27 NOTE — Anesthesia Preprocedure Evaluation (Signed)
Anesthesia Evaluation  Patient identified by MRN, date of birth, ID band Patient awake    Reviewed: Allergy & Precautions, NPO status , Patient's Chart, lab work & pertinent test results  Airway Mallampati: II  TM Distance: >3 FB Neck ROM: Full    Dental no notable dental hx.    Pulmonary neg pulmonary ROS,    Pulmonary exam normal breath sounds clear to auscultation       Cardiovascular negative cardio ROS Normal cardiovascular exam Rhythm:Regular Rate:Normal     Neuro/Psych Anxiety negative neurological ROS  negative psych ROS   GI/Hepatic negative GI ROS, Neg liver ROS,   Endo/Other  negative endocrine ROSMorbid obesity  Renal/GU negative Renal ROS  negative genitourinary   Musculoskeletal negative musculoskeletal ROS (+)   Abdominal   Peds negative pediatric ROS (+)  Hematology negative hematology ROS (+)   Anesthesia Other Findings   Reproductive/Obstetrics negative OB ROS                             Anesthesia Physical Anesthesia Plan  ASA: III  Anesthesia Plan: General   Post-op Pain Management:    Induction: Intravenous  PONV Risk Score and Plan: 3 and Ondansetron, Dexamethasone and Midazolam  Airway Management Planned: Oral ETT  Additional Equipment:   Intra-op Plan:   Post-operative Plan: Extubation in OR  Informed Consent: I have reviewed the patients History and Physical, chart, labs and discussed the procedure including the risks, benefits and alternatives for the proposed anesthesia with the patient or authorized representative who has indicated his/her understanding and acceptance.   Dental advisory given  Plan Discussed with: CRNA  Anesthesia Plan Comments:         Anesthesia Quick Evaluation

## 2017-10-27 NOTE — Transfer of Care (Signed)
Immediate Anesthesia Transfer of Care Note  Patient: Judi SaaBeverly Dolezal  Procedure(s) Performed: CYSTOSCOPY FLEXIBLE NEPHROSTOMY TUBE PLACEMENT (Left )  Patient Location: PACU  Anesthesia Type:General  Level of Consciousness: awake, alert , oriented and patient cooperative  Airway & Oxygen Therapy: Patient Spontanous Breathing and Patient connected to face mask oxygen  Post-op Assessment: Report given to RN, Post -op Vital signs reviewed and stable and Patient moving all extremities  Post vital signs: Reviewed and stable  Last Vitals:  Vitals:   10/27/17 1010  BP: (!) 143/95  Pulse: (!) 115  Resp: 16  Temp: 36.4 C  SpO2: 100%    Last Pain:  Vitals:   10/27/17 1010  TempSrc: Oral      Patients Stated Pain Goal: 4 (10/27/17 1200)  Complications: No apparent anesthesia complications

## 2017-10-28 LAB — BASIC METABOLIC PANEL
ANION GAP: 4 — AB (ref 5–15)
BUN: 9 mg/dL (ref 6–20)
CHLORIDE: 107 mmol/L (ref 101–111)
CO2: 26 mmol/L (ref 22–32)
Calcium: 8.8 mg/dL — ABNORMAL LOW (ref 8.9–10.3)
Creatinine, Ser: 1.12 mg/dL — ABNORMAL HIGH (ref 0.44–1.00)
GFR calc Af Amer: >60 mL/min (ref >60)
GLUCOSE: 137 mg/dL — AB (ref 65–99)
POTASSIUM: 4.5 mmol/L (ref 3.5–5.1)
Sodium: 137 mmol/L (ref 135–145)

## 2017-10-28 LAB — CBC
HCT: 34.7 % — ABNORMAL LOW (ref 36.0–46.0)
HEMOGLOBIN: 10.9 g/dL — AB (ref 12.0–15.0)
MCH: 27.2 pg (ref 26.0–34.0)
MCHC: 31.4 g/dL (ref 30.0–36.0)
MCV: 86.5 fL (ref 78.0–100.0)
PLATELETS: 357 10*3/uL (ref 150–400)
RBC: 4.01 MIL/uL (ref 3.87–5.11)
RDW: 14.8 % (ref 11.5–15.5)
WBC: 9.7 10*3/uL (ref 4.0–10.5)

## 2017-10-28 NOTE — Progress Notes (Signed)
Patient ambulated on hall way this morning, tolerated well.  °

## 2017-10-28 NOTE — Progress Notes (Signed)
   10/28/17 1400  Clinical Encounter Type  Visited With Patient  Visit Type Initial  Referral From Nurse  Consult/Referral To Chaplain  Spiritual Encounters  Spiritual Needs Prayer  Stress Factors  Patient Stress Factors Health changes   Responded to a SCC for HCPA.  Patient did want the information and I reviewed the document with her.  She wants to talk to her husband, but plans on completing before leaving the hospital.  We talked about her kids and spouse and she shared she had been scared before the procedure, but was better now.  We prayed together.  Will follow as needed. Chaplain Agustin CreeNewton Danh Bayus

## 2017-10-28 NOTE — Plan of Care (Signed)
  Progressing Coping: Level of anxiety will decrease 10/28/2017 1601 - Progressing by Saunders Glance, RN   Completed/Met Activity: Risk for activity intolerance will decrease 10/28/2017 1601 - Completed/Met by Saunders Glance, RN

## 2017-10-28 NOTE — Care Management Note (Signed)
Case Management Note  Patient Details  Name: Teresa Osborne MRN: [id number] Date of Birth: November 06, 1976  Subjective/Objective:  41 y/o f admitted w/L renal stone. S/p cystoscopy. From home.                  Action/Plan:d/c plan home.   Expected Discharge Date:                  Expected Discharge Plan:  Home/Self Care  In-House Referral:     Discharge planning Services  CM Consult  Post Acute Care Choice:    Choice offered to:     DME Arranged:    DME Agency:     HH Arranged:    HH Agency:     Status of Service:  In process, will continue to follow  If discussed at Long Length of Stay Meetings, dates discussed:    Additional Comments:  Lanier ClamMahabir, Jeancarlos Marchena, RN 10/28/2017, 3:14 PM

## 2017-10-28 NOTE — Progress Notes (Signed)
1 Day Post-Op Subjective: Patient reports resolution of left flank pain. Afebrile overnight. WBC count normal. Cultures pending  Objective: Vital signs in last 24 hours: Temp:  [97.3 F (36.3 C)-98.8 F (37.1 C)] 97.3 F (36.3 C) (11/28 1955) Pulse Rate:  [61-92] 92 (11/28 1955) Resp:  [18-20] 20 (11/28 1955) BP: (126-161)/(60-82) 161/72 (11/28 1955) SpO2:  [94 %-98 %] 98 % (11/28 1955)  Intake/Output from previous day: 11/27 0701 - 11/28 0700 In: 3186.3 [P.O.:240; I.V.:2796.3; IV Piggyback:150] Out: 1400 [Urine:1400] Intake/Output this shift: No intake/output data recorded.  Physical Exam:  General:alert, cooperative and appears stated age GI: soft, non tender, normal bowel sounds, no palpable masses, no organomegaly, no inguinal hernia Female genitalia: not done Extremities: extremities normal, atraumatic, no cyanosis or edema  Lab Results: Recent Labs    10/27/17 1528 10/28/17 0500  HGB 11.3* 10.9*  HCT 35.7* 34.7*   BMET Recent Labs    10/27/17 1528 10/28/17 0500  NA 134* 137  K 4.0 4.5  CL 102 107  CO2 25 26  GLUCOSE 128* 137*  BUN 9 9  CREATININE 1.09* 1.12*  CALCIUM 8.7* 8.8*   No results for input(s): LABPT, INR in the last 72 hours. No results for input(s): LABURIN in the last 72 hours. Results for orders placed or performed during the hospital encounter of 10/27/17  Aerobic/Anaerobic Culture (surgical/deep wound)     Status: None (Preliminary result)   Collection Time: 10/27/17  2:01 PM  Result Value Ref Range Status   Specimen Description FLUID LEFT RENAL PELVIS  Final   Special Requests NONE  Final   Gram Stain   Final    FEW WBC PRESENT, PREDOMINANTLY PMN NO ORGANISMS SEEN    Culture   Final    MODERATE PROTEUS MIRABILIS SUSCEPTIBILITIES TO FOLLOW NO ANAEROBES ISOLATED; CULTURE IN PROGRESS FOR 5 DAYS RESULT CALLED TO, READ BACK BY AND VERIFIED WITH: RN Duffy BruceK GURLEY [phone] MLM Performed at [facility], [address]  [place], [place] [address]    Report Status PENDING  Incomplete    Studies/Results: Dg C-arm 1-60 Min-no Report  Result Date: 10/27/2017 Fluoroscopy was utilized by the requesting physician.  No radiographic interpretation.    Assessment/Plan: POD#1 L nephrostomy tube placement  1. Continue zosyn 2. Discontinue foley catheter 3. Likely discharge tomorrow if patient remains afebrile   LOS: 1 day   Wilkie Ayeatrick Caylen Kuwahara 10/28/2017, 11:14 PM

## 2017-10-29 ENCOUNTER — Encounter (HOSPITAL_COMMUNITY): Payer: Self-pay

## 2017-10-29 ENCOUNTER — Ambulatory Visit (HOSPITAL_COMMUNITY)
Admission: RE | Admit: 2017-10-29 | Discharge: 2017-10-29 | Disposition: A | Discharge status: Home / Self Care | Payer: 59 | Source: Ambulatory Visit | Attending: Urology | Admitting: Urology

## 2017-10-29 MED ORDER — OXYCODONE-ACETAMINOPHEN 5-325 MG PO TABS: 1.0000 | ORAL_TABLET | ORAL | 0 refills | Status: DC | PRN

## 2017-10-29 MED ORDER — SULFAMETHOXAZOLE-TRIMETHOPRIM 800-160 MG PO TABS: 1.0000 | ORAL_TABLET | Freq: Two times a day (BID) | ORAL | 0 refills | Status: DC

## 2017-10-29 NOTE — Discharge Instructions (Signed)
Percutaneous Nephrostomy Home Guide Percutaneous nephrostomy is a procedure to insert a flexible tube into your kidney so that urine can leave your body. This procedure may be done if a medical condition prevents urine from leaving your kidney in the usual way. During the procedure, the nephrostomy tube is inserted in the right or left side of your lower back and is connected to an external drainage bag. After you have a nephrostomy tube placed, urine will collect in the drainage bag outside of your body. You will need to empty and change the drainage bag as needed. You will also need to take steps to care for the area where the nephrostomy tube was inserted (tube insertion site). How do I care for my nephrostomy tube?  Always keep your tubing, the leg bag, or the bedside drainage bag below the level of your kidney so that your urine drains freely.  Avoid activities that would cause bending or pulling of your tubing. Ask your health care provider what activities are safe for you.  When connecting your nephrostomy tube to a drainage bag, make sure that there are no kinks in the tubing and that your urine is draining freely. You may want to gently wrap an elastic bandage over the tubing. This will help keep the tubing in place and prevent it from kinking. Make sure there is no tension on the tubing so it does not become dislodged.  At night, you may want to connect your nephrostomy tube or the leg bag to a larger bedside drainage bag. How do I empty the drainage bag? Empty the leg bag or bedside drainage bag whenever it becomes ? full. Also empty it before you go to sleep. Most drainage bags have a drain at the bottom that allows urine to be emptied. Follow these basic steps: 1. Hold the drainage bag over a toilet or collection container. Use a measuring container if your health care provider told you to measure your urine. 2. Open the drain of the bag and allow the urine to drain out. 3. After all the  urine has drained from the drainage bag, close the drain fully. 4. Flush the urine down the toilet. If a collection container was used, rinse the container.  How do I change the dressing around the nephrostomy tube? Change your dressing and clean your tube exit site as told by your health care provider. You may need to change the dressing every day for the first 2 weeks after having a nephrostomy tube inserted. After the first 2 weeks, you may be told to change the dressing two times a week. Supplies needed:  Mild soap and water.  Split gauze pads, 4  4 inches (10 x 10 cm).  Gauze pads, 4  4 inches (10 x 10 cm).  Paper tape. How to change the dressing: Because of the location of your nephrostomy tube, you may need help from another person to complete dressing changes. Follow these basic steps: 1. Wash hands with soap and water. 2. Gently remove the tape and dressing from around the nephrostomy tube. Be careful not to pull on the tube while removing the dressing. Avoid using scissors because they may damage the tube. 3. Wash the skin around the tube with mild soap and water, rinse well, and pat the skin dry with a clean cloth. 4. Check the skin around the drain for redness, swelling, pus, warmth, or a bad smell. 5. If the drain was sutured to the skin, check the suture to  verify that it is still anchored in the skin. 6. Place two split gauze pads in and around the tube exit site. Do not apply ointments or alcohol to the site. 7. Place a gauze pad on top of the split gauze pad. 8. Coil the tube on top of the gauze. The tubing should rest on the gauze, not on the skin. 9. Place tape around each edge of the gauze pad. 10. Secure the nephrostomy tubing. Make sure that the tube does not kink or become pinched. The tubing should rest on the gauze pad, not on the skin. 11. Dispose of used supplies properly.  How do I flush my nephrostomy tube? Use a saline syringe to rinse out (flush) your  nephrostomy tube as told by your health care provider. Flushing is easier if a three-way stopcock is placed between the tube and the drainage bag. One connection of the stopcock connects to your tube, the second connects to the drainage bag, and the third is usually covered with a cap. The lever on the stopcock points to the direction on the stopcock that is closed to flow. Normally, the lever points in the direction of the cap to allow urine to drain from the tube to the drainage bag. Supplies needed:  Rubbing alcohol wipe.  10 mL 0.9% saline syringe. How to flush the tube: 1. Move the lever of the three-way stopcock so it points toward the drainage bag. 2. Clean the cap with a rubbing alcohol wipe. 3. Screw the tip of a 10 mL 0.9% saline syringe onto the cap. 4. Using the syringe plunger, slowly push the 10 mL 0.9% saline in the syringe over 5-10 seconds. If resistance is met or pain occurs while pushing, stop pushing the saline. 5. Remove the syringe from the cap. 6. Return the stopcock lever to the usual position, pointing in the direction of the cap. 7. Dispose of used supplies properly. How do I replace the drainage bag? Replace the drainage bag, three-way stopcock, and any extension tubing as told by your health care provider. Make sure you always have an extra drainage bag and connecting tubing available. 1. Empty urine from your drainage bag. 2. Gather a new drainage bag, three-way stopcock, and any extension tubing. 3. Remove the drainage bag, three-way stopcock, and any extension tubing from the nephrostomy tube. 4. Attach the new leg bag or bedside drainage bag, three-way stopcock, and any extension tubing to the nephrostomy tube. 5. Dispose of the used drainage bag, three-way stopcock, and any extension.  Contact a health care provider if:  You have problems with any of the valves or tubing.  You have persistent pain or soreness in your back.  You have more redness, swelling,  or pain around your tube insertion site.  You have more fluid or blood coming from your tube insertion site.  Your tube insertion site feels warm to the touch.  You have pus or a bad smell coming from your tube insertion site.  You have increased urine output or you feel burning when urinating. Get help right away if:  You have pain in your abdomen during the first week.  You have chest pain or have trouble breathing.  You have a new appearance of blood in your urine.  You have a fever or chills.  You have back pain that is not relieved by your medicine.  You have decreased urine output.  Your nephrostomy tube comes out. This information is not intended to replace advice given to  you by your health care provider. Make sure you discuss any questions you have with your health care provider. Document Released: 09/07/2013 Document Revised: 08/29/2016 Document Reviewed: 08/29/2016 Elsevier Interactive Patient Education  [employer].

## 2017-10-29 NOTE — Progress Notes (Signed)
Patient and family given discharge, follow up, medication, and nephrostomy tube care instructions with teach back, dressing changed, IV and telemetry monitor removed, family to transport home

## 2017-11-01 LAB — AEROBIC/ANAEROBIC CULTURE W GRAM STAIN (SURGICAL/DEEP WOUND)

## 2017-11-01 LAB — AEROBIC/ANAEROBIC CULTURE (SURGICAL/DEEP WOUND)

## 2017-11-10 ENCOUNTER — Other Ambulatory Visit: Payer: Self-pay | Admitting: Urology

## 2017-11-18 NOTE — Discharge Summary (Signed)
Physician Discharge Summary  Patient ID: Teresa Osborne MRN: [id number] DOB/AGE: 41/12/1975 41 y.o.  Admit date: 10/27/2017 Discharge date: 10/29/2017  Admission Diagnoses: Renal calculus, pyonephrosis Discharge Diagnoses:  Active Problems:   Renal calculus   Discharged Condition: good  Hospital Course: The patient tolerated the procedure well and was transferred to the floor on IV pain meds, IV fluid. On POD#1 foley was removed, pt was started on regular diet and they ambulated in the halls. Her urine culture grew Proteus. She remained afebrile during her hospital admission. Prior to discharge the pt was tolerating a regular diet, pain was controlled on PO pain meds, they were ambulating without difficulty, and they had normal bowel function.   Consults: None  Significant Diagnostic Studies: labs: urine culture  Treatments: surgery: nephrostomy tube placement  Discharge Exam: Blood pressure 130/70, pulse 75, temperature 98.9 F (37.2 C), temperature source Oral, resp. rate 20, height 5\' 6"  (1.676 m), weight (!) 184.3 kg (406 lb 3.2 oz), SpO2 98 %. General appearance: alert, cooperative and appears stated age Head: Normocephalic, without obvious abnormality, atraumatic Nose: Nares normal. Septum midline. Mucosa normal. No drainage or sinus tenderness. Neck: no adenopathy, no carotid bruit, no JVD, supple, symmetrical, trachea midline and thyroid not enlarged, symmetric, no tenderness/mass/nodules Resp: clear to auscultation bilaterally Cardio: regular rate and rhythm, S1, S2 normal, no murmur, click, rub or gallop GI: soft, non-tender; bowel sounds normal; no masses,  no organomegaly Extremities: extremities normal, atraumatic, no cyanosis or edema Skin: Skin color, texture, turgor normal. No rashes or lesions Neurologic: Grossly normal  Disposition: 06-Home-Health Care Svc  Discharge Instructions    Discharge patient   Complete by:  As directed    Discharge disposition:   01-Home or Self Care   Discharge patient date:  10/29/2017     Allergies as of 10/29/2017   Not on File     Medication List    TAKE these medications   levonorgestrel 20 MCG/24HR IUD Commonly known as:  MIRENA 1 each once by Intrauterine route.   Naltrexone-Bupropion HCl ER 8-90 MG Tb12 1 tab in the morning 7 days, then 1 tab BID 7 days, then 2 tabs in the morning and 1 tab afternoon 7 days, then 2 tabs BID.   naproxen 500 MG tablet Commonly known as:  NAPROSYN Take 500 mg 2 (two) times daily as needed by mouth. For kidney stone pain.   oxyCODONE-acetaminophen 5-325 MG tablet Commonly known as:  PERCOCET/ROXICET Take 1-2 tablets by mouth every 4 (four) hours as needed for moderate pain.   sulfamethoxazole-trimethoprim 800-160 MG tablet Commonly known as:  BACTRIM DS,SEPTRA DS Take 1 tablet by mouth 2 (two) times daily.      Follow-up Information    Burman FosterFleck, Bree Tharpe, NP. Call in 2 week(s).   Specialty:  Urology Contact information: [address] [place] [address] [phone]           Signed: Wilkie Ayeatrick Oval Moralez 11/18/2017, 1:27 PM

## 2017-12-02 NOTE — Patient Instructions (Addendum)
Your procedure is scheduled on: Monday, Jan. 7, 2018   Surgery Time:  12:45PM-2:45PM   Report to [facility] Main  Entrance    Report to admitting at 10:45AM    Call this number if you have problems the morning of surgery [phone]    Do not eat food or drink liquids :After Midnight.   Do NOT smoke after Midnight   Take these medicines the morning of surgery with A SIP OF WATER: None                               You may not have any metal on your body including hair pins, jewelry, and body piercings             Do not wear make-up, lotions, powders, perfumes,or deodorant             Do not wear nail polish.  Do not shave  48 hours prior to surgery.               Do not bring valuables to the hospital. [facility] IS NOT             RESPONSIBLE   FOR VALUABLES.   Contacts, dentures or bridgework may not be worn into surgery.   Leave suitcase in the car. After surgery it may be brought to your room.  Special Instructions: Bring a copy of your healthcare power of attorney and living will documents         the day of surgery if you haven't scanned them in before.              Please read over the following fact sheets you were given:  [facility] - Preparing [facility] Before surgery, you can play an important role.  Because skin is not sterile, your skin needs to be as free of germs as possible.  You can reduce the number of germs on your skin by washing with CHG (chlorahexidine gluconate) soap before surgery.  CHG is an antiseptic cleaner which kills germs and bonds with the skin to continue killing germs even after washing. Please DO NOT use if you have an allergy to CHG or antibacterial soaps.  If your skin becomes reddened/irritated stop using the CHG and inform your nurse when you arrive at Short Stay. Do not shave (including legs and underarms) for at least 48 hours prior to the first CHG shower.  You may shave your face/neck.  Please follow these  instructions carefully:  1.  Shower with CHG Soap the night before surgery and the  morning of surgery.  2.  If you choose to wash your hair, wash your hair first as usual with your normal  shampoo.  3.  After you shampoo, rinse your hair and body thoroughly to remove the shampoo.                             4.  Use CHG as you would any other liquid soap.  You can apply chg directly to the skin and wash.  Gently with a scrungie or clean washcloth.  5.  Apply the CHG Soap to your body ONLY FROM THE NECK DOWN.   Do   not use on face/ open  Wound or open sores. Avoid contact with eyes, ears mouth and   genitals (private parts).                       Wash face,  Genitals (private parts) with your normal soap.             6.  Wash thoroughly, paying special attention to the area where your    surgery  will be performed.  7.  Thoroughly rinse your body with warm water from the neck down.  8.  DO NOT shower/wash with your normal soap after using and rinsing off the CHG Soap.                9.  Pat yourself dry with a clean towel.            10.  Wear clean pajamas.            11.  Place clean sheets on your bed the night of your first shower and do not  sleep with pets. Day of Surgery : Do not apply any lotions/deodorants the morning of surgery.  Please wear clean clothes to the hospital/surgery center.  FAILURE TO FOLLOW THESE INSTRUCTIONS MAY RESULT IN THE CANCELLATION OF YOUR SURGERY  PATIENT SIGNATURE_________________________________  NURSE SIGNATURE__________________________________  ________________________________________________________________________

## 2017-12-02 NOTE — Pre-Procedure Instructions (Signed)
The following are in epic: Hgb A1C (6.0) 09/14/17

## 2017-12-03 ENCOUNTER — Encounter (HOSPITAL_COMMUNITY)
Admission: RE | Admit: 2017-12-03 | Discharge: 2017-12-03 | Disposition: A | Discharge status: Home / Self Care | Payer: Managed Care, Other (non HMO) | Source: Ambulatory Visit | Attending: Urology | Admitting: Urology

## 2017-12-03 ENCOUNTER — Encounter (HOSPITAL_COMMUNITY): Payer: Self-pay

## 2017-12-03 ENCOUNTER — Other Ambulatory Visit: Payer: Self-pay

## 2017-12-03 ENCOUNTER — Encounter (INDEPENDENT_AMBULATORY_CARE_PROVIDER_SITE_OTHER): Payer: Self-pay

## 2017-12-03 DIAGNOSIS — N200 Calculus of kidney: Secondary | ICD-10-CM | POA: Insufficient documentation

## 2017-12-03 DIAGNOSIS — Z01812 Encounter for preprocedural laboratory examination: Secondary | ICD-10-CM | POA: Insufficient documentation

## 2017-12-03 HISTORY — DX: Other specified postprocedural states: R11.2

## 2017-12-03 HISTORY — DX: Personal history of urinary calculi: Z87.442

## 2017-12-03 HISTORY — DX: Other specified postprocedural states: Z98.890

## 2017-12-03 LAB — CBC
HEMATOCRIT: 34.7 % — AB (ref 36.0–46.0)
HEMOGLOBIN: 11.2 g/dL — AB (ref 12.0–15.0)
MCH: 27.1 pg (ref 26.0–34.0)
MCHC: 32.3 g/dL (ref 30.0–36.0)
MCV: 84 fL (ref 78.0–100.0)
Platelets: 426 10*3/uL — ABNORMAL HIGH (ref 150–400)
RBC: 4.13 MIL/uL (ref 3.87–5.11)
RDW: 15 % (ref 11.5–15.5)
WBC: 6.9 10*3/uL (ref 4.0–10.5)

## 2017-12-03 LAB — HEMOGLOBIN A1C
HEMOGLOBIN A1C: 5.1 % (ref 4.8–5.6)
Mean Plasma Glucose: 99.67 mg/dL

## 2017-12-03 LAB — HCG, SERUM, QUALITATIVE: Preg, Serum: NEGATIVE

## 2017-12-03 NOTE — Progress Notes (Signed)
Bari bed requested with portable equipment. 

## 2017-12-07 ENCOUNTER — Ambulatory Visit (HOSPITAL_COMMUNITY): Payer: Managed Care, Other (non HMO)

## 2017-12-07 ENCOUNTER — Encounter (HOSPITAL_COMMUNITY): Payer: Self-pay | Admitting: Anesthesiology

## 2017-12-07 ENCOUNTER — Other Ambulatory Visit: Payer: Self-pay

## 2017-12-07 ENCOUNTER — Encounter (HOSPITAL_COMMUNITY)
Admission: RE | Disposition: A | Discharge status: Home / Self Care | Payer: Self-pay | Source: Ambulatory Visit | Attending: Urology

## 2017-12-07 ENCOUNTER — Observation Stay (HOSPITAL_COMMUNITY)
Admission: RE | Admit: 2017-12-07 | Discharge: 2017-12-08 | Disposition: A | Discharge status: Home / Self Care | Payer: Managed Care, Other (non HMO) | Source: Ambulatory Visit | Attending: Urology | Admitting: Urology

## 2017-12-07 ENCOUNTER — Ambulatory Visit (HOSPITAL_COMMUNITY): Payer: Managed Care, Other (non HMO) | Admitting: Anesthesiology

## 2017-12-07 DIAGNOSIS — R7303 Prediabetes: Secondary | ICD-10-CM | POA: Insufficient documentation

## 2017-12-07 DIAGNOSIS — Z87442 Personal history of urinary calculi: Secondary | ICD-10-CM

## 2017-12-07 DIAGNOSIS — N200 Calculus of kidney: Secondary | ICD-10-CM | POA: Diagnosis not present

## 2017-12-07 DIAGNOSIS — F419 Anxiety disorder, unspecified: Secondary | ICD-10-CM | POA: Insufficient documentation

## 2017-12-07 DIAGNOSIS — Z8249 Family history of ischemic heart disease and other diseases of the circulatory system: Secondary | ICD-10-CM | POA: Diagnosis not present

## 2017-12-07 HISTORY — PX: NEPHROLITHOTOMY: SHX5134

## 2017-12-07 LAB — BASIC METABOLIC PANEL
ANION GAP: 4 — AB (ref 5–15)
BUN: 10 mg/dL (ref 6–20)
CHLORIDE: 107 mmol/L (ref 101–111)
CO2: 26 mmol/L (ref 22–32)
CREATININE: 1.21 mg/dL — AB (ref 0.44–1.00)
Calcium: 8.8 mg/dL — ABNORMAL LOW (ref 8.9–10.3)
GFR calc non Af Amer: 55 mL/min — ABNORMAL LOW (ref >60)
Glucose, Bld: 120 mg/dL — ABNORMAL HIGH (ref 65–99)
POTASSIUM: 4.7 mmol/L (ref 3.5–5.1)
SODIUM: 137 mmol/L (ref 135–145)

## 2017-12-07 LAB — CBC
HEMATOCRIT: 33.2 % — AB (ref 36.0–46.0)
HEMOGLOBIN: 10.7 g/dL — AB (ref 12.0–15.0)
MCH: 27.2 pg (ref 26.0–34.0)
MCHC: 32.2 g/dL (ref 30.0–36.0)
MCV: 84.3 fL (ref 78.0–100.0)
Platelets: 358 10*3/uL (ref 150–400)
RBC: 3.94 MIL/uL (ref 3.87–5.11)
RDW: 14.8 % (ref 11.5–15.5)
WBC: 7.2 10*3/uL (ref 4.0–10.5)

## 2017-12-07 SURGERY — NEPHROLITHOTOMY PERCUTANEOUS
Anesthesia: General | Laterality: Left

## 2017-12-07 MED ORDER — ROCURONIUM BROMIDE 10 MG/ML (PF) SYRINGE
PREFILLED_SYRINGE | INTRAVENOUS | Status: DC | PRN
  Administered 2017-12-07: 50 mg via INTRAVENOUS
  Administered 2017-12-07: 15 mg via INTRAVENOUS
  Administered 2017-12-07: 20 mg via INTRAVENOUS

## 2017-12-07 MED ORDER — HYDROCODONE-ACETAMINOPHEN 7.5-325 MG PO TABS: 1.0000 | ORAL_TABLET | Freq: Once | ORAL | Status: DC | PRN

## 2017-12-07 MED ORDER — PIPERACILLIN-TAZOBACTAM 3.375 G IVPB
3.3750 g | Freq: Three times a day (TID) | INTRAVENOUS | Status: DC
  Administered 2017-12-07 – 2017-12-08 (×2): 3.375 g via INTRAVENOUS
  Filled 2017-12-07 (×2): qty 50

## 2017-12-07 MED ORDER — PROPOFOL 10 MG/ML IV BOLUS
INTRAVENOUS | Status: AC
  Filled 2017-12-07: qty 20

## 2017-12-07 MED ORDER — SUCCINYLCHOLINE CHLORIDE 200 MG/10ML IV SOSY
PREFILLED_SYRINGE | INTRAVENOUS | Status: DC | PRN
  Administered 2017-12-07: 200 mg via INTRAVENOUS

## 2017-12-07 MED ORDER — SUGAMMADEX SODIUM 500 MG/5ML IV SOLN
INTRAVENOUS | Status: AC
  Filled 2017-12-07: qty 5

## 2017-12-07 MED ORDER — FENTANYL CITRATE (PF) 100 MCG/2ML IJ SOLN
INTRAMUSCULAR | Status: AC
Start: 2017-12-07 — End: ?
  Filled 2017-12-07: qty 2

## 2017-12-07 MED ORDER — FENTANYL CITRATE (PF) 100 MCG/2ML IJ SOLN
INTRAMUSCULAR | Status: AC
  Filled 2017-12-07: qty 2

## 2017-12-07 MED ORDER — SODIUM CHLORIDE 0.9 % IV SOLN
INTRAVENOUS | Status: DC
  Administered 2017-12-07 – 2017-12-08 (×2): via INTRAVENOUS

## 2017-12-07 MED ORDER — SODIUM CHLORIDE 0.9 % IR SOLN
Status: DC | PRN
  Administered 2017-12-07: 1000 mL via INTRAVESICAL

## 2017-12-07 MED ORDER — DEXTROSE 5 % IV SOLN
INTRAVENOUS | Status: DC | PRN
  Administered 2017-12-07: 2 g via INTRAVENOUS

## 2017-12-07 MED ORDER — DEXTROSE 5 % IV SOLN
2.0000 g | INTRAVENOUS | Status: DC
  Filled 2017-12-07: qty 2

## 2017-12-07 MED ORDER — DIPHENHYDRAMINE HCL 50 MG/ML IJ SOLN: 12.5000 mg | Freq: Four times a day (QID) | INTRAMUSCULAR | Status: DC | PRN

## 2017-12-07 MED ORDER — FENTANYL CITRATE (PF) 100 MCG/2ML IJ SOLN
INTRAMUSCULAR | Status: DC | PRN
  Administered 2017-12-07: 50 ug via INTRAVENOUS
  Administered 2017-12-07: 100 ug via INTRAVENOUS
  Administered 2017-12-07: 50 ug via INTRAVENOUS

## 2017-12-07 MED ORDER — HYOSCYAMINE SULFATE 0.125 MG SL SUBL
0.1250 mg | SUBLINGUAL_TABLET | SUBLINGUAL | Status: DC | PRN
  Filled 2017-12-07: qty 1

## 2017-12-07 MED ORDER — LIDOCAINE 2% (20 MG/ML) 5 ML SYRINGE
INTRAMUSCULAR | Status: DC | PRN
  Administered 2017-12-07: 100 mg via INTRAVENOUS

## 2017-12-07 MED ORDER — HYDROMORPHONE HCL 1 MG/ML IJ SOLN
0.5000 mg | INTRAMUSCULAR | Status: DC | PRN
  Administered 2017-12-07 – 2017-12-08 (×3): 1 mg via INTRAVENOUS
  Filled 2017-12-07 (×3): qty 1

## 2017-12-07 MED ORDER — DIPHENHYDRAMINE HCL 12.5 MG/5ML PO ELIX: 12.5000 mg | ORAL_SOLUTION | Freq: Four times a day (QID) | ORAL | Status: DC | PRN

## 2017-12-07 MED ORDER — HYDROMORPHONE HCL 1 MG/ML IJ SOLN
0.2500 mg | INTRAMUSCULAR | Status: DC | PRN
  Administered 2017-12-07 (×3): 0.5 mg via INTRAVENOUS

## 2017-12-07 MED ORDER — ONDANSETRON HCL 4 MG/2ML IJ SOLN
INTRAMUSCULAR | Status: DC | PRN
  Administered 2017-12-07 (×2): 4 mg via INTRAVENOUS

## 2017-12-07 MED ORDER — ROCURONIUM BROMIDE 50 MG/5ML IV SOSY
PREFILLED_SYRINGE | INTRAVENOUS | Status: AC
  Filled 2017-12-07: qty 5

## 2017-12-07 MED ORDER — MIDAZOLAM HCL 2 MG/2ML IJ SOLN
INTRAMUSCULAR | Status: AC
  Filled 2017-12-07: qty 2

## 2017-12-07 MED ORDER — LACTATED RINGERS IV SOLN
INTRAVENOUS | Status: DC
  Administered 2017-12-07 (×2): via INTRAVENOUS

## 2017-12-07 MED ORDER — MEPERIDINE HCL 50 MG/ML IJ SOLN: 6.2500 mg | INTRAMUSCULAR | Status: DC | PRN

## 2017-12-07 MED ORDER — HYDROMORPHONE HCL 1 MG/ML IJ SOLN
INTRAMUSCULAR | Status: AC
  Administered 2017-12-07: 0.5 mg via INTRAVENOUS
  Filled 2017-12-07: qty 2

## 2017-12-07 MED ORDER — PROMETHAZINE HCL 25 MG/ML IJ SOLN
INTRAMUSCULAR | Status: AC
  Filled 2017-12-07: qty 1

## 2017-12-07 MED ORDER — DEXAMETHASONE SODIUM PHOSPHATE 10 MG/ML IJ SOLN
INTRAMUSCULAR | Status: DC | PRN
  Administered 2017-12-07: 10 mg via INTRAVENOUS

## 2017-12-07 MED ORDER — ONDANSETRON HCL 4 MG/2ML IJ SOLN
INTRAMUSCULAR | Status: AC
  Filled 2017-12-07: qty 2

## 2017-12-07 MED ORDER — SUGAMMADEX SODIUM 500 MG/5ML IV SOLN
INTRAVENOUS | Status: DC | PRN
  Administered 2017-12-07: 500 mg via INTRAVENOUS

## 2017-12-07 MED ORDER — IOHEXOL 300 MG/ML  SOLN
INTRAMUSCULAR | Status: DC | PRN
  Administered 2017-12-07: 90 mL

## 2017-12-07 MED ORDER — PIPERACILLIN-TAZOBACTAM 3.375 G IVPB 30 MIN
3.3750 g | Freq: Four times a day (QID) | INTRAVENOUS | Status: DC
  Administered 2017-12-07: 3.375 g via INTRAVENOUS

## 2017-12-07 MED ORDER — PIPERACILLIN-TAZOBACTAM 3.375 G IVPB
INTRAVENOUS | Status: AC
  Filled 2017-12-07: qty 50

## 2017-12-07 MED ORDER — PROPOFOL 10 MG/ML IV BOLUS
INTRAVENOUS | Status: DC | PRN
  Administered 2017-12-07: 260 mg via INTRAVENOUS

## 2017-12-07 MED ORDER — SCOPOLAMINE 1 MG/3DAYS TD PT72
MEDICATED_PATCH | TRANSDERMAL | Status: DC | PRN
  Administered 2017-12-07: 1 via TRANSDERMAL

## 2017-12-07 MED ORDER — PROMETHAZINE HCL 25 MG/ML IJ SOLN
6.2500 mg | INTRAMUSCULAR | Status: DC | PRN
Start: 2017-12-07 — End: 2017-12-07
  Administered 2017-12-07: 6.25 mg via INTRAVENOUS

## 2017-12-07 MED ORDER — SODIUM CHLORIDE 0.9 % IR SOLN
Status: DC | PRN
  Administered 2017-12-07 (×2): 3000 mL via INTRAVESICAL

## 2017-12-07 MED ORDER — ZOLPIDEM TARTRATE 5 MG PO TABS
5.0000 mg | ORAL_TABLET | Freq: Every evening | ORAL | Status: DC | PRN
Start: 2017-12-07 — End: 2017-12-08

## 2017-12-07 MED ORDER — MIDAZOLAM HCL 5 MG/5ML IJ SOLN
INTRAMUSCULAR | Status: DC | PRN
  Administered 2017-12-07: 2 mg via INTRAVENOUS

## 2017-12-07 MED ORDER — OXYCODONE-ACETAMINOPHEN 5-325 MG PO TABS: 1.0000 | ORAL_TABLET | ORAL | Status: DC | PRN

## 2017-12-07 MED ORDER — SUCCINYLCHOLINE CHLORIDE 200 MG/10ML IV SOSY
PREFILLED_SYRINGE | INTRAVENOUS | Status: AC
  Filled 2017-12-07: qty 10

## 2017-12-07 MED ORDER — ACETAMINOPHEN 10 MG/ML IV SOLN: 1000.0000 mg | Freq: Once | INTRAVENOUS | Status: DC | PRN

## 2017-12-07 MED ORDER — ONDANSETRON HCL 4 MG/2ML IJ SOLN: 4.0000 mg | INTRAMUSCULAR | Status: DC | PRN

## 2017-12-07 MED ORDER — PIPERACILLIN-TAZOBACTAM 3.375 G IVPB: 3.3750 g | Freq: Four times a day (QID) | INTRAVENOUS | Status: DC

## 2017-12-07 SURGICAL SUPPLY — 64 items
APL SKNCLS STERI-STRIP NONHPOA (GAUZE/BANDAGES/DRESSINGS) ×1
BAG URINE DRAINAGE (UROLOGICAL SUPPLIES) ×3 IMPLANT
BASKET STONE NITINOL 3FRX115MB (UROLOGICAL SUPPLIES) IMPLANT
BASKET ZERO TIP NITINOL 2.4FR (BASKET) IMPLANT
BENZOIN TINCTURE PRP APPL 2/3 (GAUZE/BANDAGES/DRESSINGS) ×3 IMPLANT
BLADE SURG 15 STRL LF DISP TIS (BLADE) ×1 IMPLANT
BLADE SURG 15 STRL SS (BLADE) ×2
BSKT STON RTRVL ZERO TP 2.4FR (BASKET)
CATCHER STONE W/TUBE ADAPTER (UROLOGICAL SUPPLIES) IMPLANT
CATH FOLEY 2W COUNCIL 20FR 5CC (CATHETERS) IMPLANT
CATH FOLEY 2W COUNCIL 5CC 18FR (CATHETERS) ×1 IMPLANT
CATH FOLEY 2WAY SLVR  5CC 16FR (CATHETERS)
CATH FOLEY 2WAY SLVR  5CC 18FR (CATHETERS) ×1
CATH FOLEY 2WAY SLVR 5CC 16FR (CATHETERS) ×1 IMPLANT
CATH FOLEY 2WAY SLVR 5CC 18FR (CATHETERS) ×1 IMPLANT
CATH IMAGER II 65CM (CATHETERS) ×2 IMPLANT
CATH INTERMIT  6FR 70CM (CATHETERS) ×2 IMPLANT
CATH ROBINSON RED A/P 20FR (CATHETERS) IMPLANT
CATH URET DUAL LUMEN 6-10FR 50 (CATHETERS) IMPLANT
CATH X-FORCE N30 NEPHROSTOMY (TUBING) ×2 IMPLANT
COVER SURGICAL LIGHT HANDLE (MISCELLANEOUS) ×2 IMPLANT
DRAPE C-ARM 42X120 X-RAY (DRAPES) ×2 IMPLANT
DRAPE LINGEMAN PERC (DRAPES) ×2 IMPLANT
DRAPE SHEET LG 3/4 BI-LAMINATE (DRAPES) ×2 IMPLANT
DRAPE SURG IRRIG POUCH 19X23 (DRAPES) ×2 IMPLANT
DRSG PAD ABDOMINAL 8X10 ST (GAUZE/BANDAGES/DRESSINGS) ×4 IMPLANT
DRSG TEGADERM 8X12 (GAUZE/BANDAGES/DRESSINGS) ×4 IMPLANT
FIBER LASER FLEXIVA 1000 (UROLOGICAL SUPPLIES) IMPLANT
FIBER LASER FLEXIVA 365 (UROLOGICAL SUPPLIES) IMPLANT
FIBER LASER FLEXIVA 550 (UROLOGICAL SUPPLIES) IMPLANT
FIBER LASER TRAC TIP (UROLOGICAL SUPPLIES) IMPLANT
GAUZE SPONGE 4X4 12PLY STRL (GAUZE/BANDAGES/DRESSINGS) ×2 IMPLANT
GLOVE BIO SURGEON STRL SZ8 (GLOVE) ×6 IMPLANT
GLOVE BIOGEL PI IND STRL 8 (GLOVE) IMPLANT
GLOVE BIOGEL PI INDICATOR 8 (GLOVE)
GOWN STRL REUS W/TWL LRG LVL3 (GOWN DISPOSABLE) ×4 IMPLANT
GOWN STRL REUS W/TWL XL LVL3 (GOWN DISPOSABLE) ×2 IMPLANT
GUIDEWIRE AMPLAZ .035X145 (WIRE) ×2 IMPLANT
GUIDEWIRE STR DUAL SENSOR (WIRE) ×4 IMPLANT
IV SET EXTENSION CATH 6 NF (IV SETS) ×2 IMPLANT
KIT BASIN OR (CUSTOM PROCEDURE TRAY) ×2 IMPLANT
MANIFOLD NEPTUNE II (INSTRUMENTS) ×2 IMPLANT
NDL TROCAR 18X15 ECHO (NEEDLE) IMPLANT
NEEDLE TROCAR 18X15 ECHO (NEEDLE) IMPLANT
NEEDLE TROCAR 18X20 (NEEDLE) IMPLANT
PACK CYSTO (CUSTOM PROCEDURE TRAY) ×2 IMPLANT
PAD ABD 8X10 STRL (GAUZE/BANDAGES/DRESSINGS) ×1 IMPLANT
PROBE LITHOCLAST ULTRA 3.8X403 (UROLOGICAL SUPPLIES) ×1 IMPLANT
PROBE PNEUMATIC 1.0MMX570MM (UROLOGICAL SUPPLIES) IMPLANT
SET AMPLATZ RENAL DILATOR (MISCELLANEOUS) IMPLANT
SET IRRIG Y TYPE TUR BLADDER L (SET/KITS/TRAYS/PACK) ×2 IMPLANT
SHEATH COOK PEEL AWAY SET 9F (SHEATH) ×1 IMPLANT
SHEATH PEELAWAY SET 9 (SHEATH) IMPLANT
SPONGE LAP 4X18 X RAY DECT (DISPOSABLE) ×2 IMPLANT
STENT CONTOUR 6FRX26X.038 (STENTS) ×1 IMPLANT
STONE CATCHER W/TUBE ADAPTER (UROLOGICAL SUPPLIES) ×2 IMPLANT
SUT SILK 2 0 30  PSL (SUTURE) ×1
SUT SILK 2 0 30 PSL (SUTURE) ×1 IMPLANT
SYR 10ML LL (SYRINGE) ×2 IMPLANT
SYR 20CC LL (SYRINGE) ×4 IMPLANT
SYR 50ML LL SCALE MARK (SYRINGE) ×2 IMPLANT
TOWEL OR 17X26 10 PK STRL BLUE (TOWEL DISPOSABLE) ×3 IMPLANT
TRAY FOLEY BAG SILVER LF 16FR (CATHETERS) ×2 IMPLANT
TUBING CONNECTING 10 (TUBING) ×3 IMPLANT

## 2017-12-07 NOTE — H&P (Signed)
Urology Admission H&P  Chief Complaint: left flank pain  History of Present Illness: [name] is a 42yo with a hx of proteus UTI and left staghorn calculus who underwent left nephrostomy tube placement. She presents today for definitive therapy. She denies nay flank pain currently   Past Medical History:  Diagnosis Date  . Anxiety   . History of kidney stones   . Kidney stones    Staghorn  . PONV (postoperative nausea and vomiting)    [place]  . Pre-diabetes    Past Surgical History:  Procedure Laterality Date  . CYSTOSCOPY  10/27/2017   Procedure: CYSTOSCOPY FLEXIBLE;  Surgeon: Malen GauzeMcKenzie, Millee Denise L, MD;  Location: [facility];  Service: Urology;;  . NEPHROSTOMY Left 10/27/2017   Procedure: NEPHROSTOMY TUBE PLACEMENT;  Surgeon: Malen GauzeMcKenzie, Lelah Rennaker L, MD;  Location: [facility];  Service: Urology;  Laterality: Left;  . NO PAST SURGERIES      Home Medications:  Current Facility-Administered Medications  Medication Dose Route Frequency Provider Last Rate Last Dose  . lactated ringers infusion   Intravenous Continuous Trevor IhaHouser, Stephen A, MD 50 mL/hr at 12/07/17 1122    . piperacillin-tazobactam (ZOSYN) IVPB 3.375 g  3.375 g Intravenous Q6H Armel Rabbani, Mardene CelestePatrick L, MD       Allergies: No Known Allergies  Family History  Problem Relation Age of Onset  . Cancer Paternal Grandfather        pancreatic  . Alcohol abuse Paternal Grandfather   . Arthritis Paternal Grandfather   . COPD Paternal Grandfather   . Early death Paternal Grandfather   . Cancer Paternal Grandmother        unknown, metz  . Arthritis Paternal Grandmother   . COPD Paternal Grandmother   . Early death Paternal Grandmother   . Arthritis Father   . Mental retardation Father   . Arthritis Maternal Grandmother   . COPD Maternal Grandfather   . Heart disease Maternal Grandfather   . Cancer Sister [id number]       ovarian   Social History:  reports that  has never smoked. she has never used smokeless tobacco. She reports that she  does not drink alcohol or use drugs.  Review of Systems  All other systems reviewed and are negative.   Physical Exam:  Vital signs in last 24 hours: Temp:  [98.4 F (36.9 C)] 98.4 F (36.9 C) (01/07 1059) Pulse Rate:  [114] 114 (01/07 1059) Resp:  [18] 18 (01/07 1059) BP: (156)/(61) 156/61 (01/07 1059) SpO2:  [98 %] 98 % (01/07 1059) Weight:  [182.8 kg (403 lb)] 182.8 kg (403 lb) (01/07 1106) Physical Exam  Constitutional: She is oriented to person, place, and time. She appears well-developed and well-nourished.  HENT:  Head: Normocephalic and atraumatic.  Eyes: EOM are normal.  Neck: Normal range of motion. No thyromegaly present.  Cardiovascular: Normal rate and regular rhythm.  Respiratory: Effort normal. No respiratory distress.  GI: Soft. She exhibits no distension.  Musculoskeletal: Normal range of motion. She exhibits no edema.  Neurological: She is alert and oriented to person, place, and time.  Skin: Skin is warm and dry.  Psychiatric: She has a normal mood and affect. Her behavior is normal. Judgment and thought content normal.    Laboratory Data:  No results found for this or any previous visit (from the past 24 hour(s)). No results found for this or any previous visit (from the past 240 hour(s)). Creatinine: No results for input(s): CREATININE in the last 168 hours. Baseline Creatinine: unknwon  Impression/Assessment:  42yo with left staghorn calculus  Plan:  The risks/benefits/alterantives the L PCNL was explained to the patient and she understands and wishes to proceed with surgery  Wilkie Aye 12/07/2017, 12:52 PM

## 2017-12-07 NOTE — Op Note (Signed)
Preoperative diagnosis: Left renal stone  Postoperative diagnosis: Same  Procedure 1.  Left percutaneous nephrostolithotomy for stone greater than 2 cm 2.  Left nephrostogram 3.  Intraoperative fluoroscopy, under 1 hour, with interpretation 4.  Placement of a 6 x 26 double-J ureteral stent. 5.   Placement of a 4318 French nephrostomy tube 6.  Dilation of percutaneous tract  Attending: Dr. Ronne BinningMckenzie  Anesthesia: General  Estimated blood loss: Minimal  Antibiotics: zosyn  Drains: 1.  16 French Foley catheter 2.  6 x 26 left double-J ureteral stent 3.  18 French nephrostomy tube  Specimens: Stone for analysis  Findings: complete staghorn calculus. Lower pole access.  Greater than 50% stone burden remains  Indications: Patient is a 42 year old female with a history of large left renal stones.  After discussing treatment options and decided she was left percutaneous nephrostolithotomy.  Patient already has a nephrostomy tube.  Procedure in detail: Prior to procedure consent was obtained.  Patient was brought to the operating room debridement was done to ensure correct patient, correct procedure, and correct site.  General anesthesia was administered.  A 16 French Foley catheter was in place.  The patient was then placed in the prone position.  His nephrostomy tube and left flank was then prepped and draped in usual sterile fashion.  A nephrostogram was obtained and findings noted above.  Through the nephrostomy tube we then placed a sensor wire.  Sensor wire was coiled in the renal pelvis we then removed the nephrostomy tube.  We then made an incision at the level of the skin and over the wire we then placed a NephroMax dilator.  We dilated the nephrostomy tract to 30 [place] and held this 18 cm of water for 1 minute.  We then  placed the access sheath over the balloon.  The balloon was then deflated.  We then used a rigid nephroscope to perform nephroscopy.  We encountered stone layering the  netire kidney. Using the lithoclast was sucted the stone and fragmented some pieces with the ultrasound.    Once the stone in the lower pole and renal pelvis were removed we then were able to perform ureteroscopy with a flexible ureteroscope.  We advanced ureteroscope down to the level of bladder.  We then placed a second wire through the ureteroscope into the bladder.  We then removed the ureteroscope and over the wire placed a 6 x 26 double-J ureteral stent.  The wire was then removed and good coil was noted in the renal pelvis under direct vision in the bladder under fluoroscopy.  We then placed a 18 French nephrostomy tube through the sheath into the renal pelvis.  The balloon was inflated with 3 ml of contrast.  We then removed the access sheath in and obtain another nephrostogram.  We noted minimal extravasation of contrast.  We then secured the nephrostomy tubes with 0 silks in interrupted fashion.  Dressing was placed over the nephrostomy tube site and this then concluded the procedure was well-tolerated by the patient.  Complications: None  Condition: Stable, extubated, transferred to PACU  Plan: Patient is to be admitted overnight for observation.  her Foley catheter through the morning.  she is then to be discharged home and followup in 2-3 weeks for repeat PCNL

## 2017-12-07 NOTE — Transfer of Care (Signed)
Immediate Anesthesia Transfer of Care Note  Patient: Teresa Osborne  Procedure(s) Performed: LEFT NEPHROLITHOTOMY PERCUTANEOUS AND STENT PLACEMENT (Left )  Patient Location: PACU  Anesthesia Type:General  Level of Consciousness: drowsy and patient cooperative  Airway & Oxygen Therapy: Patient Spontanous Breathing and Patient connected to face mask  Post-op Assessment: Report given to RN and Post -op Vital signs reviewed and stable  Post vital signs: Reviewed and stable  Last Vitals:  Vitals:   12/07/17 1059  BP: (!) 156/61  Pulse: (!) 114  Resp: 18  Temp: 36.9 C  SpO2: 98%    Last Pain:  Vitals:   12/07/17 1059  TempSrc: Oral         Complications: No apparent anesthesia complications

## 2017-12-07 NOTE — Progress Notes (Signed)
Patient ambulated on hall way at this time, tolerated well. 

## 2017-12-07 NOTE — Anesthesia Preprocedure Evaluation (Signed)
Anesthesia Evaluation  Patient identified by MRN, date of birth, ID band Patient awake    Reviewed: Allergy & Precautions, NPO status , Patient's Chart, lab work & pertinent test results  History of Anesthesia Complications (+) PONV  Airway Mallampati: I  TM Distance: >3 FB Neck ROM: Full    Dental  (+) Teeth Intact   Pulmonary neg pulmonary ROS,    breath sounds clear to auscultation       Cardiovascular negative cardio ROS   Rhythm:Regular Rate:Normal     Neuro/Psych Anxiety negative neurological ROS  negative psych ROS   GI/Hepatic negative GI ROS, Neg liver ROS,   Endo/Other  negative endocrine ROS  Renal/GU negative Renal ROS  negative genitourinary   Musculoskeletal negative musculoskeletal ROS (+)   Abdominal   Peds negative pediatric ROS (+)  Hematology negative hematology ROS (+)   Anesthesia Other Findings Status:  Final result Visible to patient:  No (Not Released) Next appt:  None   Ref Range & Units 4d ago 29mo ago Preg, Serum NEGATIVE NEGATIVE  NEGATIVE CM   Ref Range & Units 4d ago (12/03/17) 29mo ago (10/28/17) 29mo ago (10/27/17) 29mo ago (10/20/17) WBC 4.0 - 10.5 K/uL 6.9  9.7  8.0  7.0  RBC 3.87 - 5.11 MIL/uL 4.13  4.01  4.13  4.27  Hemoglobin 12.0 - 15.0 g/dL 40.111.2 Abnormally low   02.710.9 Abnormally low   11.3 Abnormally low   11.8 Abnormally low   HCT 36.0 - 46.0 % 34.7 Abnormally low   34.7 Abnormally low   35.7 Abnormally low   36.7       Reproductive/Obstetrics negative OB ROS                             Anesthesia Physical Anesthesia Plan  ASA: III  Anesthesia Plan: General   Post-op Pain Management:    Induction: Intravenous  PONV Risk Score and Plan: 4 or greater and Treatment may vary due to age or medical condition, Ondansetron, Dexamethasone, Midazolam and Scopolamine patch - Pre-op  Airway Management Planned: Oral ETT  Additional Equipment:    Intra-op Plan:   Post-operative Plan:   Informed Consent: I have reviewed the patients History and Physical, chart, labs and discussed the procedure including the risks, benefits and alternatives for the proposed anesthesia with the patient or authorized representative who has indicated his/her understanding and acceptance.     Plan Discussed with: CRNA and Surgeon  Anesthesia Plan Comments:         Anesthesia Quick Evaluation

## 2017-12-07 NOTE — Anesthesia Procedure Notes (Signed)
Procedure Name: Intubation Date/Time: 12/07/2017 1:10 PM Performed by: Lavina Hamman, CRNA Pre-anesthesia Checklist: Patient identified, Emergency Drugs available, Suction available, Patient being monitored and Timeout performed Patient Re-evaluated:Patient Re-evaluated prior to induction Oxygen Delivery Method: Circle system utilized Preoxygenation: Pre-oxygenation with 100% oxygen Induction Type: IV induction Ventilation: Mask ventilation without difficulty Laryngoscope Size: Mac and 4 Grade View: Grade I Tube type: Oral Tube size: 7.5 mm Number of attempts: 1 Airway Equipment and Method: Stylet Placement Confirmation: ETT inserted through vocal cords under direct vision,  positive ETCO2,  CO2 detector and breath sounds checked- equal and bilateral Secured at: 22 cm Tube secured with: Tape Dental Injury: Teeth and Oropharynx as per pre-operative assessment

## 2017-12-08 DIAGNOSIS — N200 Calculus of kidney: Secondary | ICD-10-CM | POA: Diagnosis not present

## 2017-12-08 LAB — CBC
HEMATOCRIT: 32.3 % — AB (ref 36.0–46.0)
HEMOGLOBIN: 10.4 g/dL — AB (ref 12.0–15.0)
MCH: 27.2 pg (ref 26.0–34.0)
MCHC: 32.2 g/dL (ref 30.0–36.0)
MCV: 84.6 fL (ref 78.0–100.0)
PLATELETS: 389 10*3/uL (ref 150–400)
RBC: 3.82 MIL/uL — AB (ref 3.87–5.11)
RDW: 14.8 % (ref 11.5–15.5)
WBC: 13.6 10*3/uL — AB (ref 4.0–10.5)

## 2017-12-08 LAB — BASIC METABOLIC PANEL
ANION GAP: 5 (ref 5–15)
BUN: 10 mg/dL (ref 6–20)
CHLORIDE: 104 mmol/L (ref 101–111)
CO2: 24 mmol/L (ref 22–32)
Calcium: 8.5 mg/dL — ABNORMAL LOW (ref 8.9–10.3)
Creatinine, Ser: 1.19 mg/dL — ABNORMAL HIGH (ref 0.44–1.00)
GFR calc Af Amer: >60 mL/min (ref >60)
GFR calc non Af Amer: 56 mL/min — ABNORMAL LOW (ref >60)
GLUCOSE: 148 mg/dL — AB (ref 65–99)
POTASSIUM: 4.4 mmol/L (ref 3.5–5.1)
Sodium: 133 mmol/L — ABNORMAL LOW (ref 135–145)

## 2017-12-08 MED ORDER — SULFAMETHOXAZOLE-TRIMETHOPRIM 800-160 MG PO TABS: 1.0000 | ORAL_TABLET | Freq: Every day | ORAL | 0 refills | Status: DC

## 2017-12-08 MED ORDER — OXYCODONE-ACETAMINOPHEN 5-325 MG PO TABS: 1.0000 | ORAL_TABLET | ORAL | 0 refills | Status: DC | PRN

## 2017-12-08 NOTE — Care Management Note (Signed)
Case Management Note  Patient Details  Name: Teresa Osborne MRN: [id number] Date of Birth: 09-May-1976  Subjective/Objective:  42 y/o f admitted w/renal calculus. From home.                  Action/Plan:d/c home.   Expected Discharge Date:  12/08/17               Expected Discharge Plan:  Home/Self Care  In-House Referral:     Discharge planning Services  CM Consult  Post Acute Care Choice:    Choice offered to:     DME Arranged:    DME Agency:     HH Arranged:    HH Agency:     Status of Service:  Completed, signed off  If discussed at [employer] Length of Stay Meetings, dates discussed:    Additional Comments:  Lanier ClamMahabir, Texie Tupou, RN 12/08/2017, 9:44 AM

## 2017-12-08 NOTE — Discharge Instructions (Signed)
Percutaneous Nephrostomy, Care After  This sheet gives you information about how to care for yourself after your procedure. Your health care provider may also give you more specific instructions. If you have problems or questions, contact your health care provider.  What can I expect after the procedure?  After the procedure, it is common to have:  · Some soreness where the nephrostomy tube was inserted (tube insertion site).  · Blood-tinged drainage from the nephrostomy tube for the first 24 hours.    Follow these instructions at home:  Activity  · Return to your normal activities as told by your health care provider. Ask your health care provider what activities are safe for you.  · Avoid activities that may cause the nephrostomy tubing to bend.  · Do not take baths, swim, or use a hot tub until your health care provider approves. Ask your health care provider if you can take showers. Cover the nephrostomy tube dressing with a watertight covering when you take a shower.  · Do not drive for 24 hours if you were given a medicine to help you relax (sedative).  Care of the tube insertion site  · Follow instructions from your health care provider about how to take care of your tube insertion site. Make sure you:  ? Wash your hands with soap and water before you change your bandage (dressing). If soap and water are not available, use hand sanitizer.  ? Change your dressing as told by your health care provider. Be careful not to pull on the tube while removing the dressing.  ? When you change the dressing, wash the skin around the tube, rinse well, and pat the skin dry.  · Check the tube insertion area every day for signs of infection. Check for:  ? More redness, swelling, or pain.  ? More fluid or blood.  ? Warmth.  ? Pus or a bad smell.  Care of the nephrostomy tube and drainage bag  · Always keep the tubing, the leg bag, or the bedside drainage bags below the level of the kidney so that your urine drains  freely.  · When connecting your nephrostomy tube to a drainage bag, make sure that there are no kinks in the tubing and that your urine is draining freely. You may want to use an elastic bandage to wrap any exposed tubing that goes from the nephrostomy tube to any of the connecting tubes.  · At night, you may want to connect your nephrostomy tube or the leg bag to a larger bedside drainage bag.  · Follow instructions from your health care provider about how to empty or change the drainage bag.  · Empty the drainage bag when it becomes ? full.  · Replace the drainage bag and any extension tubing that is connected to your nephrostomy tube every 3 weeks or as often as told by your health care provider. Your health care provider will explain how to change the drainage bag and extension tubing.  General instructions  · Take over-the-counter and prescription medicines only as told by your health care provider.  · Keep all follow-up visits as told by your health care provider. This is important.  Contact a health care provider if:  · You have problems with any of the valves or tubing.  · You have persistent pain or soreness in your back.  · You have more redness, swelling, or pain around your tube insertion site.  · You have more fluid or blood coming from   your tube insertion site.  · Your tube insertion site feels warm to the touch.  · You have pus or a bad smell coming from your tube insertion site.  · You have increased urine output or you feel burning when urinating.  Get help right away if:  · You have pain in your abdomen during the first week.  · You have chest pain or have trouble breathing.  · You have a new appearance of blood in your urine.  · You have a fever or chills.  · You have back pain that is not relieved by your medicine.  · You have decreased urine output.  · Your nephrostomy tube comes out.  This information is not intended to replace advice given to you by your health care provider. Make sure you  discuss any questions you have with your health care provider.  Document Released: 07/10/2004 Document Revised: 08/29/2016 Document Reviewed: 08/29/2016  Elsevier Interactive Patient Education © 2018 [employer]vier Inc[employer]

## 2017-12-08 NOTE — Anesthesia Postprocedure Evaluation (Signed)
Anesthesia Post Note  Patient: Teresa Osborne  Procedure(s) Performed: LEFT NEPHROLITHOTOMY PERCUTANEOUS AND STENT PLACEMENT (Left )     Patient location during evaluation: PACU Anesthesia Type: General Level of consciousness: awake and alert Pain management: pain level controlled Vital Signs Assessment: post-procedure vital signs reviewed and stable Respiratory status: spontaneous breathing, nonlabored ventilation, respiratory function stable and patient connected to nasal cannula oxygen Cardiovascular status: blood pressure returned to baseline and stable Postop Assessment: no apparent nausea or vomiting Anesthetic complications: no    Last Vitals:  Vitals:   12/07/17 2314 12/08/17 0610  BP: 133/69 129/68  Pulse: (!) 108 75  Resp: 20 20  Temp: 37.3 C 36.8 C  SpO2: 96% 96%    Last Pain:  Vitals:   12/08/17 0652  TempSrc:   PainSc: Asleep   Pain Goal: Patients Stated Pain Goal: 3 (12/07/17 1941)               Jiles GarterJACKSON,Tamikka Pilger EDWARD

## 2017-12-12 NOTE — Discharge Summary (Signed)
Physician Discharge Summary   Patient ID: Teresa Osborne [id number] 41 y.o. December 19, 1975  Admit date: 12/07/2017  Discharge date and time: 12/08/2017 10:15 AM   Admitting Physician: Malen GauzePatrick L Kwamaine Cuppett, MD   Discharge Physician: Ronne BinningMckenzie  Admission Diagnoses: LEFT RENAL CALCULUS  Discharge Diagnoses: left renal calculus  Admission Condition: good  Discharged Condition: good  Indication for Admission: post operative monitoring, concern for sepsis  Hospital Course: The patient tolerated the procedure well and was transferred to the floor on IV pain meds, IV fluid. On POD#1 foley was removed, pt was started on regular diet and they ambulated in the halls. Prior to discharge the pt was tolerating a regular diet, pain was controlled on PO pain meds, they were ambulating without difficulty, and they had normal bowel function.   Consults: None  Significant Diagnostic Studies: none  Treatments: surgery: L PCNL  Discharge Exam: BP 129/68 (BP Location: Right Wrist)   Pulse 75   Temp 98.3 F (36.8 C) (Oral)   Resp 20   Ht 5\' 6"  (1.676 m)   Wt (!) 186.2 kg (410 lb 7.9 oz)   SpO2 96%   BMI 66.26 kg/m  General appearance: alert, cooperative and appears stated age Head: Normocephalic, without obvious abnormality, atraumatic Nose: Nares normal. Septum midline. Mucosa normal. No drainage or sinus tenderness. Neck: no adenopathy, no carotid bruit, no JVD, supple, symmetrical, trachea midline and thyroid not enlarged, symmetric, no tenderness/mass/nodules Lungs: clear to auscultation bilaterally Heart: regular rate and rhythm, S1, S2 normal, no murmur, click, rub or gallop Abdomen: soft, non-tender; bowel sounds normal; no masses,  no organomegaly Extremities: extremities normal, atraumatic, no cyanosis or edema Skin: Skin color, texture, turgor normal. No rashes or lesions  Disposition: 01-Home or Self Care  Patient Instructions:  Allergies as of 12/08/2017   No Known Allergies      Medication List    TAKE these medications   ibuprofen 200 MG tablet Commonly known as:  ADVIL,MOTRIN Take 600 mg by mouth every 6 (six) hours as needed for moderate pain.   levonorgestrel 20 MCG/24HR IUD Commonly known as:  MIRENA 1 each once by Intrauterine route.   oxyCODONE-acetaminophen 5-325 MG tablet Commonly known as:  PERCOCET/ROXICET Take 1 tablet by mouth every 4 (four) hours as needed for moderate pain. What changed:  how much to take   sulfamethoxazole-trimethoprim 800-160 MG tablet Commonly known as:  BACTRIM DS,SEPTRA DS Take 1 tablet by mouth daily. What changed:  when to take this      Activity: activity as tolerated Diet: regular diet Wound Care: reinforce dressing PRN  Follow-up with Dr. Ronne BinningMckenzie in 1 week.  SignedWilkie Aye: Teresa Osborne 12/12/2017 2:55 PM

## 2017-12-21 ENCOUNTER — Other Ambulatory Visit: Payer: Self-pay | Admitting: Urology

## 2017-12-30 ENCOUNTER — Encounter (HOSPITAL_COMMUNITY): Payer: Self-pay | Admitting: *Deleted

## 2017-12-30 ENCOUNTER — Other Ambulatory Visit: Payer: Self-pay

## 2017-12-30 ENCOUNTER — Other Ambulatory Visit (HOSPITAL_COMMUNITY): Payer: Self-pay | Admitting: *Deleted

## 2018-01-04 ENCOUNTER — Encounter: Payer: Self-pay | Admitting: Family Medicine

## 2018-01-04 ENCOUNTER — Ambulatory Visit: Payer: Managed Care, Other (non HMO) | Admitting: Family Medicine

## 2018-01-04 VITALS — BP 140/79 | HR 95 | Temp 98.7°F | Ht 66.0 in | Wt >= 6400 oz

## 2018-01-04 DIAGNOSIS — R6889 Other general symptoms and signs: Secondary | ICD-10-CM | POA: Diagnosis not present

## 2018-01-04 DIAGNOSIS — J101 Influenza due to other identified influenza virus with other respiratory manifestations: Secondary | ICD-10-CM | POA: Diagnosis not present

## 2018-01-04 LAB — POC INFLUENZA A&B (BINAX/QUICKVUE)
INFLUENZA B, POC: NEGATIVE
Influenza A, POC: POSITIVE — AB

## 2018-01-04 MED ORDER — OSELTAMIVIR PHOSPHATE 75 MG PO CAPS: 75.0000 mg | ORAL_CAPSULE | Freq: Two times a day (BID) | ORAL | 0 refills | Status: DC

## 2018-01-04 MED ORDER — ONDANSETRON HCL 4 MG PO TABS: 4.0000 mg | ORAL_TABLET | Freq: Three times a day (TID) | ORAL | 0 refills | Status: DC | PRN

## 2018-01-04 NOTE — Patient Instructions (Signed)
You are positive for influenza A.  Start tamiflu today, and take every 12 hours for 5 days.  Avoid contact with others if possible.  Drink plenty of water.  Wash your hands.    Influenza, Adult Influenza ("the flu") is an infection in the lungs, nose, and throat (respiratory tract). It is caused by a virus. The flu causes many common cold symptoms, as well as a high fever and body aches. It can make you feel very sick. The flu spreads easily from person to person (is contagious). Getting a flu shot (influenza vaccination) every year is the best way to prevent the flu. Follow these instructions at home:  Take over-the-counter and prescription medicines only as told by your doctor.  Use a cool mist humidifier to add moisture (humidity) to the air in your home. This can make it easier to breathe.  Rest as needed.  Drink enough fluid to keep your pee (urine) clear or pale yellow.  Cover your mouth and nose when you cough or sneeze.  Wash your hands with soap and water often, especially after you cough or sneeze. If you cannot use soap and water, use hand sanitizer.  Stay home from work or school as told by your doctor. Unless you are visiting your doctor, try to avoid leaving home until your fever has been gone for 24 hours without the use of medicine.  Keep all follow-up visits as told by your doctor. This is important. How is this prevented?  Getting a yearly (annual) flu shot is the best way to avoid getting the flu. You may get the flu shot in late summer, fall, or winter. Ask your doctor when you should get your flu shot.  Wash your hands often or use hand sanitizer often.  Avoid contact with people who are sick during cold and flu season.  Eat healthy foods.  Drink plenty of fluids.  Get enough sleep.  Exercise regularly. Contact a doctor if:  You get new symptoms.  You have: ? Chest pain. ? Watery poop (diarrhea). ? A fever.  Your cough gets worse.  You start  to have more mucus.  You feel sick to your stomach (nauseous).  You throw up (vomit). Get help right away if:  You start to be short of breath or have trouble breathing.  Your skin or nails turn a bluish color.  You have very bad pain or stiffness in your neck.  You get a sudden headache.  You get sudden pain in your face or ear.  You cannot stop throwing up. This information is not intended to replace advice given to you by your health care provider. Make sure you discuss any questions you have with your health care provider. Document Released: 08/26/2008 Document Revised: 04/24/2016 Document Reviewed: 09/11/2015 Elsevier Interactive Patient Education  2017 [employer]

## 2018-01-04 NOTE — Progress Notes (Signed)
Teresa Osborne , 06/09/1976, 42 y.o., female MRN: [id number] Patient Care Team    Relationship Specialty Notifications Start End  Natalia Leatherwood, DO PCP - General Family Medicine  01/09/16     Chief Complaint  Patient presents with  . flu-like symptoms    pt c/o of cough, headche, fever X 1 day try ibuprofen for relief     Subjective: Pt presents for an OV with complaints of cough of 1 day duration.  Associated symptoms include headache, fever, body aches, nasal congestion, cough and nausea. She has not had her flu shot this season. She has been in the hospital setting multiple times over last few weeks for renal calculus/ nephrostomy tube. She report her tube fell out last week. The site is negative for drainage or redness.  Depression screen [facility] 2/9 09/14/2017 09/11/2017 01/09/2016  Decreased Interest 0 0 0  Down, Depressed, Hopeless 0 0 0  PHQ - 2 Score 0 0 0    No Known Allergies Social History   Tobacco Use  . Smoking status: Never Smoker  . Smokeless tobacco: Never Used  Substance Use Topics  . Alcohol use: No   Past Medical History:  Diagnosis Date  . Anxiety   . History of kidney stones   . Kidney stones    Staghorn  . PONV (postoperative nausea and vomiting)    NOV 2018, JAN TREMORS LASTED ABOUT 15 MINUTES, LIKES SCOPOLOMINE PATCH  . Pre-diabetes    Past Surgical History:  Procedure Laterality Date  . CYSTOSCOPY  10/27/2017   Procedure: CYSTOSCOPY FLEXIBLE;  Surgeon: Malen Gauze, MD;  Location: [facility];  Service: Urology;;  . NEPHROLITHOTOMY Left 12/07/2017   Procedure: LEFT NEPHROLITHOTOMY PERCUTANEOUS AND STENT PLACEMENT;  Surgeon: Malen Gauze, MD;  Location: [facility];  Service: Urology;  Laterality: Left;  . NEPHROSTOMY Left 10/27/2017   Procedure: NEPHROSTOMY TUBE PLACEMENT;  Surgeon: Malen Gauze, MD;  Location: [facility];  Service: Urology;  Laterality: Left;  . NO PAST SURGERIES     Family History  Problem Relation Age of Onset  .  Cancer Paternal Grandfather        pancreatic  . Alcohol abuse Paternal Grandfather   . Arthritis Paternal Grandfather   . COPD Paternal Grandfather   . Early death Paternal Grandfather   . Cancer Paternal Grandmother        unknown, metz  . Arthritis Paternal Grandmother   . COPD Paternal Grandmother   . Early death Paternal Grandmother   . Arthritis Father   . Mental retardation Father   . Arthritis Maternal Grandmother   . COPD Maternal Grandfather   . Heart disease Maternal Grandfather   . Cancer Sister 83       ovarian   Allergies as of 01/04/2018   No Known Allergies     Medication List        Accurate as of 01/04/18  1:30 PM. Always use your most recent med list.          ibuprofen 200 MG tablet Commonly known as:  ADVIL,MOTRIN Take 600 mg by mouth 2 (two) times daily as needed for moderate pain (averages taking every other day).   levonorgestrel 20 MCG/24HR IUD Commonly known as:  MIRENA 1 each once by Intrauterine route.   ondansetron 4 MG tablet Commonly known as:  ZOFRAN Take 1 tablet (4 mg total) by mouth every 8 (eight) hours as needed for nausea or vomiting.   oseltamivir 75 MG capsule  Commonly known as:  TAMIFLU Take 1 capsule (75 mg total) by mouth 2 (two) times daily.   oxyCODONE-acetaminophen 5-325 MG tablet Commonly known as:  PERCOCET/ROXICET Take 1 tablet by mouth every 4 (four) hours as needed for moderate pain.   sulfamethoxazole-trimethoprim 800-160 MG tablet Commonly known as:  BACTRIM DS,SEPTRA DS Take 1 tablet by mouth daily.       All past medical history, surgical history, allergies, family history, immunizations andmedications were updated in the EMR today and reviewed under the history and medication portions of their EMR.     ROS: Negative, with the exception of above mentioned in HPI   Objective:  BP 140/79 (BP Location: Right Wrist, Patient Position: Sitting, Cuff Size: Normal)   Pulse 95   Temp 98.7 F (37.1 C) (Oral)    Ht 5\' 6"  (1.676 m)   Wt (!) 401 lb 1.9 oz (181.9 kg)   SpO2 95%   BMI 64.74 kg/m  Body mass index is 64.74 kg/m. Gen: Afebrile. No acute distress. Nontoxic in appearance, well developed, well nourished.  HENT: AT. . Bilateral TM visualized without erythema or fullness. MMM, no oral lesions. Bilateral nares with mild drainage. Throat without erythema or exudates. No cough or hoarseness on exam.  Eyes:Pupils Equal Round Reactive to light, Extraocular movements intact,  Conjunctiva without redness, discharge or icterus. Neck/lymp/endocrine: Supple,no lymphadenopathy CV: RRR  Chest: CTAB, no wheeze or crackles.  Abd: Soft. NTND. BS present.  Skin: no rashes, purpura or petechiae.  Neuro: Normal gait. PERLA. EOMi. Alert. Oriented x3   No exam data present No results found. Results for orders placed or performed in visit on 01/04/18 (from the past 24 hour(s))  POC Influenza A&B (Binax test)     Status: Abnormal   Collection Time: 01/04/18  1:23 PM  Result Value Ref Range   Influenza A, POC Positive (A) Negative   Influenza B, POC Negative Negative    Assessment/Plan: Teresa Osborne is a 42 y.o. female present for OV for   Flu-like symptoms/influenza A - POC Influenza A&B (Binax test)--> POSiTIVE A - tamiflu BID. Rest, hydrate. OTC supportive care.  F/U 2 week if symptoms not resolved, sooner if worsening.    Reviewed expectations re: course of current medical issues.  Discussed self-management of symptoms.  Outlined signs and symptoms indicating need for more acute intervention.  Patient verbalized understanding and all questions were answered.  Patient received an After-Visit Summary.    Orders Placed This Encounter  Procedures  . POC Influenza A&B (Binax test)     Note is dictated utilizing voice recognition software. Although note has been proof read prior to signing, occasional typographical errors still can be missed. If any questions arise, please do not  hesitate to call for verification.   electronically signed by:  Felix Pacinienee Kesi Perrow, DO  [facility] - OR

## 2018-01-07 ENCOUNTER — Other Ambulatory Visit: Payer: Self-pay | Admitting: Urology

## 2018-01-08 ENCOUNTER — Ambulatory Visit (HOSPITAL_COMMUNITY): Admission: RE | Admit: 2018-01-08 | Payer: Managed Care, Other (non HMO) | Source: Ambulatory Visit | Admitting: Urology

## 2018-01-08 SURGERY — NEPHROLITHOTOMY PERCUTANEOUS SECOND LOOK
Anesthesia: General | Laterality: Left

## 2018-01-18 NOTE — Patient Instructions (Addendum)
Judi SaaBeverly Brockmann  01/18/2018   Your procedure is scheduled on: 01-21-18   Report [facility] Main  Entrance    Follow signs to Short Stay on first floor at 530 AM    Call this number if you have problems the morning of surgery [phone]    Remember: Do not eat food or drink liquids :After Midnight.     Take these medicines the morning of surgery with A SIP OF WATER: bactrim, percocet as needed                                You may not have any metal on your body including hair pins and              piercings  Do not wear jewelry, make-up, lotions, powders or perfumes, deodorant             Do not wear nail polish.  Do not shave  48 hours prior to surgery.            Do not bring valuables to the hospital. [facility] IS NOT             RESPONSIBLE   FOR VALUABLES.  Contacts, dentures or bridgework may not be worn into surgery.      Patients discharged the day of surgery will not be allowed to drive home.  Name and phone number of your driver:  Special Instructions: N/A              Please read over the following fact sheets you were given: _____________________________________________________________________             [facility] - Preparing for Surgery Before surgery, you can play an important role.  Because skin is not sterile, your skin needs to be as free of germs as possible.  You can reduce the number of germs on your skin by washing with CHG (chlorahexidine gluconate) soap before surgery.  CHG is an antiseptic cleaner which kills germs and bonds with the skin to continue killing germs even after washing. Please DO NOT use if you have an allergy to CHG or antibacterial soaps.  If your skin becomes reddened/irritated stop using the CHG and inform your nurse when you arrive at Short Stay. Do not shave (including legs and underarms) for at least 48 hours prior to the first CHG shower.  You may shave your face/neck. Please follow these  instructions carefully:  1.  Shower with CHG Soap the night before surgery and the  morning of Surgery.  2.  If you choose to wash your hair, wash your hair first as usual with your  normal  shampoo.  3.  After you shampoo, rinse your hair and body thoroughly to remove the  shampoo.                           4.  Use CHG as you would any other liquid soap.  You can apply chg directly  to the skin and wash                       Gently with a scrungie or clean washcloth.  5.  Apply the CHG Soap to your body ONLY FROM THE NECK DOWN.   Do not use  on face/ open                           Wound or open sores. Avoid contact with eyes, ears mouth and genitals (private parts).                       Wash face,  Genitals (private parts) with your normal soap.             6.  Wash thoroughly, paying special attention to the area where your surgery  will be performed.  7.  Thoroughly rinse your body with warm water from the neck down.  8.  DO NOT shower/wash with your normal soap after using and rinsing off  the CHG Soap.                9.  Pat yourself dry with a clean towel.            10.  Wear clean pajamas.            11.  Place clean sheets on your bed the night of your first shower and do not  sleep with pets. Day of Surgery : Do not apply any lotions/deodorants the morning of surgery.  Please wear clean clothes to the hospital/surgery center.  FAILURE TO FOLLOW THESE INSTRUCTIONS MAY RESULT IN THE CANCELLATION OF YOUR SURGERY PATIENT SIGNATURE_________________________________  NURSE SIGNATURE__________________________________  ________________________________________________________________________

## 2018-01-19 ENCOUNTER — Encounter (HOSPITAL_COMMUNITY)
Admission: RE | Admit: 2018-01-19 | Discharge: 2018-01-19 | Disposition: A | Discharge status: Home / Self Care | Payer: Managed Care, Other (non HMO) | Source: Ambulatory Visit | Attending: Urology | Admitting: Urology

## 2018-01-19 ENCOUNTER — Encounter (HOSPITAL_COMMUNITY): Payer: Self-pay

## 2018-01-19 ENCOUNTER — Other Ambulatory Visit: Payer: Self-pay

## 2018-01-19 DIAGNOSIS — Z87442 Personal history of urinary calculi: Secondary | ICD-10-CM | POA: Diagnosis not present

## 2018-01-19 DIAGNOSIS — F419 Anxiety disorder, unspecified: Secondary | ICD-10-CM | POA: Diagnosis not present

## 2018-01-19 DIAGNOSIS — Z79899 Other long term (current) drug therapy: Secondary | ICD-10-CM | POA: Diagnosis not present

## 2018-01-19 DIAGNOSIS — R7303 Prediabetes: Secondary | ICD-10-CM | POA: Diagnosis not present

## 2018-01-19 DIAGNOSIS — N200 Calculus of kidney: Secondary | ICD-10-CM | POA: Diagnosis not present

## 2018-01-19 LAB — CBC
HCT: 39 % (ref 36.0–46.0)
HEMOGLOBIN: 12.4 g/dL (ref 12.0–15.0)
MCH: 27.1 pg (ref 26.0–34.0)
MCHC: 31.8 g/dL (ref 30.0–36.0)
MCV: 85.3 fL (ref 78.0–100.0)
Platelets: 327 10*3/uL (ref 150–400)
RBC: 4.57 MIL/uL (ref 3.87–5.11)
RDW: 15.7 % — ABNORMAL HIGH (ref 11.5–15.5)
WBC: 5.2 10*3/uL (ref 4.0–10.5)

## 2018-01-19 LAB — BASIC METABOLIC PANEL
Anion gap: 8 (ref 5–15)
BUN: 14 mg/dL (ref 6–20)
CALCIUM: 8.9 mg/dL (ref 8.9–10.3)
CO2: 22 mmol/L (ref 22–32)
Chloride: 108 mmol/L (ref 101–111)
Creatinine, Ser: 1.22 mg/dL — ABNORMAL HIGH (ref 0.44–1.00)
GFR, EST NON AFRICAN AMERICAN: 54 mL/min — AB (ref >60)
GLUCOSE: 94 mg/dL (ref 65–99)
Potassium: 4.6 mmol/L (ref 3.5–5.1)
SODIUM: 138 mmol/L (ref 135–145)

## 2018-01-19 LAB — HCG, SERUM, QUALITATIVE: PREG SERUM: NEGATIVE

## 2018-01-21 ENCOUNTER — Ambulatory Visit (HOSPITAL_COMMUNITY): Payer: Managed Care, Other (non HMO) | Admitting: Anesthesiology

## 2018-01-21 ENCOUNTER — Encounter (HOSPITAL_COMMUNITY): Payer: Self-pay | Admitting: Anesthesiology

## 2018-01-21 ENCOUNTER — Encounter (HOSPITAL_COMMUNITY)
Admission: RE | Disposition: A | Discharge status: Home / Self Care | Payer: Self-pay | Source: Ambulatory Visit | Attending: Urology

## 2018-01-21 ENCOUNTER — Ambulatory Visit (HOSPITAL_COMMUNITY)
Admission: RE | Admit: 2018-01-21 | Discharge: 2018-01-21 | Disposition: A | Discharge status: Home / Self Care | Payer: Managed Care, Other (non HMO) | Source: Ambulatory Visit | Attending: Urology | Admitting: Urology

## 2018-01-21 ENCOUNTER — Ambulatory Visit (HOSPITAL_COMMUNITY): Payer: Managed Care, Other (non HMO)

## 2018-01-21 DIAGNOSIS — F419 Anxiety disorder, unspecified: Secondary | ICD-10-CM | POA: Insufficient documentation

## 2018-01-21 DIAGNOSIS — N200 Calculus of kidney: Secondary | ICD-10-CM | POA: Diagnosis not present

## 2018-01-21 DIAGNOSIS — Z79899 Other long term (current) drug therapy: Secondary | ICD-10-CM | POA: Insufficient documentation

## 2018-01-21 DIAGNOSIS — Z87442 Personal history of urinary calculi: Secondary | ICD-10-CM | POA: Insufficient documentation

## 2018-01-21 DIAGNOSIS — R7303 Prediabetes: Secondary | ICD-10-CM | POA: Insufficient documentation

## 2018-01-21 HISTORY — PX: CYSTOSCOPY WITH RETROGRADE PYELOGRAM, URETEROSCOPY AND STENT PLACEMENT: SHX5789

## 2018-01-21 HISTORY — PX: HOLMIUM LASER APPLICATION: SHX5852

## 2018-01-21 SURGERY — HOLMIUM LASER APPLICATION
Anesthesia: General | Laterality: Left

## 2018-01-21 MED ORDER — ONDANSETRON HCL 4 MG PO TABS: 4.0000 mg | ORAL_TABLET | Freq: Three times a day (TID) | ORAL | 0 refills | Status: DC | PRN

## 2018-01-21 MED ORDER — SCOPOLAMINE 1 MG/3DAYS TD PT72
MEDICATED_PATCH | TRANSDERMAL | Status: DC | PRN
  Administered 2018-01-21: 1 via TRANSDERMAL

## 2018-01-21 MED ORDER — SODIUM CHLORIDE 0.9 % IR SOLN
Status: DC | PRN
  Administered 2018-01-21: 3000 mL via INTRAVESICAL

## 2018-01-21 MED ORDER — FENTANYL CITRATE (PF) 250 MCG/5ML IJ SOLN
INTRAMUSCULAR | Status: AC
  Filled 2018-01-21: qty 5

## 2018-01-21 MED ORDER — PROPOFOL 10 MG/ML IV BOLUS
INTRAVENOUS | Status: DC | PRN
  Administered 2018-01-21: 200 mg via INTRAVENOUS

## 2018-01-21 MED ORDER — SCOPOLAMINE 1 MG/3DAYS TD PT72
MEDICATED_PATCH | TRANSDERMAL | Status: AC
  Filled 2018-01-21: qty 1

## 2018-01-21 MED ORDER — ACETAMINOPHEN 10 MG/ML IV SOLN
INTRAVENOUS | Status: AC
  Filled 2018-01-21: qty 100

## 2018-01-21 MED ORDER — FENTANYL CITRATE (PF) 100 MCG/2ML IJ SOLN
INTRAMUSCULAR | Status: DC | PRN
  Administered 2018-01-21: 100 ug via INTRAVENOUS
  Administered 2018-01-21 (×3): 50 ug via INTRAVENOUS

## 2018-01-21 MED ORDER — MIDAZOLAM HCL 2 MG/2ML IJ SOLN
INTRAMUSCULAR | Status: DC | PRN
  Administered 2018-01-21 (×2): 1 mg via INTRAVENOUS

## 2018-01-21 MED ORDER — PROMETHAZINE HCL 25 MG/ML IJ SOLN
6.2500 mg | INTRAMUSCULAR | Status: DC | PRN
  Administered 2018-01-21: 6.25 mg via INTRAVENOUS

## 2018-01-21 MED ORDER — SUGAMMADEX SODIUM 200 MG/2ML IV SOLN
INTRAVENOUS | Status: DC | PRN
  Administered 2018-01-21: 400 mg via INTRAVENOUS

## 2018-01-21 MED ORDER — ROCURONIUM BROMIDE 10 MG/ML (PF) SYRINGE
PREFILLED_SYRINGE | INTRAVENOUS | Status: AC
  Filled 2018-01-21: qty 5

## 2018-01-21 MED ORDER — PROPOFOL 10 MG/ML IV BOLUS
INTRAVENOUS | Status: AC
  Filled 2018-01-21: qty 40

## 2018-01-21 MED ORDER — SUGAMMADEX SODIUM 500 MG/5ML IV SOLN
INTRAVENOUS | Status: AC
  Filled 2018-01-21: qty 5

## 2018-01-21 MED ORDER — SUCCINYLCHOLINE CHLORIDE 200 MG/10ML IV SOSY
PREFILLED_SYRINGE | INTRAVENOUS | Status: DC | PRN
  Administered 2018-01-21: 140 mg via INTRAVENOUS

## 2018-01-21 MED ORDER — SUCCINYLCHOLINE CHLORIDE 200 MG/10ML IV SOSY
PREFILLED_SYRINGE | INTRAVENOUS | Status: AC
  Filled 2018-01-21: qty 10

## 2018-01-21 MED ORDER — IOHEXOL 300 MG/ML  SOLN
INTRAMUSCULAR | Status: DC | PRN
  Administered 2018-01-21: 20 mL via URETHRAL

## 2018-01-21 MED ORDER — LACTATED RINGERS IV SOLN
INTRAVENOUS | Status: DC | PRN
  Administered 2018-01-21 (×2): via INTRAVENOUS

## 2018-01-21 MED ORDER — MIDAZOLAM HCL 2 MG/2ML IJ SOLN
INTRAMUSCULAR | Status: AC
  Filled 2018-01-21: qty 2

## 2018-01-21 MED ORDER — SODIUM CHLORIDE 0.9 % IV SOLN
2.0000 g | INTRAVENOUS | Status: AC
  Administered 2018-01-21: 2 g via INTRAVENOUS
  Filled 2018-01-21: qty 20

## 2018-01-21 MED ORDER — SULFAMETHOXAZOLE-TRIMETHOPRIM 800-160 MG PO TABS: 1.0000 | ORAL_TABLET | Freq: Every day | ORAL | 3 refills | Status: DC

## 2018-01-21 MED ORDER — FENTANYL CITRATE (PF) 100 MCG/2ML IJ SOLN
25.0000 ug | INTRAMUSCULAR | Status: DC | PRN
  Administered 2018-01-21 (×2): 50 ug via INTRAVENOUS

## 2018-01-21 MED ORDER — OXYCODONE-ACETAMINOPHEN 5-325 MG PO TABS: 1.0000 | ORAL_TABLET | ORAL | 0 refills | Status: DC | PRN

## 2018-01-21 MED ORDER — FENTANYL CITRATE (PF) 100 MCG/2ML IJ SOLN
INTRAMUSCULAR | Status: AC
  Filled 2018-01-21: qty 2

## 2018-01-21 MED ORDER — PROMETHAZINE HCL 25 MG/ML IJ SOLN
INTRAMUSCULAR | Status: AC
  Filled 2018-01-21: qty 1

## 2018-01-21 MED ORDER — LIDOCAINE 2% (20 MG/ML) 5 ML SYRINGE
INTRAMUSCULAR | Status: AC
  Filled 2018-01-21: qty 5

## 2018-01-21 MED ORDER — ONDANSETRON HCL 4 MG/2ML IJ SOLN
INTRAMUSCULAR | Status: AC
  Filled 2018-01-21: qty 2

## 2018-01-21 MED ORDER — DEXAMETHASONE SODIUM PHOSPHATE 10 MG/ML IJ SOLN
INTRAMUSCULAR | Status: AC
  Filled 2018-01-21: qty 1

## 2018-01-21 MED ORDER — LIDOCAINE 2% (20 MG/ML) 5 ML SYRINGE
INTRAMUSCULAR | Status: DC | PRN
  Administered 2018-01-21: 60 mg via INTRAVENOUS

## 2018-01-21 MED ORDER — ACETAMINOPHEN 10 MG/ML IV SOLN
INTRAVENOUS | Status: DC | PRN
  Administered 2018-01-21: 1000 mg via INTRAVENOUS

## 2018-01-21 MED ORDER — 0.9 % SODIUM CHLORIDE (POUR BTL) OPTIME
TOPICAL | Status: DC | PRN
Start: 2018-01-21 — End: 2018-01-21
  Administered 2018-01-21: 1000 mL

## 2018-01-21 MED ORDER — ROCURONIUM BROMIDE 10 MG/ML (PF) SYRINGE
PREFILLED_SYRINGE | INTRAVENOUS | Status: DC | PRN
  Administered 2018-01-21: 10 mg via INTRAVENOUS
  Administered 2018-01-21: 30 mg via INTRAVENOUS
  Administered 2018-01-21: 10 mg via INTRAVENOUS

## 2018-01-21 MED ORDER — DEXAMETHASONE SODIUM PHOSPHATE 10 MG/ML IJ SOLN
INTRAMUSCULAR | Status: DC | PRN
  Administered 2018-01-21: 10 mg via INTRAVENOUS

## 2018-01-21 MED ORDER — ONDANSETRON HCL 4 MG/2ML IJ SOLN
INTRAMUSCULAR | Status: DC | PRN
  Administered 2018-01-21: 4 mg via INTRAVENOUS

## 2018-01-21 SURGICAL SUPPLY — 13 items
COVER SURGICAL LIGHT HANDLE (MISCELLANEOUS) ×1 IMPLANT
EXTRACTOR STONE NITINOL NGAGE (UROLOGICAL SUPPLIES) ×1 IMPLANT
FIBER LASER FLEXIVA 1000 (UROLOGICAL SUPPLIES) IMPLANT
FIBER LASER FLEXIVA 365 (UROLOGICAL SUPPLIES) IMPLANT
FIBER LASER FLEXIVA 550 (UROLOGICAL SUPPLIES) IMPLANT
FIBER LASER TRAC TIP (UROLOGICAL SUPPLIES) ×1 IMPLANT
GLOVE BIO SURGEON STRL SZ8 (GLOVE) ×1 IMPLANT
GOWN STRL REUS W/TWL LRG LVL3 (GOWN DISPOSABLE) ×1 IMPLANT
GUIDEWIRE STR DUAL SENSOR (WIRE) ×1 IMPLANT
PACK CYSTO (CUSTOM PROCEDURE TRAY) ×1 IMPLANT
SHEATH URETERAL 12FRX35CM (MISCELLANEOUS) ×1 IMPLANT
STENT URET 6FRX26 CONTOUR (STENTS) ×1 IMPLANT
TUBING UROLOGY SET (TUBING) ×1 IMPLANT

## 2018-01-21 NOTE — H&P (Signed)
Urology Admission H&P  Chief Complaint: left renal calculi  History of Present Illness: [name] is a 41yo with a hx of nephrolithiasis s/p PCNL and has residual fragments on CT. Nephrostomy tube became displaced. She has an indwelling left ureteral stent. Mild LUTS. She does have hematuria  Past Medical History:  Diagnosis Date  . Anxiety   . History of kidney stones   . Kidney stones    Staghorn  . PONV (postoperative nausea and vomiting)    NOV 2018, JAN TREMORS LASTED ABOUT 15 MINUTES, LIKES SCOPOLOMINE PATCH; no issues wuth last kidney stone surgery   . Pre-diabetes    Past Surgical History:  Procedure Laterality Date  . CYSTOSCOPY  10/27/2017   Procedure: CYSTOSCOPY FLEXIBLE;  Surgeon: Malen GauzeMcKenzie, Patrick L, MD;  Location: [facility];  Service: Urology;;  . NEPHROLITHOTOMY Left 12/07/2017   Procedure: LEFT NEPHROLITHOTOMY PERCUTANEOUS AND STENT PLACEMENT;  Surgeon: Malen GauzeMcKenzie, Patrick L, MD;  Location: [facility];  Service: Urology;  Laterality: Left;  . NEPHROSTOMY Left 10/27/2017   Procedure: NEPHROSTOMY TUBE PLACEMENT;  Surgeon: Malen GauzeMcKenzie, Patrick L, MD;  Location: [facility];  Service: Urology;  Laterality: Left;  . NO PAST SURGERIES      Home Medications:  Current Facility-Administered Medications  Medication Dose Route Frequency Provider Last Rate Last Dose  . acetaminophen (OFIRMEV) 10 MG/ML IV           . cefTRIAXone (ROCEPHIN) 2 g in sodium chloride 0.9 % 100 mL IVPB  2 g Intravenous 30 min Pre-Op McKenzie, Mardene CelestePatrick L, MD      . scopolamine (TRANSDERM-SCOP) 1 MG/3DAYS            Facility-Administered Medications Ordered in Other Encounters  Medication Dose Route Frequency Provider Last Rate Last Dose  . lactated ringers infusion    Continuous PRN Florene Routeeardon, Diana L, CRNA       Allergies: No Known Allergies  Family History  Problem Relation Age of Onset  . Cancer Paternal Grandfather        pancreatic  . Alcohol abuse Paternal Grandfather   . Arthritis Paternal Grandfather    . COPD Paternal Grandfather   . Early death Paternal Grandfather   . Cancer Paternal Grandmother        unknown, metz  . Arthritis Paternal Grandmother   . COPD Paternal Grandmother   . Early death Paternal Grandmother   . Arthritis Father   . Mental retardation Father   . Arthritis Maternal Grandmother   . COPD Maternal Grandfather   . Heart disease Maternal Grandfather   . Cancer Sister [phone]       ovarian   Social History:  reports that  has never smoked. she has never used smokeless tobacco. She reports that she does not drink alcohol or use drugs.  Review of Systems  Genitourinary: Positive for frequency, hematuria and urgency.  All other systems reviewed and are negative.   Physical Exam:  Vital signs in last 24 hours: Temp:  [98.3 F (36.8 C)] 98.3 F (36.8 C) (02/21 0543) Pulse Rate:  [96] 96 (02/21 0543) Resp:  [18] 18 (02/21 0543) BP: (149)/(81) 149/81 (02/21 0543) SpO2:  [96 %] 96 % (02/21 0543) Physical Exam  Constitutional: She is oriented to person, place, and time. She appears well-developed and well-nourished.  HENT:  Head: Normocephalic and atraumatic.  Eyes: EOM are normal. Pupils are equal, round, and reactive to light.  Neck: Normal range of motion. No thyromegaly present.  Cardiovascular: Normal rate and regular rhythm.  Respiratory: Effort normal. No respiratory distress.  GI: Soft. She exhibits no distension.  Musculoskeletal: Normal range of motion. She exhibits no edema.  Neurological: She is alert and oriented to person, place, and time.  Skin: Skin is warm and dry.  Psychiatric: She has a normal mood and affect. Her behavior is normal. Judgment and thought content normal.    Laboratory Data:  No results found for this or any previous visit (from the past 24 hour(s)). No results found for this or any previous visit (from the past 240 hour(s)). Creatinine: Recent Labs    01/19/18 0824  CREATININE 1.22*   Baseline Creatinine:  1.2  Impression/Assessment:  41yo with left renal calculi  Plan:  The risks/benefits/alternatives to left ureteroscopic stone extraction was explained to the patient and she understands and wishes to proceed with surgery  Wilkie Aye 01/21/2018, 7:37 AM

## 2018-01-21 NOTE — Anesthesia Procedure Notes (Signed)
Procedure Name: Intubation Date/Time: 01/21/2018 7:50 AM Performed by: Florene Routeeardon, Pluma Diniz L, CRNA Patient Re-evaluated:Patient Re-evaluated prior to induction Oxygen Delivery Method: Circle system utilized Preoxygenation: Pre-oxygenation with 100% oxygen Induction Type: IV induction Ventilation: Mask ventilation without difficulty and Oral airway inserted - appropriate to patient size Laryngoscope Size: Hyacinth MeekerMiller and 2 Grade View: Grade I Tube type: Oral Tube size: 7.5 mm Number of attempts: 1 Airway Equipment and Method: Stylet Placement Confirmation: ETT inserted through vocal cords under direct vision and CO2 detector Secured at: 22 cm Tube secured with: Tape Dental Injury: Teeth and Oropharynx as per pre-operative assessment

## 2018-01-21 NOTE — Transfer of Care (Signed)
Immediate Anesthesia Transfer of Care Note  Patient: Judi SaaBeverly Mouser  Procedure(s) Performed: HOLMIUM LASER APPLICATION (Left ) CYSTOSCOPY WITH RETROGRADE PYELOGRAM, URETEROSCOPY AND STENT PLACEMENT (Left )  Patient Location: PACU  Anesthesia Type:General  Level of Consciousness: awake, alert  and oriented  Airway & Oxygen Therapy: Patient Spontanous Breathing and Patient connected to face mask oxygen  Post-op Assessment: Report given to RN and Post -op Vital signs reviewed and stable  Post vital signs: Reviewed and stable  Last Vitals:  Vitals:   01/21/18 0543  BP: (!) 149/81  Pulse: 96  Resp: 18  Temp: 36.8 C  SpO2: 96%    Last Pain:  Vitals:   01/21/18 0603  TempSrc:   PainSc: 6          Complications: No apparent anesthesia complications

## 2018-01-21 NOTE — Discharge Instructions (Signed)

## 2018-01-21 NOTE — Op Note (Signed)
.  Preoperative diagnosis: left renal stone  Postoperative diagnosis: complete staghorn calculus  Procedure: 1 cystoscopy 2. Left retrograde pyelography 3.  Intraoperative fluoroscopy, under one hour, with interpretation 4.  left ureteroscopic stone manipulation with laser lithotripsy 5.  left 6 x 26 JJ stent exchange  Attending: Cleda MccreedyPatrick Mackenzie  Anesthesia: General  Estimated blood loss: None  Drains: left 6 x 26 JJ ureteral stent without tether  Specimens: stone for analysis  Antibiotics: Rocephin  Findings: complete left staghorn calculus. No hydronephrosis. Ureteral orifices in normal anatomic location.  Indications: Patient is a 42 year old female with a history of a left staghorn calculus who underwent PCNL and had residual fragments. After discussing treatment options, they decided proceed with left ureteroscopic stone manipulation.  Procedure her in detail: The patient was brought to the operating room and a brief timeout was done to ensure correct patient, correct procedure, correct site.  General anesthesia was administered patient was placed in dorsal lithotomy position.  Her genitalia was then prepped and draped in usual sterile fashion.  A rigid 22 French cystoscope was passed in the urethra and the bladder.  Bladder was inspected free masses or lesions.  the ureteral orifices were in the normal orthotopic locations. Using a grasper the left ureteral stent was brought to the urethral meatus. Through the stent a zipwire was advanced up to the renal pelvis. The stent was then removed. a 6 french ureteral catheter was then instilled into the left ureteral orifice.  a gentle retrograde was obtained and findings noted above.  we then removed the cystoscope and cannulated the left ureteral orifice with a semirigid ureteroscope.  No stone was visualized in the ureter. Once we reached the UPJ we placed a sensor wire in to the renal pelvis. We then removed the scope and advanced a  12-14x35cm access sheath up to the renal pelvis. We then used the flexible ureteroscope to perform nephroscopy. We noted a complete staghorn. We attempted to fragment the stone with a 200nm laser fiber which was unsuccessful. We then removed the access sheath and noted no injury to the left ureter. We then placed a 6 x 26 double-j ureteral stent over the original zip wire. We then removed the wire and good coil was noted in the the renal pelvis under fluoroscopy and the bladder under direct vision. l   the bladder was then drained and this concluded the procedure which was well tolerated by patient.  Complications: None  Condition: Stable, extubated, transferred to PACU  Plan: Patient is to be discharged home and be scheduled for left PCNL

## 2018-01-21 NOTE — Anesthesia Preprocedure Evaluation (Signed)
Anesthesia Evaluation  Patient identified by MRN, date of birth, ID band Patient awake    Reviewed: Allergy & Precautions, NPO status , Patient's Chart, lab work & pertinent test results  Airway Mallampati: III  TM Distance: >3 FB Neck ROM: Full    Dental no notable dental hx.    Pulmonary neg pulmonary ROS,    breath sounds clear to auscultation + decreased breath sounds      Cardiovascular negative cardio ROS Normal cardiovascular exam Rhythm:Regular Rate:Normal     Neuro/Psych negative neurological ROS  negative psych ROS   GI/Hepatic negative GI ROS, Neg liver ROS,   Endo/Other  Morbid obesity  Renal/GU negative Renal ROS  negative genitourinary   Musculoskeletal negative musculoskeletal ROS (+)   Abdominal (+) + obese,   Peds negative pediatric ROS (+)  Hematology negative hematology ROS (+)   Anesthesia Other Findings   Reproductive/Obstetrics negative OB ROS                             Anesthesia Physical Anesthesia Plan  ASA: III  Anesthesia Plan: General   Post-op Pain Management:    Induction: Intravenous  PONV Risk Score and Plan: 3 and Ondansetron, Dexamethasone, Midazolam and Treatment may vary due to age or medical condition  Airway Management Planned: Oral ETT  Additional Equipment:   Intra-op Plan:   Post-operative Plan: Extubation in OR  Informed Consent: I have reviewed the patients History and Physical, chart, labs and discussed the procedure including the risks, benefits and alternatives for the proposed anesthesia with the patient or authorized representative who has indicated his/her understanding and acceptance.   Dental advisory given  Plan Discussed with: CRNA and Surgeon  Anesthesia Plan Comments:         Anesthesia Quick Evaluation

## 2018-01-21 NOTE — Anesthesia Postprocedure Evaluation (Signed)
Anesthesia Post Note  Patient: Teresa Osborne  Procedure(s) Performed: HOLMIUM LASER APPLICATION (Left ) CYSTOSCOPY WITH RETROGRADE PYELOGRAM, URETEROSCOPY AND STENT PLACEMENT (Left )     Patient location during evaluation: PACU Anesthesia Type: General Level of consciousness: awake and alert Pain management: pain level controlled Vital Signs Assessment: post-procedure vital signs reviewed and stable Respiratory status: spontaneous breathing, nonlabored ventilation, respiratory function stable and patient connected to nasal cannula oxygen Cardiovascular status: blood pressure returned to baseline and stable Postop Assessment: no apparent nausea or vomiting Anesthetic complications: no    Last Vitals:  Vitals:   01/21/18 1000 01/21/18 1015  BP: (!) 117/58 (!) 115/59  Pulse: 64 67  Resp: 19 (!) 21  Temp:  37.1 C  SpO2: 98% 92%    Last Pain:  Vitals:   01/21/18 1015  TempSrc:   PainSc: 6                  Molleigh Huot S

## 2018-01-22 ENCOUNTER — Encounter (HOSPITAL_COMMUNITY): Payer: Self-pay | Admitting: Urology

## 2018-01-28 ENCOUNTER — Other Ambulatory Visit: Payer: Self-pay | Admitting: Urology

## 2018-01-28 DIAGNOSIS — N200 Calculus of kidney: Secondary | ICD-10-CM

## 2018-02-15 NOTE — Patient Instructions (Addendum)
Teresa Osborne  02/15/2018   Your procedure is scheduled on: Friday 02/19/2018  Report [facility]  [facility]tr[facility]              Report to Radiology  At  0915  AM    Call this number if you have problems the morning of surgery [phone]    Remember: Do not eat food or drink liquids :After Midnight.     Take these medicines the morning of surgery with A SIP OF WATER: none                                You may not have any metal on your body including hair pins and              piercings  Do not wear jewelry, make-up, lotions, powders or perfumes, deodorant             Do not wear nail polish.  Do not shave  48 hours prior to surgery.              Men may shave face and neck.   Do not bring valuables to the hospital. [facility] IS NOT             RESPONSIBLE   FOR VALUABLES.  Contacts, dentures or bridgework may not be worn into surgery.  Leave suitcase in the car. After surgery it may be brought to your room.                Please read over the following fact sheets you were given: _____________________________________________________________________             [facility] - Pre[facility] Before surgery, you can play an important role.  Because skin is not sterile, your skin needs to be as free of germs as possible.  You can reduce the number of germs on your skin by washing with CHG (chlorahexidine gluconate) soap before surgery.  CHG is an antiseptic cleaner which kills germs and bonds with the skin to continue killing germs even after washing. Please DO NOT use if you have an allergy to CHG or antibacterial soaps.  If your skin becomes reddened/irritated stop using the CHG and inform your nurse when you arrive at Short Stay. Do not shave (including legs and underarms) for at least 48 hours prior to the first CHG shower.  You may shave your face/neck. Please follow these instructions carefully:  1.  Shower with CHG Soap the night before  surgery and the  morning of Surgery.  2.  If you choose to wash your hair, wash your hair first as usual with your  normal  shampoo.  3.  After you shampoo, rinse your hair and body thoroughly to remove the  shampoo.                           4.  Use CHG as you would any other liquid soap.  You can apply chg directly  to the skin and wash                       Gently with a scrungie or clean washcloth.  5.  Apply the CHG Soap to your body ONLY FROM THE NECK DOWN.   Do  not use on face/ open                           Wound or open sores. Avoid contact with eyes, ears mouth and genitals (private parts).                       Wash face,  Genitals (private parts) with your normal soap.             6.  Wash thoroughly, paying special attention to the area where your surgery  will be performed.  7.  Thoroughly rinse your body with warm water from the neck down.  8.  DO NOT shower/wash with your normal soap after using and rinsing off  the CHG Soap.                9.  Pat yourself dry with a clean towel.            10.  Wear clean pajamas.            11.  Place clean sheets on your bed the night of your first shower and do not  sleep with pets. Day of Surgery : Do not apply any lotions/deodorants the morning of surgery.  Please wear clean clothes to the hospital/surgery center.  FAILURE TO FOLLOW THESE INSTRUCTIONS MAY RESULT IN THE CANCELLATION OF YOUR SURGERY PATIENT SIGNATURE_________________________________  NURSE SIGNATURE__________________________________  ________________________________________________________________________

## 2018-02-16 ENCOUNTER — Encounter (HOSPITAL_COMMUNITY)
Admission: RE | Admit: 2018-02-16 | Discharge: 2018-02-16 | Disposition: A | Discharge status: Home / Self Care | Payer: Managed Care, Other (non HMO) | Source: Ambulatory Visit | Attending: Urology | Admitting: Urology

## 2018-02-16 ENCOUNTER — Other Ambulatory Visit: Payer: Self-pay

## 2018-02-16 ENCOUNTER — Other Ambulatory Visit: Payer: Self-pay | Admitting: Radiology

## 2018-02-16 ENCOUNTER — Encounter (HOSPITAL_COMMUNITY): Payer: Self-pay

## 2018-02-16 DIAGNOSIS — R7303 Prediabetes: Secondary | ICD-10-CM | POA: Insufficient documentation

## 2018-02-16 DIAGNOSIS — N200 Calculus of kidney: Secondary | ICD-10-CM | POA: Insufficient documentation

## 2018-02-16 DIAGNOSIS — F419 Anxiety disorder, unspecified: Secondary | ICD-10-CM | POA: Diagnosis not present

## 2018-02-16 LAB — BASIC METABOLIC PANEL
Anion gap: 9 (ref 5–15)
BUN: 16 mg/dL (ref 6–20)
CALCIUM: 9.1 mg/dL (ref 8.9–10.3)
CO2: 24 mmol/L (ref 22–32)
CREATININE: 1.22 mg/dL — AB (ref 0.44–1.00)
Chloride: 105 mmol/L (ref 101–111)
GFR calc non Af Amer: 54 mL/min — ABNORMAL LOW (ref >60)
Glucose, Bld: 94 mg/dL (ref 65–99)
Potassium: 4.8 mmol/L (ref 3.5–5.1)
SODIUM: 138 mmol/L (ref 135–145)

## 2018-02-16 LAB — CBC
HEMATOCRIT: 38.2 % (ref 36.0–46.0)
Hemoglobin: 12.4 g/dL (ref 12.0–15.0)
MCH: 28.1 pg (ref 26.0–34.0)
MCHC: 32.5 g/dL (ref 30.0–36.0)
MCV: 86.6 fL (ref 78.0–100.0)
Platelets: 359 10*3/uL (ref 150–400)
RBC: 4.41 MIL/uL (ref 3.87–5.11)
RDW: 15.6 % — AB (ref 11.5–15.5)
WBC: 5.5 10*3/uL (ref 4.0–10.5)

## 2018-02-16 LAB — HEMOGLOBIN A1C
Hgb A1c MFr Bld: 5.2 % (ref 4.8–5.6)
Mean Plasma Glucose: 102.54 mg/dL

## 2018-02-16 LAB — HCG, SERUM, QUALITATIVE: Preg, Serum: NEGATIVE

## 2018-02-18 ENCOUNTER — Ambulatory Visit (HOSPITAL_COMMUNITY)
Admission: RE | Admit: 2018-02-18 | Discharge: 2018-02-18 | Disposition: A | Discharge status: Home / Self Care | Payer: Managed Care, Other (non HMO) | Source: Ambulatory Visit | Attending: Urology | Admitting: Urology

## 2018-02-18 ENCOUNTER — Encounter (HOSPITAL_COMMUNITY): Payer: Self-pay

## 2018-02-18 DIAGNOSIS — N200 Calculus of kidney: Secondary | ICD-10-CM | POA: Diagnosis not present

## 2018-02-18 HISTORY — PX: IR URETERAL STENT LEFT NEW ACCESS W/O SEP NEPHROSTOMY CATH: IMG6075

## 2018-02-18 LAB — CBC WITH DIFFERENTIAL/PLATELET
BASOS ABS: 0 10*3/uL (ref 0.0–0.1)
Basophils Relative: 1 %
Eosinophils Absolute: 0.3 10*3/uL (ref 0.0–0.7)
Eosinophils Relative: 5 %
HEMATOCRIT: 39.7 % (ref 36.0–46.0)
HEMOGLOBIN: 12.6 g/dL (ref 12.0–15.0)
Lymphocytes Relative: 29 %
Lymphs Abs: 1.8 10*3/uL (ref 0.7–4.0)
MCH: 28.1 pg (ref 26.0–34.0)
MCHC: 31.7 g/dL (ref 30.0–36.0)
MCV: 88.6 fL (ref 78.0–100.0)
Monocytes Absolute: 0.3 10*3/uL (ref 0.1–1.0)
Monocytes Relative: 6 %
NEUTROS ABS: 3.6 10*3/uL (ref 1.7–7.7)
NEUTROS PCT: 59 %
Platelets: 387 10*3/uL (ref 150–400)
RBC: 4.48 MIL/uL (ref 3.87–5.11)
RDW: 15.7 % — ABNORMAL HIGH (ref 11.5–15.5)
WBC: 6 10*3/uL (ref 4.0–10.5)

## 2018-02-18 LAB — GLUCOSE, CAPILLARY: GLUCOSE-CAPILLARY: 108 mg/dL — AB (ref 65–99)

## 2018-02-18 LAB — BASIC METABOLIC PANEL
ANION GAP: 9 (ref 5–15)
BUN: 12 mg/dL (ref 6–20)
CALCIUM: 9.1 mg/dL (ref 8.9–10.3)
CO2: 23 mmol/L (ref 22–32)
Chloride: 106 mmol/L (ref 101–111)
Creatinine, Ser: 1.12 mg/dL — ABNORMAL HIGH (ref 0.44–1.00)
GFR, EST NON AFRICAN AMERICAN: 60 mL/min — AB (ref >60)
Glucose, Bld: 102 mg/dL — ABNORMAL HIGH (ref 65–99)
Potassium: 4.3 mmol/L (ref 3.5–5.1)
SODIUM: 138 mmol/L (ref 135–145)

## 2018-02-18 LAB — PROTIME-INR
INR: 0.94
PROTHROMBIN TIME: 12.4 s (ref 11.4–15.2)

## 2018-02-18 MED ORDER — MIDAZOLAM HCL 2 MG/2ML IJ SOLN
INTRAMUSCULAR | Status: AC
  Filled 2018-02-18: qty 2

## 2018-02-18 MED ORDER — LIDOCAINE HCL 1 % IJ SOLN
INTRAMUSCULAR | Status: AC
  Filled 2018-02-18: qty 20

## 2018-02-18 MED ORDER — MIDAZOLAM HCL 2 MG/2ML IJ SOLN
INTRAMUSCULAR | Status: AC | PRN
  Administered 2018-02-18 (×6): 1 mg via INTRAVENOUS

## 2018-02-18 MED ORDER — FENTANYL CITRATE (PF) 100 MCG/2ML IJ SOLN
INTRAMUSCULAR | Status: AC
  Filled 2018-02-18: qty 2

## 2018-02-18 MED ORDER — LIDOCAINE HCL (PF) 1 % IJ SOLN
INTRAMUSCULAR | Status: AC | PRN
  Administered 2018-02-18: 20 mL
  Administered 2018-02-18: 10 mL

## 2018-02-18 MED ORDER — LACTATED RINGERS IV SOLN
INTRAVENOUS | Status: DC
  Administered 2018-02-18: 08:00:00 via INTRAVENOUS

## 2018-02-18 MED ORDER — CEFAZOLIN SODIUM-DEXTROSE 2-4 GM/100ML-% IV SOLN
INTRAVENOUS | Status: AC
  Administered 2018-02-18: 2 g via INTRAVENOUS
  Filled 2018-02-18: qty 100

## 2018-02-18 MED ORDER — FENTANYL CITRATE (PF) 100 MCG/2ML IJ SOLN
INTRAMUSCULAR | Status: AC | PRN
  Administered 2018-02-18 (×5): 50 ug via INTRAVENOUS

## 2018-02-18 MED ORDER — MIDAZOLAM HCL 2 MG/2ML IJ SOLN
INTRAMUSCULAR | Status: AC
  Filled 2018-02-18: qty 4

## 2018-02-18 MED ORDER — CEFAZOLIN SODIUM-DEXTROSE 2-4 GM/100ML-% IV SOLN
2.0000 g | INTRAVENOUS | Status: AC
  Administered 2018-02-18: 2 g via INTRAVENOUS

## 2018-02-18 MED ORDER — IOPAMIDOL (ISOVUE-300) INJECTION 61%
INTRAVENOUS | Status: AC
  Administered 2018-02-18: 15 mL
  Filled 2018-02-18: qty 50

## 2018-02-18 MED ORDER — HYDROCODONE-ACETAMINOPHEN 5-325 MG PO TABS
1.0000 | ORAL_TABLET | ORAL | Status: DC | PRN
  Administered 2018-02-18: 1 via ORAL
  Filled 2018-02-18: qty 1

## 2018-02-18 NOTE — Procedures (Signed)
Interventional Radiology Procedure Note  Procedure: Successful left PCN with placement of a 30F catheter through a posterior interpolar calyx, down the ureter and into the bladder.   Complications: None immediate  Estimated Blood Loss: None  Recommendations: - Bedrest x 2 hrs - DC home   Signed,  Sterling BigHeath K. McCullough, MD

## 2018-02-18 NOTE — Discharge Instructions (Signed)
Percutaneous Nephrostomy Home Guide Percutaneous nephrostomy is a procedure to insert a flexible tube into your kidney so that urine can leave your body. This procedure may be done if a medical condition prevents urine from leaving your kidney in the usual way. During the procedure, the nephrostomy tube is inserted in the right or left side of your lower back and is connected to an external drainage bag. After you have a nephrostomy tube placed, urine will collect in the drainage bag outside of your body. You will need to empty and change the drainage bag as needed. You will also need to take steps to care for the area where the nephrostomy tube was inserted (tube insertion site). How do I care for my nephrostomy tube?  Always keep your tubing, the leg bag, or the bedside drainage bag below the level of your kidney so that your urine drains freely.  Avoid activities that would cause bending or pulling of your tubing. Ask your health care provider what activities are safe for you.  When connecting your nephrostomy tube to a drainage bag, make sure that there are no kinks in the tubing and that your urine is draining freely. You may want to gently wrap an elastic bandage over the tubing. This will help keep the tubing in place and prevent it from kinking. Make sure there is no tension on the tubing so it does not become dislodged.  At night, you may want to connect your nephrostomy tube or the leg bag to a larger bedside drainage bag. How do I empty the drainage bag? Empty the leg bag or bedside drainage bag whenever it becomes ? full. Also empty it before you go to sleep. Most drainage bags have a drain at the bottom that allows urine to be emptied. Follow these basic steps: 1. Hold the drainage bag over a toilet or collection container. Use a measuring container if your health care provider told you to measure your urine. 2. Open the drain of the bag and allow the urine to drain out. 3. After all the  urine has drained from the drainage bag, close the drain fully. 4. Flush the urine down the toilet. If a collection container was used, rinse the container.  How do I change the dressing around the nephrostomy tube? Change your dressing and clean your tube exit site as told by your health care provider. You may need to change the dressing every day for the first 2 weeks after having a nephrostomy tube inserted. After the first 2 weeks, you may be told to change the dressing two times a week. Supplies needed:  Mild soap and water.  Split gauze pads, 4  4 inches (10 x 10 cm).  Gauze pads, 4  4 inches (10 x 10 cm).  Paper tape. How to change the dressing: Because of the location of your nephrostomy tube, you may need help from another person to complete dressing changes. Follow these basic steps: 1. Wash hands with soap and water. 2. Gently remove the tape and dressing from around the nephrostomy tube. Be careful not to pull on the tube while removing the dressing. Avoid using scissors because they may damage the tube. 3. Wash the skin around the tube with mild soap and water, rinse well, and pat the skin dry with a clean cloth. 4. Check the skin around the drain for redness, swelling, pus, warmth, or a bad smell. 5. If the drain was sutured to the skin, check the suture to  verify that it is still anchored in the skin. 6. Place two split gauze pads in and around the tube exit site. Do not apply ointments or alcohol to the site. 7. Place a gauze pad on top of the split gauze pad. 8. Coil the tube on top of the gauze. The tubing should rest on the gauze, not on the skin. 9. Place tape around each edge of the gauze pad. 10. Secure the nephrostomy tubing. Make sure that the tube does not kink or become pinched. The tubing should rest on the gauze pad, not on the skin. 11. Dispose of used supplies properly.  How do I flush my nephrostomy tube? Use a saline syringe to rinse out (flush) your  nephrostomy tube as told by your health care provider. Flushing is easier if a three-way stopcock is placed between the tube and the drainage bag. One connection of the stopcock connects to your tube, the second connects to the drainage bag, and the third is usually covered with a cap. The lever on the stopcock points to the direction on the stopcock that is closed to flow. Normally, the lever points in the direction of the cap to allow urine to drain from the tube to the drainage bag. Supplies needed:  Rubbing alcohol wipe.  10 mL 0.9% saline syringe. How to flush the tube: 1. Move the lever of the three-way stopcock so it points toward the drainage bag. 2. Clean the cap with a rubbing alcohol wipe. 3. Screw the tip of a 10 mL 0.9% saline syringe onto the cap. 4. Using the syringe plunger, slowly push the 10 mL 0.9% saline in the syringe over 5-10 seconds. If resistance is met or pain occurs while pushing, stop pushing the saline. 5. Remove the syringe from the cap. 6. Return the stopcock lever to the usual position, pointing in the direction of the cap. 7. Dispose of used supplies properly. How do I replace the drainage bag? Replace the drainage bag, three-way stopcock, and any extension tubing as told by your health care provider. Make sure you always have an extra drainage bag and connecting tubing available. 1. Empty urine from your drainage bag. 2. Gather a new drainage bag, three-way stopcock, and any extension tubing. 3. Remove the drainage bag, three-way stopcock, and any extension tubing from the nephrostomy tube. 4. Attach the new leg bag or bedside drainage bag, three-way stopcock, and any extension tubing to the nephrostomy tube. 5. Dispose of the used drainage bag, three-way stopcock, and any extension.  Contact a health care provider if:  You have problems with any of the valves or tubing.  You have persistent pain or soreness in your back.  You have more redness, swelling,  or pain around your tube insertion site.  You have more fluid or blood coming from your tube insertion site.  Your tube insertion site feels warm to the touch.  You have pus or a bad smell coming from your tube insertion site.  You have increased urine output or you feel burning when urinating. Get help right away if:  You have pain in your abdomen during the first week.  You have chest pain or have trouble breathing.  You have a new appearance of blood in your urine.  You have a fever or chills.  You have back pain that is not relieved by your medicine.  You have decreased urine output.  Your nephrostomy tube comes out. This information is not intended to replace advice given to  you by your health care provider. Make sure you discuss any questions you have with your health care provider. °Document Released: 09/07/2013 Document Revised: 08/29/2016 Document Reviewed: 08/29/2016 °Elsevier Interactive Patient Education © 2018 [employer] ° ° °Moderate Conscious Sedation, Adult, Care After °These instructions provide you with information about caring for yourself after your procedure. Your health care provider may also give you more specific instructions. Your treatment has been planned according to current medical practices, but problems sometimes occur. Call your health care provider if you have any problems or questions after your procedure. °What can I expect after the procedure? °After your procedure, it is common: °· To feel sleepy for several hours. °· To feel clumsy and have poor balance for several hours. °· To have poor judgment for several hours. °· To vomit if you eat too soon. ° °Follow these instructions at home: °For at least 24 hours after the procedure: ° °· Do not: °? Participate in activities where you could fall or become injured. °? Drive. °? Use heavy machinery. °? Drink alcohol. °? Take sleeping pills or medicines that cause drowsiness. °? Make important decisions or sign  legal documents. °? Take care of children on your own. °· Rest. °Eating and drinking °· Follow the diet recommended by your health care provider. °· If you vomit: °? Drink water, juice, or soup when you can drink without vomiting. °? Make sure you have little or no nausea before eating solid foods. °General instructions °· Have a responsible adult stay with you until you are awake and alert. °· Take over-the-counter and prescription medicines only as told by your health care provider. °· If you smoke, do not smoke without supervision. °· Keep all follow-up visits as told by your health care provider. This is important. °Contact a health care provider if: °· You keep feeling nauseous or you keep vomiting. °· You feel light-headed. °· You develop a rash. °· You have a fever. °Get help right away if: °· You have trouble breathing. °This information is not intended to replace advice given to you by your health care provider. Make sure you discuss any questions you have with your health care provider. °Document Released: 09/07/2013 Document Revised: 04/21/2016 Document Reviewed: 03/08/2016 °Elsevier Interactive Patient Education © 2018 Elsevier Inc. ° °

## 2018-02-18 NOTE — Consult Note (Signed)
Chief Complaint: Patient was seen in consultation today for left percutaneous nephrostomy/nephroureteral catheter placement  Referring Physician(s): McKenzie,Patrick L  Supervising Physician: Malachy MoanMcCullough, Heath  [facility]: [facility]  History of Present Illness: Teresa SaaBeverly Hatcher is a 42 y.o. female with history of left nephrolithiasis, status post left double-J and nephrolithotomy with nephrostomy tube placement by urology on 12/07/17 secondary to staghorn calculus.  She is also status post laser lithotripsy as well as stent exchange on 01/21/18.  She has residual stone disease in the left kidney and presents today for left percutaneous nephrostomy/nephroureteral catheter placement prior to repeat nephrolithotomy on 3/22.  Past Medical History:  Diagnosis Date  . Anxiety   . History of kidney stones   . Kidney stones    Staghorn  . PONV (postoperative nausea and vomiting)    NOV 2018, JAN TREMORS LASTED ABOUT 15 MINUTES, LIKES SCOPOLOMINE PATCH; no issues wuth last kidney stone surgery   . Pre-diabetes     Past Surgical History:  Procedure Laterality Date  . CYSTOSCOPY  10/27/2017   Procedure: CYSTOSCOPY FLEXIBLE;  Surgeon: Malen GauzeMcKenzie, Patrick L, MD;  Location: [facility];  Service: Urology;;  . Bluford KaufmannYSTOSCOPY WITH RETROGRADE PYELOGRAM, URETEROSCOPY AND STENT PLACEMENT Left 01/21/2018   Procedure: CYSTOSCOPY WITH RETROGRADE PYELOGRAM, URETEROSCOPY AND STENT PLACEMENT;  Surgeon: Malen GauzeMcKenzie, Patrick L, MD;  Location: [facility];  Service: Urology;  Laterality: Left;  . HOLMIUM LASER APPLICATION Left 01/21/2018   Procedure: HOLMIUM LASER APPLICATION;  Surgeon: Malen GauzeMcKenzie, Patrick L, MD;  Location: [facility];  Service: Urology;  Laterality: Left;  . NEPHROLITHOTOMY Left 12/07/2017   Procedure: LEFT NEPHROLITHOTOMY PERCUTANEOUS AND STENT PLACEMENT;  Surgeon: Malen GauzeMcKenzie, Patrick L, MD;  Location: [facility];  Service: Urology;  Laterality: Left;  . NEPHROSTOMY Left 10/27/2017   Procedure: NEPHROSTOMY TUBE  PLACEMENT;  Surgeon: Malen GauzeMcKenzie, Patrick L, MD;  Location: [facility];  Service: Urology;  Laterality: Left;  . NO PAST SURGERIES      Allergies: Patient has no known allergies.  Medications: Prior to Admission medications   Medication Sig Start Date End Date Taking? Authorizing Provider  sulfamethoxazole-trimethoprim (BACTRIM DS,SEPTRA DS) 800-160 MG tablet Take 1 tablet by mouth daily. 01/21/18  Yes McKenzie, Mardene CelestePatrick L, MD  ibuprofen (ADVIL,MOTRIN) 200 MG tablet Take 600 mg by mouth 2 (two) times daily as needed for moderate pain.     [provider]  levonorgestrel (MIRENA) 20 MCG/24HR IUD 1 each once by Intrauterine route.    [provider]  ondansetron (ZOFRAN) 4 MG tablet Take 1 tablet (4 mg total) by mouth every 8 (eight) hours as needed for nausea or vomiting. 01/21/18   McKenzie, Mardene CelestePatrick L, MD  oxyCODONE-acetaminophen (PERCOCET/ROXICET) 5-325 MG tablet Take 1 tablet by mouth every 4 (four) hours as needed for moderate pain. 01/21/18   McKenzie, Mardene CelestePatrick L, MD     Family History  Problem Relation Age of Onset  . Cancer Paternal Grandfather        pancreatic  . Alcohol abuse Paternal Grandfather   . Arthritis Paternal Grandfather   . COPD Paternal Grandfather   . Early death Paternal Grandfather   . Cancer Paternal Grandmother        unknown, [place]  . Arthritis Paternal Grandmother   . COPD Paternal Grandmother   . Early death Paternal Grandmother   . Arthritis Father   . Mental retardation Father   . Arthritis Maternal Grandmother   . COPD Maternal Grandfather   . Heart disease Maternal Grandfather   . Cancer Sister [id number]  ovarian    Social History   Socioeconomic History  . Marital status: Married    Spouse name: Not on file  . Number of children: Not on file  . Years of education: Not on file  . Highest education level: Not on file  Occupational History  . Not on file  Social Needs  . Financial resource strain: Not on file  . Food insecurity:     Worry: Not on file    Inability: Not on file  . Transportation needs:    Medical: Not on file    Non-medical: Not on file  Tobacco Use  . Smoking status: Never Smoker  . Smokeless tobacco: Never Used  Substance and Sexual Activity  . Alcohol use: No  . Drug use: No  . Sexual activity: Yes    Birth control/protection: IUD    Comment: mirena 03/2013  Lifestyle  . Physical activity:    Days per week: Not on file    Minutes per session: Not on file  . Stress: Not on file  Relationships  . Social connections:    Talks on phone: Not on file    Gets together: Not on file    Attends religious service: Not on file    Active member of club or organization: Not on file    Attends meetings of clubs or organizations: Not on file    Relationship status: Not on file  Other Topics Concern  . Not on file  Social History Narrative   Married to [place]. 2 children Teresa Osborne in [place].   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   Drink caffeinated beverages, wears her seatbelt, wears a bike helmet.   Smoke detector in the home   Feels safe in her relationships      Review of Systems currently denies fever, headache, chest pain, dyspnea, cough, back pain, nausea, vomiting or bleeding.  She does have some intermittent left lower quadrant cramping.  Vital Signs: BP (!) 149/93 (BP Location: Right Arm)   Pulse 97   Temp 98.2 F (36.8 C) (Oral)   Resp 16   SpO2 99%   Physical Exam awake, alert.  Chest clear to auscultation bilaterally.  Heart with regular rate and rhythm.  Abdomen obese, soft, positive bowel sounds, nontender.  Extremities with full range of motion.  Imaging: Dg C-arm 1-60 Min-no Report  Result Date: 01/21/2018 Fluoroscopy was utilized by the requesting physician.  No radiographic interpretation.    Labs:  CBC: Recent Labs    12/08/17 0548 01/19/18 0824 02/16/18 0833 02/18/18 0758  WBC 13.6* 5.2 5.5 6.0  HGB 10.4* 12.4 12.4 12.6  HCT 32.3* 39.0  38.2 39.7  PLT 389 327 359 387    COAGS: Recent Labs    02/18/18 0758  INR 0.94    BMP: Recent Labs    12/08/17 0548 01/19/18 0824 02/16/18 0833 02/18/18 0758  NA 133* 138 138 138  K 4.4 4.6 4.8 4.3  CL 104 108 105 106  CO2 24 22 24 23   GLUCOSE 148* 94 94 102*  BUN 10 14 16 12   CALCIUM 8.5* 8.9 9.1 9.1  CREATININE 1.19* 1.22* 1.22* 1.12*  GFRNONAA 56* 54* 54* 60*  GFRAA >60 >60 >60 >60    LIVER FUNCTION TESTS: Recent Labs    06/10/17 1015  BILITOT 0.3  AST 12  ALT 10  ALKPHOS 72  PROT 6.9  ALBUMIN 3.9    TUMOR MARKERS: No results for input(s): AFPTM, CEA, CA199, CHROMGRNA in  the last 8760 hours.  Assessment and Plan: 42 y.o. female with history of left nephrolithiasis, status post left double-J and nephrolithotomy with nephrostomy tube placement by urology on 12/07/17 secondary to staghorn calculus.  She is also status post laser lithotripsy as well as stent exchange on 01/21/18.  She has residual stone disease in the left kidney and presents today for left percutaneous nephrostomy/nephroureteral catheter placement prior to repeat nephrolithotomy on 3/22. Risks and benefits of procedure were discussed with the patient/spouse including, but not limited to, infection, bleeding, significant bleeding causing loss or decrease in renal function or damage to adjacent structures.  Creatinine today 1.12  All of the patient's questions were answered, patient is agreeable to proceed.  Consent signed and in chart.      Thank you for this interesting consult.  I greatly enjoyed meeting Jyllian Haynie and look forward to participating in their care.  A copy of this report was sent to the requesting provider on this date.  Electronically Signed: D. Jeananne Rama, PA-C 02/18/2018, 9:01 AM   I spent a total of 25 minutes    in face to face in clinical consultation, greater than 50% of which was counseling/coordinating care for left percutaneous nephrostomy/nephroureteral  catheter placement

## 2018-02-19 ENCOUNTER — Encounter (HOSPITAL_COMMUNITY)
Admission: RE | Disposition: A | Discharge status: Home / Self Care | Payer: Self-pay | Source: Ambulatory Visit | Attending: Urology

## 2018-02-19 ENCOUNTER — Encounter (HOSPITAL_COMMUNITY): Payer: Self-pay | Admitting: *Deleted

## 2018-02-19 ENCOUNTER — Observation Stay (HOSPITAL_COMMUNITY)
Admission: RE | Admit: 2018-02-19 | Discharge: 2018-02-20 | Disposition: A | Discharge status: Home / Self Care | Payer: Managed Care, Other (non HMO) | Source: Ambulatory Visit | Attending: Urology | Admitting: Urology

## 2018-02-19 ENCOUNTER — Ambulatory Visit (HOSPITAL_COMMUNITY): Payer: Managed Care, Other (non HMO)

## 2018-02-19 ENCOUNTER — Ambulatory Visit (HOSPITAL_COMMUNITY): Payer: Managed Care, Other (non HMO) | Admitting: Certified Registered Nurse Anesthetist

## 2018-02-19 ENCOUNTER — Other Ambulatory Visit: Payer: Self-pay

## 2018-02-19 DIAGNOSIS — R7303 Prediabetes: Secondary | ICD-10-CM | POA: Insufficient documentation

## 2018-02-19 DIAGNOSIS — Z87442 Personal history of urinary calculi: Secondary | ICD-10-CM

## 2018-02-19 DIAGNOSIS — Z6841 Body Mass Index (BMI) 40.0 and over, adult: Secondary | ICD-10-CM | POA: Diagnosis not present

## 2018-02-19 DIAGNOSIS — F419 Anxiety disorder, unspecified: Secondary | ICD-10-CM | POA: Diagnosis not present

## 2018-02-19 DIAGNOSIS — N200 Calculus of kidney: Secondary | ICD-10-CM | POA: Diagnosis not present

## 2018-02-19 DIAGNOSIS — Z79899 Other long term (current) drug therapy: Secondary | ICD-10-CM | POA: Diagnosis not present

## 2018-02-19 HISTORY — PX: NEPHROLITHOTOMY: SHX5134

## 2018-02-19 LAB — BASIC METABOLIC PANEL
ANION GAP: 8 (ref 5–15)
BUN: 10 mg/dL (ref 6–20)
CALCIUM: 8.8 mg/dL — AB (ref 8.9–10.3)
CO2: 25 mmol/L (ref 22–32)
Chloride: 103 mmol/L (ref 101–111)
Creatinine, Ser: 1.01 mg/dL — ABNORMAL HIGH (ref 0.44–1.00)
Glucose, Bld: 131 mg/dL — ABNORMAL HIGH (ref 65–99)
POTASSIUM: 4.3 mmol/L (ref 3.5–5.1)
SODIUM: 136 mmol/L (ref 135–145)

## 2018-02-19 LAB — CBC
HCT: 36.3 % (ref 36.0–46.0)
Hemoglobin: 11.8 g/dL — ABNORMAL LOW (ref 12.0–15.0)
MCH: 28.2 pg (ref 26.0–34.0)
MCHC: 32.5 g/dL (ref 30.0–36.0)
MCV: 86.8 fL (ref 78.0–100.0)
PLATELETS: 299 10*3/uL (ref 150–400)
RBC: 4.18 MIL/uL (ref 3.87–5.11)
RDW: 15.4 % (ref 11.5–15.5)
WBC: 9.1 10*3/uL (ref 4.0–10.5)

## 2018-02-19 SURGERY — NEPHROLITHOTOMY PERCUTANEOUS
Anesthesia: General | Laterality: Left

## 2018-02-19 MED ORDER — SUGAMMADEX SODIUM 500 MG/5ML IV SOLN
INTRAVENOUS | Status: AC
  Filled 2018-02-19: qty 5

## 2018-02-19 MED ORDER — FENTANYL CITRATE (PF) 100 MCG/2ML IJ SOLN
INTRAMUSCULAR | Status: DC | PRN
  Administered 2018-02-19 (×2): 100 ug via INTRAVENOUS
  Administered 2018-02-19 (×4): 50 ug via INTRAVENOUS

## 2018-02-19 MED ORDER — OXYCODONE-ACETAMINOPHEN 5-325 MG PO TABS
1.0000 | ORAL_TABLET | ORAL | Status: DC | PRN
  Administered 2018-02-20 (×2): 2 via ORAL
  Filled 2018-02-19 (×2): qty 2

## 2018-02-19 MED ORDER — HYDROMORPHONE HCL 1 MG/ML IJ SOLN: 0.2500 mg | INTRAMUSCULAR | Status: DC | PRN

## 2018-02-19 MED ORDER — SUCCINYLCHOLINE CHLORIDE 200 MG/10ML IV SOSY
PREFILLED_SYRINGE | INTRAVENOUS | Status: AC
  Filled 2018-02-19: qty 10

## 2018-02-19 MED ORDER — PROPOFOL 10 MG/ML IV BOLUS
INTRAVENOUS | Status: DC | PRN
  Administered 2018-02-19: 200 mg via INTRAVENOUS

## 2018-02-19 MED ORDER — HYOSCYAMINE SULFATE 0.125 MG SL SUBL
0.1250 mg | SUBLINGUAL_TABLET | SUBLINGUAL | Status: DC | PRN
  Filled 2018-02-19: qty 1

## 2018-02-19 MED ORDER — FENTANYL CITRATE (PF) 100 MCG/2ML IJ SOLN
INTRAMUSCULAR | Status: AC
  Filled 2018-02-19: qty 2

## 2018-02-19 MED ORDER — SCOPOLAMINE 1 MG/3DAYS TD PT72
1.0000 | MEDICATED_PATCH | TRANSDERMAL | Status: DC
  Administered 2018-02-19: 1.5 mg via TRANSDERMAL
  Filled 2018-02-19: qty 1

## 2018-02-19 MED ORDER — SODIUM CHLORIDE 0.9 % IR SOLN
Status: DC | PRN
  Administered 2018-02-19: 1000 mL

## 2018-02-19 MED ORDER — LACTATED RINGERS IV SOLN
INTRAVENOUS | Status: DC
  Administered 2018-02-19 (×2): via INTRAVENOUS

## 2018-02-19 MED ORDER — DIPHENHYDRAMINE HCL 12.5 MG/5ML PO ELIX: 12.5000 mg | ORAL_SOLUTION | Freq: Four times a day (QID) | ORAL | Status: DC | PRN

## 2018-02-19 MED ORDER — SODIUM CHLORIDE 0.9 % IR SOLN
Status: DC | PRN
  Administered 2018-02-19: 15000 mL

## 2018-02-19 MED ORDER — DIPHENHYDRAMINE HCL 50 MG/ML IJ SOLN: 12.5000 mg | Freq: Four times a day (QID) | INTRAMUSCULAR | Status: DC | PRN

## 2018-02-19 MED ORDER — DEXAMETHASONE SODIUM PHOSPHATE 4 MG/ML IJ SOLN
INTRAMUSCULAR | Status: DC | PRN
  Administered 2018-02-19: 10 mg via INTRAVENOUS

## 2018-02-19 MED ORDER — ONDANSETRON HCL 4 MG/2ML IJ SOLN: 4.0000 mg | INTRAMUSCULAR | Status: DC | PRN

## 2018-02-19 MED ORDER — SODIUM CHLORIDE 0.9 % IV SOLN
INTRAVENOUS | Status: DC
  Administered 2018-02-19: 23:00:00 via INTRAVENOUS

## 2018-02-19 MED ORDER — ONDANSETRON HCL 4 MG/2ML IJ SOLN
4.0000 mg | Freq: Once | INTRAMUSCULAR | Status: AC
  Administered 2018-02-19: 4 mg via INTRAVENOUS
  Filled 2018-02-19: qty 2

## 2018-02-19 MED ORDER — ROCURONIUM BROMIDE 10 MG/ML (PF) SYRINGE
PREFILLED_SYRINGE | INTRAVENOUS | Status: AC
  Filled 2018-02-19: qty 5

## 2018-02-19 MED ORDER — SODIUM CHLORIDE 0.9 % IV SOLN
2.0000 g | INTRAVENOUS | Status: DC
  Filled 2018-02-19: qty 20

## 2018-02-19 MED ORDER — SUCCINYLCHOLINE CHLORIDE 200 MG/10ML IV SOSY
PREFILLED_SYRINGE | INTRAVENOUS | Status: DC | PRN
  Administered 2018-02-19: 10 mg via INTRAVENOUS

## 2018-02-19 MED ORDER — HYDROMORPHONE HCL 1 MG/ML IJ SOLN
0.5000 mg | INTRAMUSCULAR | Status: DC | PRN
  Administered 2018-02-19 (×2): 1 mg via INTRAVENOUS
  Filled 2018-02-19 (×4): qty 1

## 2018-02-19 MED ORDER — LIDOCAINE 2% (20 MG/ML) 5 ML SYRINGE
INTRAMUSCULAR | Status: DC | PRN
  Administered 2018-02-19: 1.5 mg/kg/h via INTRAVENOUS

## 2018-02-19 MED ORDER — PROPOFOL 10 MG/ML IV BOLUS
INTRAVENOUS | Status: AC
  Filled 2018-02-19: qty 40

## 2018-02-19 MED ORDER — ONDANSETRON HCL 4 MG/2ML IJ SOLN
INTRAMUSCULAR | Status: DC | PRN
  Administered 2018-02-19: 4 mg via INTRAVENOUS

## 2018-02-19 MED ORDER — ONDANSETRON HCL 4 MG/2ML IJ SOLN
INTRAMUSCULAR | Status: AC
  Filled 2018-02-19: qty 2

## 2018-02-19 MED ORDER — DEXAMETHASONE SODIUM PHOSPHATE 10 MG/ML IJ SOLN
INTRAMUSCULAR | Status: AC
  Filled 2018-02-19: qty 1

## 2018-02-19 MED ORDER — LIDOCAINE 2% (20 MG/ML) 5 ML SYRINGE
INTRAMUSCULAR | Status: DC | PRN
  Administered 2018-02-19: 80 mg via INTRAVENOUS

## 2018-02-19 MED ORDER — ZOLPIDEM TARTRATE 5 MG PO TABS: 5.0000 mg | ORAL_TABLET | Freq: Every evening | ORAL | Status: DC | PRN

## 2018-02-19 MED ORDER — GLYCOPYRROLATE 0.2 MG/ML IV SOSY
PREFILLED_SYRINGE | INTRAVENOUS | Status: AC
  Filled 2018-02-19: qty 5

## 2018-02-19 MED ORDER — FENTANYL CITRATE (PF) 100 MCG/2ML IJ SOLN
INTRAMUSCULAR | Status: AC
Start: 2018-02-19 — End: 2018-02-19
  Filled 2018-02-19: qty 2

## 2018-02-19 MED ORDER — LIDOCAINE 2% (20 MG/ML) 5 ML SYRINGE
INTRAMUSCULAR | Status: AC
  Filled 2018-02-19: qty 15

## 2018-02-19 MED ORDER — LIDOCAINE 2% (20 MG/ML) 5 ML SYRINGE
INTRAMUSCULAR | Status: AC
  Filled 2018-02-19: qty 5

## 2018-02-19 MED ORDER — IOHEXOL 300 MG/ML  SOLN
INTRAMUSCULAR | Status: DC | PRN
  Administered 2018-02-19: 81 mL

## 2018-02-19 MED ORDER — ROCURONIUM BROMIDE 10 MG/ML (PF) SYRINGE
PREFILLED_SYRINGE | INTRAVENOUS | Status: DC | PRN
  Administered 2018-02-19: 20 mg via INTRAVENOUS
  Administered 2018-02-19: 60 mg via INTRAVENOUS
  Administered 2018-02-19: 80 mg via INTRAVENOUS

## 2018-02-19 MED ORDER — SODIUM CHLORIDE 0.9 % IV SOLN
2.0000 g | INTRAVENOUS | Status: AC
  Administered 2018-02-19 (×2): 2 g via INTRAVENOUS
  Filled 2018-02-19: qty 20

## 2018-02-19 MED ORDER — SUGAMMADEX SODIUM 200 MG/2ML IV SOLN
INTRAVENOUS | Status: DC | PRN
  Administered 2018-02-19: 500 mg via INTRAVENOUS

## 2018-02-19 MED ORDER — MIDAZOLAM HCL 2 MG/2ML IJ SOLN
INTRAMUSCULAR | Status: AC
  Filled 2018-02-19: qty 2

## 2018-02-19 MED ORDER — MIDAZOLAM HCL 5 MG/5ML IJ SOLN
INTRAMUSCULAR | Status: DC | PRN
  Administered 2018-02-19: 2 mg via INTRAVENOUS

## 2018-02-19 SURGICAL SUPPLY — 70 items
APL SKNCLS STERI-STRIP NONHPOA (GAUZE/BANDAGES/DRESSINGS) ×1
BAG URINE DRAINAGE (UROLOGICAL SUPPLIES) IMPLANT
BASKET STONE NITINOL 3FRX115MB (UROLOGICAL SUPPLIES) IMPLANT
BASKET ZERO TIP NITINOL 2.4FR (BASKET) ×1 IMPLANT
BENZOIN TINCTURE PRP APPL 2/3 (GAUZE/BANDAGES/DRESSINGS) ×2 IMPLANT
BLADE SURG 15 STRL LF DISP TIS (BLADE) ×1 IMPLANT
BLADE SURG 15 STRL SS (BLADE) ×2
BSKT STON RTRVL ZERO TP 2.4FR (BASKET) ×1
CATCHER STONE W/TUBE ADAPTER (UROLOGICAL SUPPLIES) IMPLANT
CATH DILATION NEPHR BALL 15X10 (CATHETERS) ×1 IMPLANT
CATH FOLEY 2W COUNCIL 20FR 5CC (CATHETERS) IMPLANT
CATH FOLEY 2W COUNCIL 5CC 18FR (CATHETERS) ×1 IMPLANT
CATH FOLEY 2WAY SLVR  5CC 16FR (CATHETERS) ×1
CATH FOLEY 2WAY SLVR  5CC 18FR (CATHETERS)
CATH FOLEY 2WAY SLVR 5CC 16FR (CATHETERS) ×1 IMPLANT
CATH FOLEY 2WAY SLVR 5CC 18FR (CATHETERS) ×1 IMPLANT
CATH IMAGER II 65CM (CATHETERS) ×3 IMPLANT
CATH INTERMIT  6FR 70CM (CATHETERS) ×1 IMPLANT
CATH ROBINSON RED A/P 20FR (CATHETERS) IMPLANT
CATH URET DUAL LUMEN 6-10FR 50 (CATHETERS) IMPLANT
CATH X-FORCE N30 NEPHROSTOMY (TUBING) ×2 IMPLANT
COVER SURGICAL LIGHT HANDLE (MISCELLANEOUS) ×1 IMPLANT
DRAPE C-ARM 42X120 X-RAY (DRAPES) ×2 IMPLANT
DRAPE LINGEMAN PERC (DRAPES) ×2 IMPLANT
DRAPE SHEET LG 3/4 BI-LAMINATE (DRAPES) ×1 IMPLANT
DRAPE SURG IRRIG POUCH 19X23 (DRAPES) ×2 IMPLANT
DRSG PAD ABDOMINAL 8X10 ST (GAUZE/BANDAGES/DRESSINGS) ×4 IMPLANT
DRSG TEGADERM 8X12 (GAUZE/BANDAGES/DRESSINGS) ×4 IMPLANT
FIBER LASER FLEXIVA 1000 (UROLOGICAL SUPPLIES) IMPLANT
FIBER LASER FLEXIVA 365 (UROLOGICAL SUPPLIES) IMPLANT
FIBER LASER FLEXIVA 550 (UROLOGICAL SUPPLIES) IMPLANT
FIBER LASER TRAC TIP (UROLOGICAL SUPPLIES) IMPLANT
GAUZE SPONGE 4X4 12PLY STRL (GAUZE/BANDAGES/DRESSINGS) ×2 IMPLANT
GLOVE BIO SURGEON STRL SZ8 (GLOVE) ×4 IMPLANT
GLOVE BIOGEL PI IND STRL 8 (GLOVE) IMPLANT
GLOVE BIOGEL PI INDICATOR 8 (GLOVE)
GOWN STRL REUS W/TWL LRG LVL3 (GOWN DISPOSABLE) ×4 IMPLANT
GOWN STRL REUS W/TWL XL LVL3 (GOWN DISPOSABLE) ×2 IMPLANT
GUIDEWIRE AMPLAZ .035X145 (WIRE) ×2 IMPLANT
GUIDEWIRE STR DUAL SENSOR (WIRE) ×3 IMPLANT
IV SET EXTENSION CATH 6 NF (IV SETS) ×1 IMPLANT
KIT BASIN OR (CUSTOM PROCEDURE TRAY) ×2 IMPLANT
MANIFOLD NEPTUNE II (INSTRUMENTS) ×2 IMPLANT
NDL TROCAR 18X15 ECHO (NEEDLE) IMPLANT
NDL TROCAR 18X20 (NEEDLE) IMPLANT
NEEDLE TROCAR 18X15 ECHO (NEEDLE) IMPLANT
NEEDLE TROCAR 18X20 (NEEDLE) ×2 IMPLANT
NS IRRIG 1000ML POUR BTL (IV SOLUTION) ×2 IMPLANT
PACK CYSTO (CUSTOM PROCEDURE TRAY) ×2 IMPLANT
PROBE LITHOCLAST ULTRA 3.8X403 (UROLOGICAL SUPPLIES) IMPLANT
PROBE PNEUMATIC 1.0MMX570MM (UROLOGICAL SUPPLIES) IMPLANT
SET AMPLATZ RENAL DILATOR (MISCELLANEOUS) IMPLANT
SET IRRIG Y TYPE TUR BLADDER L (SET/KITS/TRAYS/PACK) ×1 IMPLANT
SHEATH PEELAWAY SET 9 (SHEATH) ×2 IMPLANT
SHEATH X FORCE 10MMX22CM (SHEATH) ×1 IMPLANT
SOL PREP PROV IODINE SCRUB 4OZ (MISCELLANEOUS) ×4 IMPLANT
SPONGE LAP 4X18 X RAY DECT (DISPOSABLE) ×2 IMPLANT
STENT CONTOUR 6FRX26X.038 (STENTS) IMPLANT
STONE CATCHER W/TUBE ADAPTER (UROLOGICAL SUPPLIES) IMPLANT
SUT SILK 2 0 30  PSL (SUTURE) ×1
SUT SILK 2 0 30 PSL (SUTURE) ×1 IMPLANT
SUT VIC AB 3-0 SH 27 (SUTURE) ×2
SUT VIC AB 3-0 SH 27X BRD (SUTURE) IMPLANT
SYR 10ML LL (SYRINGE) ×1 IMPLANT
SYR 20CC LL (SYRINGE) ×2 IMPLANT
SYR 50ML LL SCALE MARK (SYRINGE) ×1 IMPLANT
TOWEL OR 17X26 10 PK STRL BLUE (TOWEL DISPOSABLE) ×2 IMPLANT
TOWEL OR NON WOVEN STRL DISP B (DISPOSABLE) ×1 IMPLANT
TUBING CONNECTING 10 (TUBING) ×3 IMPLANT
WATER STERILE IRR 1000ML POUR (IV SOLUTION) IMPLANT

## 2018-02-19 NOTE — H&P (View-Only) (Signed)
Urology Admission H&P  Chief Complaint: left flank pain  History of Present Illness: [name] is a 42yo with a hx of stuvite calculi who had a recurrence of her left staghorn calculus. She had a nephrostoym tube placed by IR and now presents for definitive management  Past Medical History:  Diagnosis Date  . Anxiety   . History of kidney stones   . Kidney stones    Staghorn  . PONV (postoperative nausea and vomiting)    NOV 2018, JAN TREMORS LASTED ABOUT 15 MINUTES, LIKES SCOPOLOMINE PATCH; no issues wuth last kidney stone surgery   . Pre-diabetes    Past Surgical History:  Procedure Laterality Date  . CYSTOSCOPY  10/27/2017   Procedure: CYSTOSCOPY FLEXIBLE;  Surgeon: Malen GauzeMcKenzie, Mckynzi Cammon L, MD;  Location: [facility];  Service: Urology;;  . Bluford KaufmannYSTOSCOPY WITH RETROGRADE PYELOGRAM, URETEROSCOPY AND STENT PLACEMENT Left 01/21/2018   Procedure: CYSTOSCOPY WITH RETROGRADE PYELOGRAM, URETEROSCOPY AND STENT PLACEMENT;  Surgeon: Malen GauzeMcKenzie, Roberta Kelly L, MD;  Location: [facility];  Service: Urology;  Laterality: Left;  . HOLMIUM LASER APPLICATION Left 01/21/2018   Procedure: HOLMIUM LASER APPLICATION;  Surgeon: Malen GauzeMcKenzie, Brittley Regner L, MD;  Location: [facility];  Service: Urology;  Laterality: Left;  . IR URETERAL STENT LEFT NEW ACCESS W/O SEP NEPHROSTOMY CATH  02/18/2018  . NEPHROLITHOTOMY Left 12/07/2017   Procedure: LEFT NEPHROLITHOTOMY PERCUTANEOUS AND STENT PLACEMENT;  Surgeon: Malen GauzeMcKenzie, Alfretta Pinch L, MD;  Location: [facility];  Service: Urology;  Laterality: Left;  . NEPHROSTOMY Left 10/27/2017   Procedure: NEPHROSTOMY TUBE PLACEMENT;  Surgeon: Malen GauzeMcKenzie, Marvell Tamer L, MD;  Location: [facility];  Service: Urology;  Laterality: Left;  . NO PAST SURGERIES      Home Medications:  Current Facility-Administered Medications  Medication Dose Route Frequency Provider Last Rate Last Dose  . lactated ringers infusion   Intravenous Continuous Gaynelle AduFitzgerald, William, MD 75 mL/hr at 02/19/18 0945    . scopolamine (TRANSDERM-SCOP) 1 MG/3DAYS  1.5 mg  1 patch Transdermal Patton SallesQ72H Fitzgerald, William, MD   1.5 mg at 02/19/18 1006   Facility-Administered Medications Ordered in Other Encounters  Medication Dose Route Frequency Provider Last Rate Last Dose  . fentaNYL (SUBLIMAZE) injection    Anesthesia Intra-op Vanessa Durhamochran, Ronald Glenn, CRNA   50 mcg at 02/19/18 1050  . midazolam (VERSED) 5 MG/5ML injection    Anesthesia Intra-op Vanessa Durhamochran, Ronald Glenn, CRNA   2 mg at 02/19/18 1050   Allergies: No Known Allergies  Family History  Problem Relation Age of Onset  . Cancer Paternal Grandfather        pancreatic  . Alcohol abuse Paternal Grandfather   . Arthritis Paternal Grandfather   . COPD Paternal Grandfather   . Early death Paternal Grandfather   . Cancer Paternal Grandmother        unknown, metz  . Arthritis Paternal Grandmother   . COPD Paternal Grandmother   . Early death Paternal Grandmother   . Arthritis Father   . Mental retardation Father   . Arthritis Maternal Grandmother   . COPD Maternal Grandfather   . Heart disease Maternal Grandfather   . Cancer Sister [id number]       ovarian   Social History:  reports that she has never smoked. She has never used smokeless tobacco. She reports that she does not drink alcohol or use drugs.  Review of Systems  Genitourinary: Positive for dysuria, flank pain, frequency and urgency.  All other systems reviewed and are negative.   Physical Exam:  Vital signs in last 24 hours: Temp:  [  97.9 F (36.6 C)-98.5 F (36.9 C)] 98.1 F (36.7 C) (03/22 0927) Pulse Rate:  [71-108] 108 (03/22 0927) Resp:  [16-20] 20 (03/22 0927) BP: (112-163)/(51-94) 163/94 (03/22 0927) SpO2:  [94 %-100 %] 99 % (03/22 0927) Weight:  [185.5 kg (409 lb)] 185.5 kg (409 lb) (03/22 0934) Physical Exam  Constitutional: She is oriented to person, place, and time. She appears well-developed and well-nourished.  HENT:  Head: Normocephalic and atraumatic.  Eyes: Pupils are equal, round, and reactive to light. EOM are  normal.  Neck: Normal range of motion. No thyromegaly present.  Cardiovascular: Normal rate and regular rhythm.  Respiratory: No respiratory distress.  GI: Soft. She exhibits no distension.  Musculoskeletal: Normal range of motion. She exhibits no edema.  Neurological: She is alert and oriented to person, place, and time.  Skin: Skin is warm and dry.  Psychiatric: She has a normal mood and affect. Her behavior is normal. Judgment and thought content normal.    Laboratory Data:  No results found for this or any previous visit (from the past 24 hour(s)). No results found for this or any previous visit (from the past 240 hour(s)). Creatinine: Recent Labs    02/16/18 0833 02/18/18 0758  CREATININE 1.22* 1.12*   Baseline Creatinine: 1.1  Impression/Assessment:  42yo with left staghorn calculus  Plan:  The risks/benefits/alternatives to left PCNL was explained to the patient and she understands and wishes to proceed with surgery  Wilkie Aye 02/19/2018, 10:53 AM

## 2018-02-19 NOTE — Anesthesia Procedure Notes (Signed)
Procedure Name: Intubation Date/Time: 02/19/2018 11:09 AM Performed by: Vanessa Durhamochran, Darnesha Diloreto Glenn, CRNA Pre-anesthesia Checklist: Patient identified, Emergency Drugs available, Suction available and Patient being monitored Patient Re-evaluated:Patient Re-evaluated prior to induction Oxygen Delivery Method: Circle system utilized Preoxygenation: Pre-oxygenation with 100% oxygen Induction Type: IV induction Ventilation: Mask ventilation without difficulty Laryngoscope Size: 2 and Miller Grade View: Grade I Tube type: Oral Tube size: 7.5 mm Number of attempts: 1 Airway Equipment and Method: Stylet Placement Confirmation: ETT inserted through vocal cords under direct vision,  positive ETCO2 and breath sounds checked- equal and bilateral Secured at: 23 cm Tube secured with: Tape Dental Injury: Teeth and Oropharynx as per pre-operative assessment

## 2018-02-19 NOTE — Progress Notes (Signed)
Patient complained of continued irritation at catheter site. Pt request cathter to be removed.

## 2018-02-19 NOTE — H&P (Signed)
Urology Admission H&P  Chief Complaint: left flank pain  History of Present Illness: [name] is a 42yo with a hx of stuvite calculi who had a recurrence of her left staghorn calculus. She had a nephrostoym tube placed by IR and now presents for definitive management  Past Medical History:  Diagnosis Date  . Anxiety   . History of kidney stones   . Kidney stones    Staghorn  . PONV (postoperative nausea and vomiting)    NOV 2018, JAN TREMORS LASTED ABOUT 15 MINUTES, LIKES SCOPOLOMINE PATCH; no issues wuth last kidney stone surgery   . Pre-diabetes    Past Surgical History:  Procedure Laterality Date  . CYSTOSCOPY  10/27/2017   Procedure: CYSTOSCOPY FLEXIBLE;  Surgeon: Malen GauzeMcKenzie, Jaymarion Trombly L, MD;  Location: [facility];  Service: Urology;;  . Bluford KaufmannYSTOSCOPY WITH RETROGRADE PYELOGRAM, URETEROSCOPY AND STENT PLACEMENT Left 01/21/2018   Procedure: CYSTOSCOPY WITH RETROGRADE PYELOGRAM, URETEROSCOPY AND STENT PLACEMENT;  Surgeon: Malen GauzeMcKenzie, Sonnie Pawloski L, MD;  Location: [facility];  Service: Urology;  Laterality: Left;  . HOLMIUM LASER APPLICATION Left 01/21/2018   Procedure: HOLMIUM LASER APPLICATION;  Surgeon: Malen GauzeMcKenzie, Trenden Hazelrigg L, MD;  Location: [facility];  Service: Urology;  Laterality: Left;  . IR URETERAL STENT LEFT NEW ACCESS W/O SEP NEPHROSTOMY CATH  02/18/2018  . NEPHROLITHOTOMY Left 12/07/2017   Procedure: LEFT NEPHROLITHOTOMY PERCUTANEOUS AND STENT PLACEMENT;  Surgeon: Malen GauzeMcKenzie, Torra Pala L, MD;  Location: [facility];  Service: Urology;  Laterality: Left;  . NEPHROSTOMY Left 10/27/2017   Procedure: NEPHROSTOMY TUBE PLACEMENT;  Surgeon: Malen GauzeMcKenzie, Kaylean Tupou L, MD;  Location: [facility];  Service: Urology;  Laterality: Left;  . NO PAST SURGERIES      Home Medications:  Current Facility-Administered Medications  Medication Dose Route Frequency Provider Last Rate Last Dose  . lactated ringers infusion   Intravenous Continuous Gaynelle AduFitzgerald, William, MD 75 mL/hr at 02/19/18 0945    . scopolamine (TRANSDERM-SCOP) 1 MG/3DAYS  1.5 mg  1 patch Transdermal Patton SallesQ72H Fitzgerald, William, MD   1.5 mg at 02/19/18 1006   Facility-Administered Medications Ordered in Other Encounters  Medication Dose Route Frequency Provider Last Rate Last Dose  . fentaNYL (SUBLIMAZE) injection    Anesthesia Intra-op Vanessa Durhamochran, Ronald Glenn, CRNA   50 mcg at 02/19/18 1050  . midazolam (VERSED) 5 MG/5ML injection    Anesthesia Intra-op Vanessa Durhamochran, Ronald Glenn, CRNA   2 mg at 02/19/18 1050   Allergies: No Known Allergies  Family History  Problem Relation Age of Onset  . Cancer Paternal Grandfather        pancreatic  . Alcohol abuse Paternal Grandfather   . Arthritis Paternal Grandfather   . COPD Paternal Grandfather   . Early death Paternal Grandfather   . Cancer Paternal Grandmother        unknown, metz  . Arthritis Paternal Grandmother   . COPD Paternal Grandmother   . Early death Paternal Grandmother   . Arthritis Father   . Mental retardation Father   . Arthritis Maternal Grandmother   . COPD Maternal Grandfather   . Heart disease Maternal Grandfather   . Cancer Sister [id number]       ovarian   Social History:  reports that she has never smoked. She has never used smokeless tobacco. She reports that she does not drink alcohol or use drugs.  Review of Systems  Genitourinary: Positive for dysuria, flank pain, frequency and urgency.  All other systems reviewed and are negative.   Physical Exam:  Vital signs in last 24 hours: Temp:  [  97.9 F (36.6 C)-98.5 F (36.9 C)] 98.1 F (36.7 C) (03/22 0927) Pulse Rate:  [71-108] 108 (03/22 0927) Resp:  [16-20] 20 (03/22 0927) BP: (112-163)/(51-94) 163/94 (03/22 0927) SpO2:  [94 %-100 %] 99 % (03/22 0927) Weight:  [185.5 kg (409 lb)] 185.5 kg (409 lb) (03/22 0934) Physical Exam  Constitutional: She is oriented to person, place, and time. She appears well-developed and well-nourished.  HENT:  Head: Normocephalic and atraumatic.  Eyes: Pupils are equal, round, and reactive to light. EOM are  normal.  Neck: Normal range of motion. No thyromegaly present.  Cardiovascular: Normal rate and regular rhythm.  Respiratory: No respiratory distress.  GI: Soft. She exhibits no distension.  Musculoskeletal: Normal range of motion. She exhibits no edema.  Neurological: She is alert and oriented to person, place, and time.  Skin: Skin is warm and dry.  Psychiatric: She has a normal mood and affect. Her behavior is normal. Judgment and thought content normal.    Laboratory Data:  No results found for this or any previous visit (from the past 24 hour(s)). No results found for this or any previous visit (from the past 240 hour(s)). Creatinine: Recent Labs    02/16/18 0833 02/18/18 0758  CREATININE 1.22* 1.12*   Baseline Creatinine: 1.1  Impression/Assessment:  42yo with left staghorn calculus  Plan:  The risks/benefits/alternatives to left PCNL was explained to the patient and she understands and wishes to proceed with surgery  Wilkie Aye 02/19/2018, 10:53 AM

## 2018-02-19 NOTE — Anesthesia Preprocedure Evaluation (Addendum)
Anesthesia Evaluation  Patient identified by MRN, date of birth, ID band Patient awake    Reviewed: Allergy & Precautions, H&P , NPO status , Patient's Chart, lab work & pertinent test results  History of Anesthesia Complications (+) PONV  Airway Mallampati: I  TM Distance: >3 FB Neck ROM: Full    Dental no notable dental hx. (+) Teeth Intact, Dental Advisory Given   Pulmonary neg pulmonary ROS,    Pulmonary exam normal breath sounds clear to auscultation       Cardiovascular negative cardio ROS   Rhythm:Regular Rate:Normal     Neuro/Psych Anxiety negative neurological ROS     GI/Hepatic negative GI ROS, Neg liver ROS,   Endo/Other  negative endocrine ROSMorbid obesity  Renal/GU Renal disease  negative genitourinary   Musculoskeletal   Abdominal   Peds  Hematology negative hematology ROS (+)   Anesthesia Other Findings   Reproductive/Obstetrics negative OB ROS                            Anesthesia Physical Anesthesia Plan  ASA: III  Anesthesia Plan: General   Post-op Pain Management:    Induction: Intravenous  PONV Risk Score and Plan: 4 or greater and Ondansetron, Dexamethasone, Scopolamine patch - Pre-op and Midazolam  Airway Management Planned: Oral ETT  Additional Equipment:   Intra-op Plan:   Post-operative Plan: Extubation in OR  Informed Consent: I have reviewed the patients History and Physical, chart, labs and discussed the procedure including the risks, benefits and alternatives for the proposed anesthesia with the patient or authorized representative who has indicated his/her understanding and acceptance.   Dental advisory given  Plan Discussed with: CRNA  Anesthesia Plan Comments:         Anesthesia Quick Evaluation

## 2018-02-19 NOTE — Op Note (Addendum)
Marland KitchenPreoperative diagnosis: Left renal stone  Postoperative diagnosis: Same  Procedure 1.  Left percutaneous nephrostolithotomy for stone greater than 2 cm 2.  Left nephrostogram 3.  Intraoperative fluoroscopy, under 1 hour, with interpretation 4. Percutaneous access into the left renal collecting system 5.  Placement of a 74 French nephrostomy tube 6.  Dilation of percutaneous tract  Attending: Dr. Ronne Binning  Anesthesia: General  Estimated blood loss: 100cc  Antibiotics: rocephin  Drains: 1.  16 French Foley catheter 2.  6 x 26 left double-J ureteral stent 3.  18 French nephrostomy tube  Specimens: Stone for analysis  Findings: complete staghorn calculus. Lower pole access. Nephrostomy tube placed by Interventional radiology could not be used due because the tract was too long and the access sheath would not reach the kidney  Indications: Patient is a 42 year old female with a history of large left renal stone.  After discussing treatment options and they decided to proceed with left percutaneous nephrostolithotomy.  She had a nephrostomy tube placed by radiology  Procedure in detail: Prior to procedure consent was obtained.  Patient was brought to the operating room and a timeout was done to ensure correct patient, correct procedure, and correct site.  General anesthesia was administered. A 16 French Foley catheter was then placed  The patient was then placed in the prone position.   The left flank was then prepped and draped in usual sterile fashion. We placed a wire through the nephrostomy tube and down to the bladder. We then removed the nephrostomy tube. We then placed a 10 french dilator over the wire and then into the collecting system. We then placed a second wire down the ureter and into the bladder. We then attempted to advanced the nephromax dilator and the tract was too long and the balloon did not reach the collecting system. We then elected to place another access.   Contrast  was instilled through aureteral catheter and the bullseye technique was used to gain access into the lower pole. Once we gained access a sensor wire was advanced through the spinal needle and down the ureter to the bladder. We then used an 8 french and 10 french dilater to dilate the tract. We then used a sheath to place a second wire down to the bladder.  We then made an incision at the level of the skin and over the wire we then placed a NephroMax dilator.  We dilated the nephrostomy tract to 30 [place] and held this 18 cm of water for 1 minute.  We then  placed the access sheath over the balloon.  The balloon was then deflated.  We then used a rigid nephroscope to perform nephroscopy.  We encountered a large lower pole stone. Using the LithoClast the stone was fragmented and the fragments were removed with graspers.  The stone fragments were sent for composition analysis.  We then removed the nephroscope and over the wire placed a 6 x 26 double-J ureteral stent.  The wire was then removed and good coil was noted in the renal pelvis under direct vision in the bladder under fluoroscopy.  We then placed a 18 French nephrostomy tube through the sheath into the renal pelvis.  The balloon was inflated with 3 mls of contrast.  We then removed the access sheath in and obtained another nephrostogram.  We noted minimal extravasation of contrast.  We then secured the nephrostomy tube with 0 silks in interrupted fashion.  Dressing was placed over the nephrostomy tube site and this  then concluded the procedure was well-tolerated by the patient.  Complications: None  Condition: Stable, extubated, transferred to PACU  Plan: Patient is to be admitted overnight for observation.  Foley catheter through the morning.  Pt is then to be discharged home and followup in 1 week for second look PCNL. Stent to be removed 1 week after nephrostomy tube

## 2018-02-19 NOTE — Anesthesia Postprocedure Evaluation (Signed)
Anesthesia Post Note  Patient: Teresa Osborne  Procedure(s) Performed: NEPHROLITHOTOMY PERCUTANEOUS (Left )     Patient location during evaluation: PACU Anesthesia Type: General Level of consciousness: awake and alert Pain management: pain level controlled Vital Signs Assessment: post-procedure vital signs reviewed and stable Respiratory status: spontaneous breathing, nonlabored ventilation, respiratory function stable and patient connected to nasal cannula oxygen Cardiovascular status: blood pressure returned to baseline and stable Postop Assessment: no apparent nausea or vomiting Anesthetic complications: no    Last Vitals:  Vitals:   02/19/18 1400 02/19/18 1415  BP: (!) 142/75 140/74  Pulse: (!) 108   Resp: (!) 33   Temp:    SpO2: 100%     Last Pain:  Vitals:   02/19/18 1415  TempSrc:   PainSc: 0-No pain                 Zahari Fazzino,W. EDMOND

## 2018-02-19 NOTE — Transfer of Care (Signed)
Immediate Anesthesia Transfer of Care Note  Patient: Teresa Osborne  Procedure(s) Performed: NEPHROLITHOTOMY PERCUTANEOUS (Left )  Patient Location: PACU  Anesthesia Type:General  Level of Consciousness: patient cooperative  Airway & Oxygen Therapy: Patient Spontanous Breathing and Patient connected to face mask  Post-op Assessment: Report given to RN and Post -op Vital signs reviewed and stable  Post vital signs: Reviewed and stable  Last Vitals:  Vitals Value Taken Time  BP 146/70 02/19/2018  1:49 PM  Temp    Pulse 107 02/19/2018  1:51 PM  Resp 29 02/19/2018  1:51 PM  SpO2 100 % 02/19/2018  1:51 PM  Vitals shown include unvalidated device data.  Last Pain:  Vitals:   02/19/18 0934  TempSrc:   PainSc: 8       Patients Stated Pain Goal: 4 (02/19/18 0934)  Complications: No apparent anesthesia complications

## 2018-02-20 ENCOUNTER — Encounter (HOSPITAL_COMMUNITY): Payer: Self-pay | Admitting: Urology

## 2018-02-20 DIAGNOSIS — N200 Calculus of kidney: Secondary | ICD-10-CM | POA: Diagnosis not present

## 2018-02-20 LAB — BASIC METABOLIC PANEL
Anion gap: 7 (ref 5–15)
BUN: 10 mg/dL (ref 6–20)
CO2: 25 mmol/L (ref 22–32)
Calcium: 8.2 mg/dL — ABNORMAL LOW (ref 8.9–10.3)
Chloride: 104 mmol/L (ref 101–111)
Creatinine, Ser: 1.01 mg/dL — ABNORMAL HIGH (ref 0.44–1.00)
GFR calc Af Amer: >60 mL/min (ref >60)
GFR calc non Af Amer: >60 mL/min (ref >60)
GLUCOSE: 137 mg/dL — AB (ref 65–99)
POTASSIUM: 4 mmol/L (ref 3.5–5.1)
Sodium: 136 mmol/L (ref 135–145)

## 2018-02-20 LAB — CBC
HCT: 33.4 % — ABNORMAL LOW (ref 36.0–46.0)
Hemoglobin: 10.6 g/dL — ABNORMAL LOW (ref 12.0–15.0)
MCH: 28.2 pg (ref 26.0–34.0)
MCHC: 31.7 g/dL (ref 30.0–36.0)
MCV: 88.8 fL (ref 78.0–100.0)
PLATELETS: 324 10*3/uL (ref 150–400)
RBC: 3.76 MIL/uL — ABNORMAL LOW (ref 3.87–5.11)
RDW: 15.7 % — ABNORMAL HIGH (ref 11.5–15.5)
WBC: 10.9 10*3/uL — ABNORMAL HIGH (ref 4.0–10.5)

## 2018-02-20 MED ORDER — OXYCODONE-ACETAMINOPHEN 5-325 MG PO TABS: 1.0000 | ORAL_TABLET | ORAL | 0 refills | Status: DC | PRN

## 2018-02-20 NOTE — Discharge Summary (Signed)
Date of admission: 02/19/2018  Date of discharge: 02/20/2018  Admission diagnosis: Left staghorn stone  Discharge diagnosis: Same    History and Physical: For full details, please see admission history and physical. Briefly, Teresa Osborne is a 42 y.o. year old patient s/p left PNCL on 02/19/18 with Dr. Ronne BinningMcKenzie for a complete staghorn stone.  Hospital Course: NAEO.  Pain controlled.  Denies N/V/F/C.  Voiding w/o difficulty.  Nephrostomy tube with slightly pink urine and with adequate output.  Laboratory values:  Recent Labs    02/18/18 0758 02/19/18 1416 02/20/18 0501  HGB 12.6 11.8* 10.6*  HCT 39.7 36.3 33.4*   Recent Labs    02/19/18 1416 02/20/18 0501  CREATININE 1.01* 1.01*    Disposition: Home with nephrostomy tube  Discharge instruction: The patient was instructed to be ambulatory but told to refrain from heavy lifting, strenuous activity, or driving.   Discharge medications:  Allergies as of 02/20/2018   No Known Allergies     Medication List    TAKE these medications   ibuprofen 200 MG tablet Commonly known as:  ADVIL,MOTRIN Take 600 mg by mouth 2 (two) times daily as needed for moderate pain.   levonorgestrel 20 MCG/24HR IUD Commonly known as:  MIRENA 1 each once by Intrauterine route.   ondansetron 4 MG tablet Commonly known as:  ZOFRAN Take 1 tablet (4 mg total) by mouth every 8 (eight) hours as needed for nausea or vomiting.   oxyCODONE-acetaminophen 5-325 MG tablet Commonly known as:  PERCOCET/ROXICET Take 1 tablet by mouth every 4 (four) hours as needed for moderate pain.   sulfamethoxazole-trimethoprim 800-160 MG tablet Commonly known as:  BACTRIM DS,SEPTRA DS Take 1 tablet by mouth daily.       Followup:  Repeat PCNL next week

## 2018-02-23 ENCOUNTER — Encounter (HOSPITAL_COMMUNITY): Payer: Self-pay | Admitting: *Deleted

## 2018-02-23 ENCOUNTER — Other Ambulatory Visit: Payer: Self-pay

## 2018-02-24 NOTE — Anesthesia Preprocedure Evaluation (Addendum)
Anesthesia Evaluation  Patient identified by MRN, date of birth, ID band Patient awake    Reviewed: Allergy & Precautions, H&P , NPO status , Patient's Chart, lab work & pertinent test results  History of Anesthesia Complications (+) PONV and history of anesthetic complications  Airway Mallampati: I  TM Distance: >3 FB Neck ROM: Full    Dental no notable dental hx. (+) Teeth Intact, Dental Advisory Given   Pulmonary neg pulmonary ROS,    Pulmonary exam normal breath sounds clear to auscultation       Cardiovascular negative cardio ROS Normal cardiovascular exam Rhythm:Regular Rate:Normal     Neuro/Psych Anxiety negative neurological ROS     GI/Hepatic negative GI ROS, Neg liver ROS,   Endo/Other  Morbid obesity  Renal/GU Renal disease  negative genitourinary   Musculoskeletal   Abdominal (+) + obese,   Peds  Hematology  (+) Blood dyscrasia, anemia ,   Anesthesia Other Findings   Reproductive/Obstetrics negative OB ROS                            Anesthesia Physical  Anesthesia Plan  ASA: III  Anesthesia Plan: General   Post-op Pain Management:    Induction: Intravenous  PONV Risk Score and Plan: 4 or greater and Ondansetron, Dexamethasone, Scopolamine patch - Pre-op and Midazolam  Airway Management Planned: Oral ETT  Additional Equipment:   Intra-op Plan:   Post-operative Plan: Extubation in OR  Informed Consent: I have reviewed the patients History and Physical, chart, labs and discussed the procedure including the risks, benefits and alternatives for the proposed anesthesia with the patient or authorized representative who has indicated his/her understanding and acceptance.   Dental advisory given  Plan Discussed with: CRNA and Surgeon  Anesthesia Plan Comments:        Anesthesia Quick Evaluation

## 2018-02-25 ENCOUNTER — Encounter (HOSPITAL_COMMUNITY)
Admission: RE | Disposition: A | Discharge status: Home / Self Care | Payer: Self-pay | Source: Ambulatory Visit | Attending: Urology

## 2018-02-25 ENCOUNTER — Ambulatory Visit (HOSPITAL_COMMUNITY): Payer: Managed Care, Other (non HMO)

## 2018-02-25 ENCOUNTER — Encounter (HOSPITAL_COMMUNITY): Payer: Self-pay

## 2018-02-25 ENCOUNTER — Ambulatory Visit (HOSPITAL_COMMUNITY): Payer: Managed Care, Other (non HMO) | Admitting: Anesthesiology

## 2018-02-25 ENCOUNTER — Ambulatory Visit (HOSPITAL_COMMUNITY)
Admission: RE | Admit: 2018-02-25 | Discharge: 2018-02-25 | Disposition: A | Discharge status: Home / Self Care | Payer: Managed Care, Other (non HMO) | Source: Ambulatory Visit | Attending: Urology | Admitting: Urology

## 2018-02-25 DIAGNOSIS — N200 Calculus of kidney: Secondary | ICD-10-CM | POA: Diagnosis present

## 2018-02-25 DIAGNOSIS — Z6841 Body Mass Index (BMI) 40.0 and over, adult: Secondary | ICD-10-CM | POA: Diagnosis not present

## 2018-02-25 HISTORY — PX: NEPHROLITHOTOMY: SHX5134

## 2018-02-25 SURGERY — NEPHROLITHOTOMY PERCUTANEOUS SECOND LOOK
Anesthesia: General | Laterality: Left

## 2018-02-25 MED ORDER — SULFAMETHOXAZOLE-TRIMETHOPRIM 800-160 MG PO TABS: 1.0000 | ORAL_TABLET | Freq: Every day | ORAL | 3 refills | Status: DC

## 2018-02-25 MED ORDER — ACETAMINOPHEN 325 MG PO TABS: 325.0000 mg | ORAL_TABLET | ORAL | Status: DC | PRN

## 2018-02-25 MED ORDER — SUGAMMADEX SODIUM 500 MG/5ML IV SOLN
INTRAVENOUS | Status: AC
  Filled 2018-02-25: qty 5

## 2018-02-25 MED ORDER — MIDAZOLAM HCL 2 MG/2ML IJ SOLN
INTRAMUSCULAR | Status: AC
  Filled 2018-02-25: qty 2

## 2018-02-25 MED ORDER — ONDANSETRON HCL 4 MG PO TABS: 4.0000 mg | ORAL_TABLET | Freq: Three times a day (TID) | ORAL | 0 refills | Status: DC | PRN

## 2018-02-25 MED ORDER — OXYCODONE HCL 5 MG PO TABS: 5.0000 mg | ORAL_TABLET | Freq: Once | ORAL | Status: DC | PRN

## 2018-02-25 MED ORDER — MEPERIDINE HCL 50 MG/ML IJ SOLN: 6.2500 mg | INTRAMUSCULAR | Status: DC | PRN

## 2018-02-25 MED ORDER — ONDANSETRON HCL 4 MG/2ML IJ SOLN
INTRAMUSCULAR | Status: DC | PRN
  Administered 2018-02-25: 4 mg via INTRAVENOUS

## 2018-02-25 MED ORDER — SCOPOLAMINE 1 MG/3DAYS TD PT72
MEDICATED_PATCH | TRANSDERMAL | Status: AC
  Filled 2018-02-25: qty 1

## 2018-02-25 MED ORDER — FENTANYL CITRATE (PF) 100 MCG/2ML IJ SOLN
INTRAMUSCULAR | Status: DC | PRN
  Administered 2018-02-25 (×2): 50 ug via INTRAVENOUS
  Administered 2018-02-25: 100 ug via INTRAVENOUS

## 2018-02-25 MED ORDER — ACETAMINOPHEN 160 MG/5ML PO SOLN: 325.0000 mg | ORAL | Status: DC | PRN

## 2018-02-25 MED ORDER — DEXAMETHASONE SODIUM PHOSPHATE 10 MG/ML IJ SOLN
INTRAMUSCULAR | Status: AC
  Filled 2018-02-25: qty 1

## 2018-02-25 MED ORDER — SCOPOLAMINE 1 MG/3DAYS TD PT72
MEDICATED_PATCH | TRANSDERMAL | Status: DC | PRN
  Administered 2018-02-25: 1 via TRANSDERMAL

## 2018-02-25 MED ORDER — DEXAMETHASONE SODIUM PHOSPHATE 10 MG/ML IJ SOLN
INTRAMUSCULAR | Status: DC | PRN
  Administered 2018-02-25: 10 mg via INTRAVENOUS

## 2018-02-25 MED ORDER — OXYCODONE HCL 5 MG/5ML PO SOLN
5.0000 mg | Freq: Once | ORAL | Status: DC | PRN
  Filled 2018-02-25: qty 5

## 2018-02-25 MED ORDER — PROPOFOL 10 MG/ML IV BOLUS
INTRAVENOUS | Status: DC | PRN
  Administered 2018-02-25: 200 mg via INTRAVENOUS

## 2018-02-25 MED ORDER — KETOROLAC TROMETHAMINE 30 MG/ML IJ SOLN: 30.0000 mg | Freq: Once | INTRAMUSCULAR | Status: DC | PRN

## 2018-02-25 MED ORDER — SODIUM CHLORIDE 0.9 % IV SOLN
2.0000 g | INTRAVENOUS | Status: AC
  Administered 2018-02-25: 2 g via INTRAVENOUS
  Filled 2018-02-25: qty 20

## 2018-02-25 MED ORDER — FENTANYL CITRATE (PF) 100 MCG/2ML IJ SOLN
INTRAMUSCULAR | Status: AC
  Filled 2018-02-25: qty 4

## 2018-02-25 MED ORDER — PROPOFOL 10 MG/ML IV BOLUS
INTRAVENOUS | Status: AC
  Filled 2018-02-25: qty 20

## 2018-02-25 MED ORDER — SUGAMMADEX SODIUM 500 MG/5ML IV SOLN
INTRAVENOUS | Status: DC | PRN
  Administered 2018-02-25: 400 mg via INTRAVENOUS

## 2018-02-25 MED ORDER — OXYCODONE-ACETAMINOPHEN 5-325 MG PO TABS: 1.0000 | ORAL_TABLET | ORAL | 0 refills | Status: DC | PRN

## 2018-02-25 MED ORDER — LIDOCAINE 2% (20 MG/ML) 5 ML SYRINGE
INTRAMUSCULAR | Status: AC
  Filled 2018-02-25: qty 5

## 2018-02-25 MED ORDER — LACTATED RINGERS IV SOLN
INTRAVENOUS | Status: DC
  Administered 2018-02-25: 06:00:00 via INTRAVENOUS

## 2018-02-25 MED ORDER — FENTANYL CITRATE (PF) 250 MCG/5ML IJ SOLN
INTRAMUSCULAR | Status: AC
  Filled 2018-02-25: qty 5

## 2018-02-25 MED ORDER — IOHEXOL 300 MG/ML  SOLN
INTRAMUSCULAR | Status: DC | PRN
  Administered 2018-02-25: 8 mL

## 2018-02-25 MED ORDER — FENTANYL CITRATE (PF) 100 MCG/2ML IJ SOLN
25.0000 ug | INTRAMUSCULAR | Status: DC | PRN
  Administered 2018-02-25 (×4): 50 ug via INTRAVENOUS

## 2018-02-25 MED ORDER — NYSTATIN 100000 UNIT/GM EX CREA
TOPICAL_CREAM | Freq: Two times a day (BID) | CUTANEOUS | Status: DC
  Filled 2018-02-25: qty 15

## 2018-02-25 MED ORDER — MIDAZOLAM HCL 5 MG/5ML IJ SOLN
INTRAMUSCULAR | Status: DC | PRN
  Administered 2018-02-25 (×2): 2 mg via INTRAVENOUS

## 2018-02-25 MED ORDER — LIDOCAINE HCL (CARDIAC) 20 MG/ML IV SOLN
INTRAVENOUS | Status: DC | PRN
  Administered 2018-02-25: 100 mg via INTRAVENOUS

## 2018-02-25 MED ORDER — ROCURONIUM BROMIDE 100 MG/10ML IV SOLN
INTRAVENOUS | Status: DC | PRN
  Administered 2018-02-25: 20 mg via INTRAVENOUS
  Administered 2018-02-25: 60 mg via INTRAVENOUS

## 2018-02-25 MED ORDER — ONDANSETRON HCL 4 MG/2ML IJ SOLN
INTRAMUSCULAR | Status: AC
  Filled 2018-02-25: qty 2

## 2018-02-25 MED ORDER — PROMETHAZINE HCL 25 MG/ML IJ SOLN: 6.2500 mg | INTRAMUSCULAR | Status: DC | PRN

## 2018-02-25 MED ORDER — ROCURONIUM BROMIDE 10 MG/ML (PF) SYRINGE
PREFILLED_SYRINGE | INTRAVENOUS | Status: AC
  Filled 2018-02-25: qty 5

## 2018-02-25 MED ORDER — SODIUM CHLORIDE 0.9 % IR SOLN
Status: DC | PRN
  Administered 2018-02-25: 1000 mL
  Administered 2018-02-25: 3000 mL

## 2018-02-25 SURGICAL SUPPLY — 66 items
APL SKNCLS STERI-STRIP NONHPOA (GAUZE/BANDAGES/DRESSINGS) ×1
BAG URINE DRAINAGE (UROLOGICAL SUPPLIES) ×3 IMPLANT
BASKET STONE NITINOL 3FRX115MB (UROLOGICAL SUPPLIES) IMPLANT
BASKET ZERO TIP NITINOL 2.4FR (BASKET) IMPLANT
BENZOIN TINCTURE PRP APPL 2/3 (GAUZE/BANDAGES/DRESSINGS) ×3 IMPLANT
BLADE SURG 15 STRL LF DISP TIS (BLADE) ×1 IMPLANT
BLADE SURG 15 STRL SS (BLADE) ×2
BSKT STON RTRVL ZERO TP 2.4FR (BASKET)
CATH FOLEY 2W COUNCIL 20FR 5CC (CATHETERS) IMPLANT
CATH FOLEY 2WAY SLVR  5CC 16FR (CATHETERS) ×1
CATH FOLEY 2WAY SLVR  5CC 18FR (CATHETERS)
CATH FOLEY 2WAY SLVR 5CC 16FR (CATHETERS) ×1 IMPLANT
CATH FOLEY 2WAY SLVR 5CC 18FR (CATHETERS) ×1 IMPLANT
CATH IMAGER II 65CM (CATHETERS) IMPLANT
CATH INTERMIT  6FR 70CM (CATHETERS) ×2 IMPLANT
CATH ROBINSON RED A/P 20FR (CATHETERS) IMPLANT
CATH URET DUAL LUMEN 6-10FR 50 (CATHETERS) IMPLANT
CATH X-FORCE N30 NEPHROSTOMY (TUBING) ×1 IMPLANT
COVER SURGICAL LIGHT HANDLE (MISCELLANEOUS) IMPLANT
DRAPE C-ARM 42X120 X-RAY (DRAPES) ×2 IMPLANT
DRAPE LINGEMAN PERC (DRAPES) ×2 IMPLANT
DRAPE SHEET LG 3/4 BI-LAMINATE (DRAPES) IMPLANT
DRAPE SURG IRRIG POUCH 19X23 (DRAPES) ×2 IMPLANT
DRSG PAD ABDOMINAL 8X10 ST (GAUZE/BANDAGES/DRESSINGS) ×4 IMPLANT
DRSG TEGADERM 8X12 (GAUZE/BANDAGES/DRESSINGS) ×4 IMPLANT
FIBER LASER FLEXIVA 1000 (UROLOGICAL SUPPLIES) IMPLANT
FIBER LASER FLEXIVA 365 (UROLOGICAL SUPPLIES) IMPLANT
FIBER LASER FLEXIVA 550 (UROLOGICAL SUPPLIES) IMPLANT
FIBER LASER TRAC TIP (UROLOGICAL SUPPLIES) IMPLANT
GAUZE SPONGE 4X4 12PLY STRL (GAUZE/BANDAGES/DRESSINGS) ×2 IMPLANT
GLOVE BIO SURGEON STRL SZ8 (GLOVE) ×4 IMPLANT
GLOVE BIOGEL PI IND STRL 8 (GLOVE) IMPLANT
GLOVE BIOGEL PI INDICATOR 8 (GLOVE) ×1
GOWN STRL REUS W/TWL LRG LVL3 (GOWN DISPOSABLE) ×3 IMPLANT
GOWN STRL REUS W/TWL XL LVL3 (GOWN DISPOSABLE) ×2 IMPLANT
GUIDEWIRE AMPLAZ .035X145 (WIRE) ×2 IMPLANT
GUIDEWIRE STR DUAL SENSOR (WIRE) ×2 IMPLANT
IV SET EXTENSION CATH 6 NF (IV SETS) ×1 IMPLANT
KIT BASIN OR (CUSTOM PROCEDURE TRAY) ×2 IMPLANT
MANIFOLD NEPTUNE II (INSTRUMENTS) ×2 IMPLANT
NDL TROCAR 18X15 ECHO (NEEDLE) IMPLANT
NDL TROCAR 18X20 (NEEDLE) IMPLANT
NEEDLE TROCAR 18X15 ECHO (NEEDLE) IMPLANT
NEEDLE TROCAR 18X20 (NEEDLE) IMPLANT
NS IRRIG 1000ML POUR BTL (IV SOLUTION) ×2 IMPLANT
PACK CYSTO (CUSTOM PROCEDURE TRAY) ×2 IMPLANT
PAD ABD 8X10 STRL (GAUZE/BANDAGES/DRESSINGS) ×1 IMPLANT
PROBE LITHOCLAST ULTRA 3.8X403 (UROLOGICAL SUPPLIES) IMPLANT
PROBE PNEUMATIC 1.0MMX570MM (UROLOGICAL SUPPLIES) IMPLANT
SET AMPLATZ RENAL DILATOR (MISCELLANEOUS) IMPLANT
SET IRRIG Y TYPE TUR BLADDER L (SET/KITS/TRAYS/PACK) ×1 IMPLANT
SHEATH PEELAWAY SET 9 (SHEATH) IMPLANT
SPONGE LAP 4X18 X RAY DECT (DISPOSABLE) ×2 IMPLANT
STENT CONTOUR 6FRX26X.038 (STENTS) IMPLANT
STONE CATCHER W/TUBE ADAPTER (UROLOGICAL SUPPLIES) IMPLANT
SUT SILK 2 0 30  PSL (SUTURE) ×1
SUT SILK 2 0 30 PSL (SUTURE) ×1 IMPLANT
SUT VIC AB 3-0 SH 27 (SUTURE) ×2
SUT VIC AB 3-0 SH 27X BRD (SUTURE) ×1 IMPLANT
SYR 10ML LL (SYRINGE) ×2 IMPLANT
SYR 20CC LL (SYRINGE) ×4 IMPLANT
SYR 50ML LL SCALE MARK (SYRINGE) ×1 IMPLANT
TAPE PAPER 3X10 WHT MICROPORE (GAUZE/BANDAGES/DRESSINGS) ×1 IMPLANT
TOWEL OR 17X26 10 PK STRL BLUE (TOWEL DISPOSABLE) ×2 IMPLANT
TUBING CONNECTING 10 (TUBING) ×3 IMPLANT
WATER STERILE IRR 1000ML POUR (IV SOLUTION) ×1 IMPLANT

## 2018-02-25 NOTE — Discharge Instructions (Signed)
Percutaneous Nephrolithotomy, Care After °Refer to this sheet in the next few weeks. These instructions provide you with information about caring for yourself after your procedure. Your health care provider may also give you more specific instructions. Your treatment has been planned according to current medical practices, but problems sometimes occur. Call your health care provider if you have any problems or questions after your procedure. °What can I expect after the procedure? °After the procedure, it is common to have: °· Soreness or pain. °· Fatigue. °· Some blood in your urine for a few days. ° °Follow these instructions at home: °Incision care ° °· Follow instructions from your health care provider about how to take care of your cut from surgery (incision). Make sure you: °? Wash your hands with soap and water before you change your bandage (dressing). If soap and water are not available, use hand sanitizer. °? Change your dressing as told by your health care provider. °? Leave stitches (sutures), skin glue, or adhesive strips in place. These skin closures may need to stay in place for 2 weeks or longer. If adhesive strip edges start to loosen and curl up, you may trim the loose edges. Do not remove adhesive strips completely unless your health care provider tells you to do that. °· Check your incision area every day for signs of infection. Check for: °? More redness, swelling, or pain. °? More fluid or blood. °? Warmth. °? Pus or a bad smell. °Activity °· Avoid strenuous activities for as long as told by your health care provider. °· Return to your normal activities as told by your health care provider. Ask your health care provider what activities are safe for you. °General instructions °· Take over-the-counter and prescription medicines only as told by your health care provider. °· Do not drive or operate heavy machinery while taking prescription pain medicine. °· Keep all follow-up visits as told by your  health care provider. This is important. °· You may have been sent home with a catheter or kidney drain tube. If so, carefully follow your health care provider’s instructions on how to take care of your catheter or kidney drain tube. °Contact a health care provider if: °· You have a fever. °· You have more redness, swelling, or pain around your incision. °· You have more fluid or blood coming from your incision. °· Your incision feels warm to the touch. °· You have pus or a bad smell coming from your incision. °· You lose your appetite. °· You feel nauseous or you vomit. °Get help right away if: °· You have blood clots in your urine. °· You cannot urinate. °· You have chest pain or trouble breathing. °This information is not intended to replace advice given to you by your health care provider. Make sure you discuss any questions you have with your health care provider. °Document Released: 12/14/2015 Document Revised: 04/24/2016 Document Reviewed: 05/07/2015 °Elsevier Interactive Patient Education © 2018 Elsevier Inc. ° °

## 2018-02-25 NOTE — Interval H&P Note (Signed)
History and Physical Interval Note:  02/25/2018 7:35 AM  Teresa Osborne  has presented today for surgery, with the diagnosis of LEFT RENAL STONES  The various methods of treatment have been discussed with the patient and family. After consideration of risks, benefits and other options for treatment, the patient has consented to  Procedure(s): NEPHROLITHOTOMY PERCUTANEOUS SECOND LOOK (Left) HOLMIUM LASER APPLICATION (Left) as a surgical intervention .  The patient's history has been reviewed, patient examined, no change in status, stable for surgery.  I have reviewed the patient's chart and labs.  Questions were answered to the patient's satisfaction.     Wilkie AyePatrick Leanna Hamid

## 2018-02-25 NOTE — Anesthesia Postprocedure Evaluation (Signed)
Anesthesia Post Note  Patient: Teresa Osborne  Procedure(s) Performed: NEPHROLITHOTOMY PERCUTANEOUS SECOND LOOK (Left )     Patient location during evaluation: PACU Anesthesia Type: General Level of consciousness: awake Pain management: pain level controlled Vital Signs Assessment: post-procedure vital signs reviewed and stable Respiratory status: spontaneous breathing Cardiovascular status: stable Postop Assessment: no apparent nausea or vomiting Anesthetic complications: no    Last Vitals:  Vitals:   02/25/18 0930 02/25/18 0945  BP: 132/62 138/61  Pulse: 66 61  Resp: (!) 21 17  Temp:    SpO2: 100% 100%    Last Pain:  Vitals:   02/25/18 0945  TempSrc:   PainSc: 4    Pain Goal: Patients Stated Pain Goal: 3 (02/25/18 0600)               Zarah Carbon JR,JOHN Susann GivensFRANKLIN

## 2018-02-25 NOTE — Brief Op Note (Signed)
02/25/2018  8:51 AM  PATIENT:  Teresa Osborne  42 y.o. female  PRE-OPERATIVE DIAGNOSIS:  LEFT RENAL STONES  POST-OPERATIVE DIAGNOSIS:  LEFT RENAL STONES  PROCEDURE:  Procedure(s): NEPHROLITHOTOMY PERCUTANEOUS SECOND LOOK (Left) HOLMIUM LASER APPLICATION (Left)  SURGEON:  Surgeon(s) and Role:    * Rachael Ferrie, Mardene CelestePatrick L, MD - Primary  PHYSICIAN ASSISTANT:   ASSISTANTS: none   ANESTHESIA:   general  EBL:  minimal   BLOOD ADMINISTERED:none  DRAINS: none   LOCAL MEDICATIONS USED:  NONE  SPECIMEN:  Source of Specimen:  stone  DISPOSITION OF SPECIMEN:  N/A  COUNTS:  YES  TOURNIQUET:  * No tourniquets in log *  DICTATION: .Note written in EPIC  PLAN OF CARE: Discharge to home after PACU  PATIENT DISPOSITION:  PACU - hemodynamically stable.   Delay start of Pharmacological VTE agent (>24hrs) due to surgical blood loss or risk of bleeding: not applicable

## 2018-02-25 NOTE — Transfer of Care (Signed)
Immediate Anesthesia Transfer of Care Note  Patient: Teresa Osborne  Procedure(s) Performed: NEPHROLITHOTOMY PERCUTANEOUS SECOND LOOK (Left )  Patient Location: PACU  Anesthesia Type:General  Level of Consciousness: awake, alert  and oriented  Airway & Oxygen Therapy: Patient Spontanous Breathing and Patient connected to face mask oxygen  Post-op Assessment: Report given to RN and Post -op Vital signs reviewed and stable  Post vital signs: Reviewed and stable  Last Vitals:  Vitals Value Taken Time  BP 93/70 02/25/2018  9:10 AM  Temp    Pulse 78 02/25/2018  9:12 AM  Resp 23 02/25/2018  9:12 AM  SpO2 100 % 02/25/2018  9:12 AM  Vitals shown include unvalidated device data.  Last Pain:  Vitals:   02/25/18 0600  TempSrc:   PainSc: 5       Patients Stated Pain Goal: 3 (02/25/18 0600)  Complications: No apparent anesthesia complications

## 2018-02-25 NOTE — Anesthesia Procedure Notes (Signed)
Procedure Name: Intubation Date/Time: 02/25/2018 7:49 AM Performed by: Thornell MuleStubblefield, Grethel Zenk G, CRNA Pre-anesthesia Checklist: Patient identified, Emergency Drugs available, Suction available and Patient being monitored Patient Re-evaluated:Patient Re-evaluated prior to induction Oxygen Delivery Method: Circle system utilized Preoxygenation: Pre-oxygenation with 100% oxygen Induction Type: IV induction Ventilation: Mask ventilation without difficulty Laryngoscope Size: Miller and 3 Grade View: Grade I Tube type: Oral Tube size: 7.5 mm Number of attempts: 1 Airway Equipment and Method: Stylet and Oral airway Placement Confirmation: ETT inserted through vocal cords under direct vision,  positive ETCO2 and breath sounds checked- equal and bilateral Secured at: 21 cm Tube secured with: Tape Dental Injury: Teeth and Oropharynx as per pre-operative assessment

## 2018-02-26 ENCOUNTER — Encounter (HOSPITAL_COMMUNITY): Payer: Self-pay | Admitting: Urology

## 2018-03-08 NOTE — Op Note (Signed)
Preoperative diagnosis: Left renal stone  Postoperative diagnosis: Same  Procedure 1.  Left percutaneous nephrostolithotomy for stone less than 2 cm 2.  Left nephrostogram 3.  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 She has undergone PCNL and presents for a second look PCNL.  After discussing treatment options and decided she was left percutaneous nephrostolithotomy.  Patient already has a nephrostomy tube.  Procedure in detail: Prior to procedure consent was obtained.  Patient was brought to the operating room debridement was done to ensure correct patient, correct procedure, and correct site.  General anesthesia was administered.  A 16 French Foley catheter was in place.  The patient was then placed in the prone position.  His nephrostomy tube and left flank was then prepped and draped in usual sterile fashion.  A nephrostogram was obtained and findings noted above. The nephrostomy tube was removed and we used the flexible nephroscope through the tract to perform nephroscopy.  [facility] stone fragments and sent for composition analysis.  Once the majority of the stone was removed we then were able to perform ureteroscopy with a flexible ureteroscope.  We then removed the stent with a grasper. Once this was complete we removed the scope and closed the incision with a 2-0 vicryl.  Dressing was placed over the nephrostomy tube site and this then concluded the procedure was well-tolerated by the patient.  Complications: None  Condition: Stable, extubated, transferred to PACU  Plan: Patient is to be discharged home and followup in  1 week

## 2018-05-14 ENCOUNTER — Ambulatory Visit: Payer: Self-pay | Admitting: Women's Health

## 2018-05-19 ENCOUNTER — Encounter: Payer: Self-pay | Admitting: Advanced Practice Midwife

## 2018-05-19 ENCOUNTER — Ambulatory Visit: Payer: Managed Care, Other (non HMO) | Admitting: Advanced Practice Midwife

## 2018-05-19 ENCOUNTER — Encounter (INDEPENDENT_AMBULATORY_CARE_PROVIDER_SITE_OTHER): Payer: Self-pay

## 2018-05-19 VITALS — BP 126/74 | HR 63 | Ht 66.0 in | Wt >= 6400 oz

## 2018-05-19 DIAGNOSIS — Z3009 Encounter for other general counseling and advice on contraception: Secondary | ICD-10-CM

## 2018-05-19 DIAGNOSIS — Z3202 Encounter for pregnancy test, result negative: Secondary | ICD-10-CM

## 2018-05-19 MED ORDER — MISOPROSTOL 200 MCG PO TABS: ORAL_TABLET | ORAL | 1 refills | Status: DC

## 2018-05-19 NOTE — Progress Notes (Signed)
[facility] Visit  Patient name: Teresa Osborne MRN [id number]  Date of birth: 10-12-1976  CC & HPI:  Teresa Osborne is a 42 y.o. Caucasian female presenting today for IUD removal/reinsertion consult.  Has had Mirena for 5 years, this will be her 3rd IUD.  Discussed that Penni BombardLiletta should have 6 year approval, wants that one.   Pertinent History Reviewed:  Medical & Surgical Hx:   Past Medical History:  Diagnosis Date  . Anxiety   . History of kidney stones   . Kidney stones    [place]  . PONV (postoperative nausea and vomiting)    NOV 2018, JAN TREMORS LASTED ABOUT 15 MINUTES, LIKES SCOPOLOMINE PATCH; no issues wuth last kidney stone surgery   . Pre-diabetes    Past Surgical History:  Procedure Laterality Date  . CYSTOSCOPY  10/27/2017   Procedure: CYSTOSCOPY FLEXIBLE;  Surgeon: Malen GauzeMcKenzie, Patrick L, MD;  Location: [facility];  Service: Urology;;  . Bluford KaufmannYSTOSCOPY WITH RETROGRADE PYELOGRAM, URETEROSCOPY AND STENT PLACEMENT Left 01/21/2018   Procedure: CYSTOSCOPY WITH RETROGRADE PYELOGRAM, URETEROSCOPY AND STENT PLACEMENT;  Surgeon: Malen GauzeMcKenzie, Patrick L, MD;  Location: [facility];  Service: Urology;  Laterality: Left;  . HOLMIUM LASER APPLICATION Left 01/21/2018   Procedure: HOLMIUM LASER APPLICATION;  Surgeon: Malen GauzeMcKenzie, Patrick L, MD;  Location: [facility];  Service: Urology;  Laterality: Left;  . IR URETERAL STENT LEFT NEW ACCESS W/O SEP NEPHROSTOMY CATH  02/18/2018  . NEPHROLITHOTOMY Left 12/07/2017   Procedure: LEFT NEPHROLITHOTOMY PERCUTANEOUS AND STENT PLACEMENT;  Surgeon: Malen GauzeMcKenzie, Patrick L, MD;  Location: [facility];  Service: Urology;  Laterality: Left;  . NEPHROLITHOTOMY Left 02/19/2018   Procedure: NEPHROLITHOTOMY PERCUTANEOUS;  Surgeon: Malen GauzeMcKenzie, Patrick L, MD;  Location: [facility];  Service: Urology;  Laterality: Left;  . NEPHROLITHOTOMY Left 02/25/2018   Procedure: NEPHROLITHOTOMY PERCUTANEOUS SECOND LOOK;  Surgeon: Malen GauzeMcKenzie, Patrick L, MD;  Location: [facility];  Service: Urology;   Laterality: Left;  . NEPHROSTOMY Left 10/27/2017   Procedure: NEPHROSTOMY TUBE PLACEMENT;  Surgeon: Malen GauzeMcKenzie, Patrick L, MD;  Location: [facility];  Service: Urology;  Laterality: Left;  . NO PAST SURGERIES     Family History  Problem Relation Age of Onset  . Cancer Paternal Grandfather        pancreatic  . Alcohol abuse Paternal Grandfather   . Arthritis Paternal Grandfather   . COPD Paternal Grandfather   . Early death Paternal Grandfather   . Cancer Paternal Grandmother        unknown, [place]  . Arthritis Paternal Grandmother   . COPD Paternal Grandmother   . Early death Paternal Grandmother   . Arthritis Father   . Mental retardation Father   . Arthritis Maternal Grandmother   . COPD Maternal Grandfather   . Heart disease Maternal Grandfather   . Cancer Sister [id number]       ovarian    Current Outpatient Medications:  .  ibuprofen (ADVIL,MOTRIN) 200 MG tablet, Take 600 mg by mouth 2 (two) times daily as needed for moderate pain. , Disp: , Rfl:  .  levonorgestrel (MIRENA) 20 MCG/24HR IUD, 1 each once by Intrauterine route., Disp: , Rfl:  .  sulfamethoxazole-trimethoprim (BACTRIM DS,SEPTRA DS) 800-160 MG tablet, Take 1 tablet by mouth daily., Disp: 30 tablet, Rfl: 3 .  misoprostol (CYTOTEC) 200 MCG tablet, Take 3 tabs by mouth 4-5 hours before appointment, Disp: 4 tablet, Rfl: 1 .  ondansetron (ZOFRAN) 4 MG tablet, Take 1 tablet (4 mg total) by mouth every 8 (eight) hours as needed  for nausea or vomiting. (Patient not taking: Reported on 05/19/2018), Disp: 30 tablet, Rfl: 0 .  oxyCODONE-acetaminophen (PERCOCET) 5-325 MG tablet, Take 1 tablet by mouth every 4 (four) hours as needed for severe pain. (Patient not taking: Reported on 05/19/2018), Disp: 30 tablet, Rfl: 0 .  oxyCODONE-acetaminophen (PERCOCET/ROXICET) 5-325 MG tablet, Take 1 tablet by mouth every 4 (four) hours as needed for moderate pain. (Patient not taking: Reported on 05/19/2018), Disp: 30 tablet, Rfl: 0 Social History: Reviewed -   reports that she has never smoked. She has never used smokeless tobacco.  Review of Systems:   Constitutional: Negative for fever and chills Eyes: Negative for visual disturbances Respiratory: Negative for shortness of breath, dyspnea Cardiovascular: Negative for chest pain or palpitations  Gastrointestinal: Negative for vomiting, diarrhea and constipation; no abdominal pain Genitourinary: Negative for dysuria and urgency, vaginal irritation or itching Musculoskeletal: Negative for back pain, joint pain, myalgias  Neurological: Negative for dizziness and headaches    Objective Findings:    Physical Examination: Vitals:   05/19/18 0841  BP: 126/74  Pulse: 63   General appearance - well appearing, and in no distress Mental status - alert, oriented to person, place, and time Chest:  Normal respiratory effort Heart - normal rate and regular rhythm Musculoskeletal:  Normal range of motion without pain Extremities:  No edema    No results found for this or any previous visit (from the past 24 hour(s)).    Assessment & Plan:  A:   IUD consult P:  Premedicate w/cytotec   Return for liletta on Friday .  Jacklyn Shell CNM 05/19/2018 9:36 AM

## 2018-05-21 ENCOUNTER — Ambulatory Visit: Payer: Managed Care, Other (non HMO) | Admitting: Women's Health

## 2018-05-21 ENCOUNTER — Encounter: Payer: Self-pay | Admitting: Women's Health

## 2018-05-21 VITALS — BP 143/82 | HR 91 | Ht 66.0 in | Wt >= 6400 oz

## 2018-05-21 DIAGNOSIS — Z3202 Encounter for pregnancy test, result negative: Secondary | ICD-10-CM | POA: Diagnosis not present

## 2018-05-21 DIAGNOSIS — Z30433 Encounter for removal and reinsertion of intrauterine contraceptive device: Secondary | ICD-10-CM | POA: Diagnosis not present

## 2018-05-21 LAB — POCT URINE PREGNANCY: Preg Test, Ur: NEGATIVE

## 2018-05-21 MED ORDER — LEVONORGESTREL 19.5 MCG/DAY IU IUD
INTRAUTERINE_SYSTEM | Freq: Once | INTRAUTERINE | Status: AC
Start: 2018-05-21 — End: 2018-05-21
  Administered 2018-05-21: 14:00:00 via INTRAUTERINE

## 2018-05-21 NOTE — Patient Instructions (Signed)
 Nothing in vagina for 3 days (no sex, douching, tampons, etc...)  Check your strings once a month to make sure you can feel them, if you are not able to please let us know  If you develop a fever of 100.4 or more in the next few weeks, or if you develop severe abdominal pain, please let us know  Use a backup method of birth control, such as condoms, for 2 weeks    Intrauterine Device Insertion, Care After This sheet gives you information about how to care for yourself after your procedure. Your health care provider may also give you more specific instructions. If you have problems or questions, contact your health care provider. What can I expect after the procedure? After the procedure, it is common to have:  Cramps and pain in the abdomen.  Light bleeding (spotting) or heavier bleeding that is like your menstrual period. This may last for up to a few days.  Lower back pain.  Dizziness.  Headaches.  Nausea.  Follow these instructions at home:  Before resuming sexual activity, check to make sure that you can feel the IUD string(s). You should be able to feel the end of the string(s) below the opening of your cervix. If your IUD string is in place, you may resume sexual activity. ? If you had a hormonal IUD inserted more than 7 days after your most recent period started, you will need to use a backup method of birth control for 7 days after IUD insertion. Ask your health care provider whether this applies to you.  Continue to check that the IUD is still in place by feeling for the string(s) after every menstrual period, or once a month.  Take over-the-counter and prescription medicines only as told by your health care provider.  Do not drive or use heavy machinery while taking prescription pain medicine.  Keep all follow-up visits as told by your health care provider. This is important. Contact a health care provider if:  You have bleeding that is heavier or lasts longer than  a normal menstrual cycle.  You have a fever.  You have cramps or abdominal pain that get worse or do not get better with medicine.  You develop abdominal pain that is new or is not in the same area of earlier cramping and pain.  You feel lightheaded or weak.  You have abnormal or bad-smelling discharge from your vagina.  You have pain during sexual activity.  You have any of the following problems with your IUD string(s): ? The string bothers or hurts you or your sexual partner. ? You cannot feel the string. ? The string has gotten longer.  You can feel the IUD in your vagina.  You think you may be pregnant, or you miss your menstrual period.  You think you may have an STI (sexually transmitted infection). Get help right away if:  You have flu-like symptoms.  You have a fever and chills.  You can feel that your IUD has slipped out of place. Summary  After the procedure, it is common to have cramps and pain in the abdomen. It is also common to have light bleeding (spotting) or heavier bleeding that is like your menstrual period.  Continue to check that the IUD is still in place by feeling for the string(s) after every menstrual period, or once a month.  Keep all follow-up visits as told by your health care provider. This is important.  Contact your health care provider if   you have problems with your IUD string(s), such as the string getting longer or bothering you or your sexual partner. This information is not intended to replace advice given to you by your health care provider. Make sure you discuss any questions you have with your health care provider. Document Released: 07/16/2011 Document Revised: 10/08/2016 Document Reviewed: 10/08/2016 Elsevier Interactive Patient Education  2017 Elsevier Inc.  Levonorgestrel intrauterine device (IUD) What is this medicine? LEVONORGESTREL IUD (LEE voe nor jes trel) is a contraceptive (birth control) device. The device is placed  inside the uterus by a healthcare professional. It is used to prevent pregnancy. This device can also be used to treat heavy bleeding that occurs during your period. This medicine may be used for other purposes; ask your health care provider or pharmacist if you have questions. COMMON BRAND NAME(S): Kyleena, LILETTA, Mirena, Skyla What should I tell my health care provider before I take this medicine? They need to know if you have any of these conditions: -abnormal Pap smear -cancer of the breast, uterus, or cervix -diabetes -endometritis -genital or pelvic infection now or in the past -have more than one sexual partner or your partner has more than one partner -heart disease -history of an ectopic or tubal pregnancy -immune system problems -IUD in place -liver disease or tumor -problems with blood clots or take blood-thinners -seizures -use intravenous drugs -uterus of unusual shape -vaginal bleeding that has not been explained -an unusual or allergic reaction to levonorgestrel, other hormones, silicone, or polyethylene, medicines, foods, dyes, or preservatives -pregnant or trying to get pregnant -breast-feeding How should I use this medicine? This device is placed inside the uterus by a health care professional. Talk to your pediatrician regarding the use of this medicine in children. Special care may be needed. Overdosage: If you think you have taken too much of this medicine contact a poison control center or emergency room at once. NOTE: This medicine is only for you. Do not share this medicine with others. What if I miss a dose? This does not apply. Depending on the brand of device you have inserted, the device will need to be replaced every 3 to 5 years if you wish to continue using this type of birth control. What may interact with this medicine? Do not take this medicine with any of the following medications: -amprenavir -bosentan -fosamprenavir This medicine may also  interact with the following medications: -aprepitant -armodafinil -barbiturate medicines for inducing sleep or treating seizures -bexarotene -boceprevir -griseofulvin -medicines to treat seizures like carbamazepine, ethotoin, felbamate, oxcarbazepine, phenytoin, topiramate -modafinil -pioglitazone -rifabutin -rifampin -rifapentine -some medicines to treat HIV infection like atazanavir, efavirenz, indinavir, lopinavir, nelfinavir, tipranavir, ritonavir -St. John's wort -warfarin This list may not describe all possible interactions. Give your health care provider a list of all the medicines, herbs, non-prescription drugs, or dietary supplements you use. Also tell them if you smoke, drink alcohol, or use illegal drugs. Some items may interact with your medicine. What should I watch for while using this medicine? Visit your doctor or health care professional for regular check ups. See your doctor if you or your partner has sexual contact with others, becomes HIV positive, or gets a sexual transmitted disease. This product does not protect you against HIV infection (AIDS) or other sexually transmitted diseases. You can check the placement of the IUD yourself by reaching up to the top of your vagina with clean fingers to feel the threads. Do not pull on the threads. It is a good habit   to check placement after each menstrual period. Call your doctor right away if you feel more of the IUD than just the threads or if you cannot feel the threads at all. The IUD may come out by itself. You may become pregnant if the device comes out. If you notice that the IUD has come out use a backup birth control method like condoms and call your health care provider. Using tampons will not change the position of the IUD and are okay to use during your period. This IUD can be safely scanned with magnetic resonance imaging (MRI) only under specific conditions. Before you have an MRI, tell your healthcare provider that  you have an IUD in place, and which type of IUD you have in place. What side effects may I notice from receiving this medicine? Side effects that you should report to your doctor or health care professional as soon as possible: -allergic reactions like skin rash, itching or hives, swelling of the face, lips, or tongue -fever, flu-like symptoms -genital sores -high blood pressure -no menstrual period for 6 weeks during use -pain, swelling, warmth in the leg -pelvic pain or tenderness -severe or sudden headache -signs of pregnancy -stomach cramping -sudden shortness of breath -trouble with balance, talking, or walking -unusual vaginal bleeding, discharge -yellowing of the eyes or skin Side effects that usually do not require medical attention (report to your doctor or health care professional if they continue or are bothersome): -acne -breast pain -change in sex drive or performance -changes in weight -cramping, dizziness, or faintness while the device is being inserted -headache -irregular menstrual bleeding within first 3 to 6 months of use -nausea This list may not describe all possible side effects. Call your doctor for medical advice about side effects. You may report side effects to FDA at 1-800-FDA-1088. Where should I keep my medicine? This does not apply. NOTE: This sheet is a summary. It may not cover all possible information. If you have questions about this medicine, talk to your doctor, pharmacist, or health care provider.  2018 Elsevier/Gold Standard (2016-08-29 14:14:56)   

## 2018-05-21 NOTE — Progress Notes (Signed)
   IUD REMOVAL & RE-INSERTION Patient name: Teresa Osborne MRN [id number]  Date of birth: @DOB  Subjective Findings:   @Evalina  Teresa Osborne is a 42 y.o. 492P2002 Caucasian female being seen today for removal of a Mirena IUD and insertion of a Liletta IUD. Her IUD was placed 2014.  She took cytotec as directed this am.   No LMP recorded. (Menstrual status: IUD). Last pap2012. Results were:  normal  The risks and benefits of the method and placement have been thouroughly reviewed with the patient and all questions were answered.  Specifically the patient is aware of failure rate of 12/998, expulsion of the IUD and of possible perforation.  The patient is aware of irregular bleeding due to the method and understands the incidence of irregular bleeding diminishes with time.  Signed copy of informed consent in chart.  Pertinent History Reviewed:   Reviewed past medical,surgical, social, obstetrical and family history.  Reviewed problem list, medications and allergies. Objective Findings & Procedure:    Vitals:   05/21/18 1219  BP: (!) 143/82  Pulse: 91  Weight: (!) 422 lb (191.4 kg)  Height: 5\' 6"  (1.676 m)  Body mass index is 68.11 kg/m.  Results for orders placed or performed in visit on 05/21/18 (from the past 24 hour(s))  POCT urine pregnancy   Collection Time: 05/21/18 12:23 PM  Result Value Ref Range   Preg Test, Ur Negative Negative     Time out was performed.  A graves speculum was placed in the vagina.  The cervix was visualized, prepped using Betadine. The strings were visible. They were grasped and the Mirena IUD was easily removed. The cervix was then grasped with a single-tooth tenaculum. The uterus was found to be anteroflexed and it sounded to 11 cm.  Liletta IUD placed per manufacturer's recommendations without complications. The strings were trimmed to approximately 3 cm.  The patient tolerated the procedure well.   Informal transvaginal sonogram was performed and the  proper placement of the IUD was verified by myself and LHE, multiple nabothian cysts noted LUS/cx Assessment & Plan:   1) Mirena IUD removal & insertion of Liletta IUD The patient was given post procedure instructions, including signs and symptoms of infection and to check for the strings after each menses or each month, and refraining from intercourse or anything in the vagina for 3 days. She was given a Liletta care card with date IUD placed, and date IUD to be removed. She is scheduled for a f/u appointment in 4 weeks.  Orders Placed This Encounter  Procedures  . POCT urine pregnancy    Follow-up: Return in about 1 month (around 06/18/2018) for Pap & physical & IUD f/u.  Cheral MarkerKimberly R Saroya Riccobono CNM, [facility]NP[facility] 05/21/2018 1:40 PM

## 2018-06-18 ENCOUNTER — Other Ambulatory Visit (HOSPITAL_COMMUNITY)
Admission: RE | Admit: 2018-06-18 | Discharge: 2018-06-18 | Disposition: A | Discharge status: Home / Self Care | Payer: Managed Care, Other (non HMO) | Source: Ambulatory Visit | Attending: Obstetrics & Gynecology | Admitting: Obstetrics & Gynecology

## 2018-06-18 ENCOUNTER — Ambulatory Visit (INDEPENDENT_AMBULATORY_CARE_PROVIDER_SITE_OTHER): Payer: Managed Care, Other (non HMO) | Admitting: Women's Health

## 2018-06-18 ENCOUNTER — Encounter (INDEPENDENT_AMBULATORY_CARE_PROVIDER_SITE_OTHER): Payer: Self-pay

## 2018-06-18 ENCOUNTER — Encounter: Payer: Self-pay | Admitting: Women's Health

## 2018-06-18 VITALS — BP 138/82 | HR 85 | Ht 66.0 in | Wt >= 6400 oz

## 2018-06-18 DIAGNOSIS — Z01419 Encounter for gynecological examination (general) (routine) without abnormal findings: Secondary | ICD-10-CM | POA: Insufficient documentation

## 2018-06-18 DIAGNOSIS — Z1151 Encounter for screening for human papillomavirus (HPV): Secondary | ICD-10-CM | POA: Insufficient documentation

## 2018-06-18 DIAGNOSIS — Z30431 Encounter for routine checking of intrauterine contraceptive device: Secondary | ICD-10-CM

## 2018-06-18 NOTE — Progress Notes (Signed)
   WELL-WOMAN EXAMINATION Patient name: Teresa Osborne MRN [id number]  Date of birth: 1976/10/04 Chief Complaint:   Annual Exam  History of Present Illness:   Teresa Osborne is a 42 y.o. 612P2002 Caucasian female being seen today for a routine well-woman exam and IUD check. Had Mirena removed and Liletta inserted 05/21/18. No problems w/ IUD, bleeding stopped 2d after insertion, no pain w/ sex. Husband couldn't feel strings w/ sex.  States she knows she needs to lose weight, contemplating bariatric surgery, plans to talk to PCP in Aug at upcoming visit.  Current complaints: none  [facility]ib[facility]ff[facility]      does not desire labs, will do w/ PCP No LMP recorded. (Menstrual status: IUD). The current method of family planning is IUD Last pap 2012. Results were: normal Last mammogram: never. Results were: n/a Last colonoscopy: never. Results were: n/a  Review of Systems:   Pertinent items are noted in HPI Denies any headaches, blurred vision, fatigue, shortness of breath, chest pain, abdominal pain, abnormal vaginal discharge/itching/odor/irritation, problems with periods, bowel movements, urination, or intercourse unless otherwise stated above. Pertinent History Reviewed:  Reviewed past medical,surgical, social and family history.  Reviewed problem list, medications and allergies. Physical Assessment:   Vitals:   06/18/18 0850  BP: 138/82  Pulse: 85  Weight: (!) 431 lb 9.6 oz (195.8 kg)  Height: 5\' 6"  (1.676 m)  Body mass index is 69.66 kg/m.        Physical Examination:   General appearance - well appearing, and in no distress  Mental status - alert, oriented to person, place, and time  Psych:  She has a normal mood and affect  Skin - warm and dry, normal color, no suspicious lesions noted  Chest - effort normal, all lung fields clear to auscultation bilaterally  Heart - normal rate and regular rhythm  Neck:  midline trachea, no thyromegaly or nodules  Breasts - breasts  appear normal, no suspicious masses, no skin or nipple changes or  axillary nodes  Abdomen - soft, nontender, nondistended, no masses or organomegaly  Pelvic - VULVA: normal appearing vulva with no masses, tenderness or lesions  VAGINA: normal appearing vagina with normal color and discharge, no lesions  CERVIX: normal appearing cervix without discharge or lesions, no CMT, IUD strings visible-tucked behind cx  Thin prep pap is done w/ HR HPV cotesting  UTERUS: unable to adequately assess d/t body habitus, no tenderness   ADNEXA: unable to adequately assess d/t body habitus, no tenderness  Extremities:  No swelling or varicosities noted  No results found for this or any previous visit (from the past 24 hour(s)).  Assessment & Plan:  1) Well-Woman Exam  2) Liletta IUD check> in place  3) BMI 69.66> plans to discuss bariatric surgery w/ PCP in Aug  4) Past due for screening mammogram  Labs/procedures today: pap  Mammogram-pt lives in [place], prefers to do there- to call to schedule Colonoscopy @42yo  or sooner if problems  No orders of the defined types were placed in this encounter.   Follow-up: Return in about 1 year (around 06/19/2019) for Physical.  Cheral MarkerKimberly R Carmine Youngberg CNM, WHNP-BC 06/18/2018 9:25 AM

## 2018-06-18 NOTE — Patient Instructions (Signed)
Schedule your mammogram;

## 2018-06-18 NOTE — Addendum Note (Signed)
Addended by: Moss McRESENZO, LATISHA M on: 06/18/2018 10:42 AM   Modules accepted: Orders

## 2018-06-21 LAB — CYTOLOGY - PAP
CHLAMYDIA, DNA PROBE: NEGATIVE
DIAGNOSIS: NEGATIVE
HPV: DETECTED — AB
Neisseria Gonorrhea: NEGATIVE

## 2018-06-22 ENCOUNTER — Encounter: Payer: Self-pay | Admitting: Women's Health

## 2018-06-22 DIAGNOSIS — R87810 Cervical high risk human papillomavirus (HPV) DNA test positive: Secondary | ICD-10-CM | POA: Insufficient documentation

## 2018-06-23 ENCOUNTER — Telehealth: Payer: Self-pay | Admitting: *Deleted

## 2018-06-23 NOTE — Telephone Encounter (Signed)
Patient informed pap was normal but HPV+ and will need repeat pap in 1 year instead of 3.  Pt verbalized understanding with no further questions.

## 2018-09-15 ENCOUNTER — Encounter: Payer: Self-pay | Admitting: Family Medicine

## 2018-09-15 ENCOUNTER — Ambulatory Visit (INDEPENDENT_AMBULATORY_CARE_PROVIDER_SITE_OTHER): Payer: Managed Care, Other (non HMO) | Admitting: Family Medicine

## 2018-09-15 VITALS — BP 139/79 | HR 82 | Temp 98.4°F | Resp 20 | Ht 66.0 in | Wt >= 6400 oz

## 2018-09-15 DIAGNOSIS — E559 Vitamin D deficiency, unspecified: Secondary | ICD-10-CM | POA: Diagnosis not present

## 2018-09-15 DIAGNOSIS — Z1239 Encounter for other screening for malignant neoplasm of breast: Secondary | ICD-10-CM

## 2018-09-15 DIAGNOSIS — Z130 Encounter for screening for diseases of the blood and blood-forming organs and certain disorders involving the immune mechanism: Secondary | ICD-10-CM

## 2018-09-15 DIAGNOSIS — Z0000 Encounter for general adult medical examination without abnormal findings: Secondary | ICD-10-CM | POA: Diagnosis not present

## 2018-09-15 DIAGNOSIS — R7989 Other specified abnormal findings of blood chemistry: Secondary | ICD-10-CM

## 2018-09-15 DIAGNOSIS — Z6841 Body Mass Index (BMI) 40.0 and over, adult: Secondary | ICD-10-CM

## 2018-09-15 DIAGNOSIS — Z13220 Encounter for screening for lipoid disorders: Secondary | ICD-10-CM

## 2018-09-15 DIAGNOSIS — E66813 Obesity, class 3: Secondary | ICD-10-CM

## 2018-09-15 DIAGNOSIS — Z79899 Other long term (current) drug therapy: Secondary | ICD-10-CM

## 2018-09-15 DIAGNOSIS — Z131 Encounter for screening for diabetes mellitus: Secondary | ICD-10-CM

## 2018-09-15 MED ORDER — NALTREXONE-BUPROPION HCL ER 8-90 MG PO TB12: ORAL_TABLET | ORAL | 2 refills | Status: DC

## 2018-09-15 NOTE — Progress Notes (Signed)
Patient ID: Teresa Osborne, female  DOB: [birth year], 42 y.o.   MRN: [id number] Patient Care Team    Relationship Specialty Notifications Start End  Ma Hillock, DO PCP - General Family Medicine  01/09/16     Chief Complaint  Patient presents with  . Annual Exam    Subjective:  Teresa Osborne is a 42 y.o.  Female  present for CPE. All past medical history, surgical history, allergies, family history, immunizations, medications and social history were updated in the electronic medical record today. All recent labs, ED visits and hospitalizations within the last year were reviewed.  Pt has had a difficult year. Her husband was between jobs, they loss his father a few months ago and her grandmother this week. 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 She had difficulty with a renal stone earlier this year requiring surgeries. She lost weight and then unfortunately once she returned to work, she gained all her weight back. She admits she stress eats and eats for comfort. She started weight loss management last year after physical, but unfortunately with her kidney stone problems and surgeries she got off track.   Health maintenance: updated 09/15/18 Colonoscopy: No known family history of colon cancer. Routine screening at age 4. Mammogram: No known history of breast cancer in the family. Screening ordered at breast center pt di dnot have completed last year. Re-ordered for her.   Cervical cancer screening: [facility], [place]. Orpha Bur, NP. Last Pap 05/2018,3-5 yr f/u. Continue follow-up with gynecology. Immunizations: tdap updated 2018, declines flu shot today. Infectious disease screening: HIV 2008 DEXA: n/a Assistive device: none Oxygen KJZ:PHXT Patient has a Dental home. Hospitalizations/ED visits: reviewed  Depression screen [facility] 2/9 09/15/2018 09/14/2017 09/11/2017 01/09/2016  Decreased Interest 0 0 0 0  Down, Depressed, Hopeless 1 0 0 0    PHQ - 2 Score 1 0 0 0  Altered sleeping 0 - - -  Tired, decreased energy 1 - - -  Change in appetite 1 - - -  Feeling bad or failure about yourself  1 - - -  Trouble concentrating 0 - - -  Moving slowly or fidgety/restless 0 - - -  Suicidal thoughts 0 - - -  PHQ-9 Score 4 - - -  Difficult doing work/chores Not difficult at all - - -   No flowsheet data found.   Current Exercise Habits: The patient does not participate in regular exercise at present Exercise limited by: None identified   Immunization History  Administered Date(s) Administered  . Tdap 12/01/2006, 09/14/2017     Past Medical History:  Diagnosis Date  . Anxiety   . History of kidney stones   . Kidney stones    [place]  . PONV (postoperative nausea and vomiting)    NOV 2018, JAN TREMORS LASTED ABOUT 15 MINUTES, LIKES [place]; no issues wuth last kidney stone surgery   . Pre-diabetes   . Vaginal Pap smear, abnormal    No Known Allergies Past Surgical History:  Procedure Laterality Date  . CYSTOSCOPY  10/27/2017   Procedure: CYSTOSCOPY FLEXIBLE;  Surgeon: Cleon Gustin, MD;  Location: [facility];  Service: Urology;;  . Consuela Mimes WITH RETROGRADE PYELOGRAM, URETEROSCOPY AND STENT PLACEMENT Left 01/21/2018   Procedure: Angus, URETEROSCOPY AND STENT PLACEMENT;  Surgeon: Cleon Gustin, MD;  Location: [facility];  Service: Urology;  Laterality: Left;  . HOLMIUM LASER APPLICATION Left 0/56/9794   Procedure: HOLMIUM  LASER APPLICATION;  Surgeon: Cleon Gustin, MD;  Location: [facility];  Service: Urology;  Laterality: Left;  . IR URETERAL STENT LEFT NEW ACCESS W/O SEP NEPHROSTOMY CATH  02/18/2018  . NEPHROLITHOTOMY Left 12/07/2017   Procedure: LEFT NEPHROLITHOTOMY PERCUTANEOUS AND STENT PLACEMENT;  Surgeon: Cleon Gustin, MD;  Location: [facility];  Service: Urology;  Laterality: Left;  . NEPHROLITHOTOMY Left 02/19/2018   Procedure: NEPHROLITHOTOMY PERCUTANEOUS;   Surgeon: Cleon Gustin, MD;  Location: [facility];  Service: Urology;  Laterality: Left;  . NEPHROLITHOTOMY Left 02/25/2018   Procedure: NEPHROLITHOTOMY PERCUTANEOUS SECOND LOOK;  Surgeon: Cleon Gustin, MD;  Location: [facility];  Service: Urology;  Laterality: Left;  . NEPHROSTOMY Left 10/27/2017   Procedure: NEPHROSTOMY TUBE PLACEMENT;  Surgeon: Cleon Gustin, MD;  Location: [facility];  Service: Urology;  Laterality: Left;  . NO PAST SURGERIES     Family History  Problem Relation Age of Onset  . Cancer Paternal Grandfather        pancreatic  . Alcohol abuse Paternal Grandfather   . Arthritis Paternal Grandfather   . COPD Paternal Grandfather   . Early death Paternal Grandfather   . Cancer Paternal Grandmother        unknown, [place]  . Arthritis Paternal Grandmother   . COPD Paternal Grandmother   . Early death Paternal Grandmother   . Arthritis Father   . Mental retardation Father   . Arthritis Maternal Grandmother   . COPD Maternal Grandfather   . Heart disease Maternal Grandfather   . Cancer Sister 83       ovarian   Social History   Socioeconomic History  . Marital status: Married    Spouse name: Not on file  . Number of children: Not on file  . Years of education: Not on file  . Highest education level: Not on file  Occupational History  . Not on file  Social Needs  . Financial resource strain: Not on file  . Food insecurity:    Worry: Not on file    Inability: Not on file  . Transportation needs:    Medical: Not on file    Non-medical: Not on file  Tobacco Use  . Smoking status: Never Smoker  . Smokeless tobacco: Never Used  Substance and Sexual Activity  . Alcohol use: No  . Drug use: No  . Sexual activity: Yes    Birth control/protection: IUD    Comment: mirena 03/2013  Lifestyle  . Physical activity:    Days per week: Not on file    Minutes per session: Not on file  . Stress: Not on file  Relationships  . Social connections:    Talks on  phone: Not on file    Gets together: Not on file    Attends religious service: Not on file    Active member of club or organization: Not on file    Attends meetings of clubs or organizations: Not on file    Relationship status: Not on file  . Intimate partner violence:    Fear of current or ex partner: Not on file    Emotionally abused: Not on file    Physically abused: Not on file    Forced sexual activity: Not on file  Other Topics Concern  . Not on file  Social History Narrative   Married to [place]. 2 children [place] in [place].   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   Drink caffeinated beverages, wears her seatbelt, wears  a bike helmet.   Smoke detector in the home   Feels safe in her relationships   Allergies as of 09/15/2018   No Known Allergies     Medication List        Accurate as of 09/15/18 10:58 AM. Always use your most recent med list.          levonorgestrel 20 MCG/24HR IUD Commonly known as:  MIRENA 1 each once by Intrauterine route.   Naltrexone-buPROPion HCl ER 8-90 MG Tb12 1 tab daily for 1 week, then 1 tab every 12 hours for 1 week, then 2 tabs morning and 1 tab afternoon 1 week, then 2 tabs every 12 hours.       All past medical history, surgical history, allergies, family history, immunizations andmedications were updated in the EMR today and reviewed under the history and medication portions of their EMR.     Recent Results (from the past 2160 hour(s))  Cytology - PAP     Status: Abnormal   Collection Time: 06/18/18 12:00 AM  Result Value Ref Range   Adequacy      Satisfactory for evaluation  endocervical/transformation zone component PRESENT.   Diagnosis      NEGATIVE FOR INTRAEPITHELIAL LESIONS OR MALIGNANCY.   Chlamydia Negative     Comment: Normal Reference Range - Negative   Neisseria gonorrhea Negative     Comment: Normal Reference Range - Negative   HPV DETECTED (A)     Comment: Normal Reference Range - NOT  Detected   Material Submitted CervicoVaginal Pap [ThinPrep Imaged]    CYTOLOGY - PAP PAP RESULT     No results found.   ROS: 14 pt review of systems performed and negative (unless mentioned in an HPI)  Objective: BP 139/79 (BP Location: Left Arm, Patient Position: Sitting, Cuff Size: Large)   Pulse 82   Temp 98.4 F (36.9 C)   Resp 20   Ht _0  (1.676 m)   Wt (!) 438 lb 2 oz (198.7 kg)   SpO2 97%   BMI 70.72 kg/m  Gen: Afebrile. No acute distress. Nontoxic in appearance, well-developed, well-nourished,  morbidly obese female.  HENT: AT. [place]. Bilateral TM visualized and normal in appearance, normal external auditory canal. MMM, no oral lesions, adequate dentition. Bilateral nares within normal limits. Throat without erythema, ulcerations or exudates. no Cough on exam, no hoarseness on exam. Eyes:Pupils Equal Round Reactive to light, Extraocular movements intact,  Conjunctiva without redness, discharge or icterus. Neck/lymp/endocrine: Supple,no lymphadenopathy, no thyromegaly CV: RRR no murmur, no edema, +2/4 P posterior tibialis pulses. no carotid bruits. No JVD. Chest: CTAB, no wheeze, rhonchi or crackles. normal Respiratory effort. good Air movement. Abd: Soft. obese. NTND. BS present. Body habitus impairs exam.  Skin: no rashes, purpura or petechiae. Warm and well-perfused. Skin intact. Neuro/Msk:  Normal gait. PERLA. EOMi. Alert. Oriented x3.  Cranial nerves II through XII intact. Muscle strength 5/5 upper/lower extremity. DTRs equal bilaterally. Psych: Normal affect, dress and demeanor. Normal speech. Normal thought content and judgment.   No exam data present  Assessment/plan: Teresa Osborne is a 43 y.o. female present for CPE. Vitamin D deficiency - Vitamin D (25 hydroxy) Encounter for long-term (current) use of medications - Comp Met (CMET) Screening for iron deficiency anemia - CBC w/Diff Screening for diabetes mellitus - HgB A1c Elevated TSH Last level  elevated, no repeat completed - TSH BMI 60.0-69.9, adult (HCC) - Lipid panel Breast cancer screening - MM DIGITAL SCREENING BILATERAL; Future Class 3 severe  obesity due to excess calories with body mass index (BMI) greater than or equal to 70 in adult, unspecified whether serious comorbidity present (HCC) - > 10 min. Spent discussing plan, meds, diet and exercise. discussed restarting her weight loss program. She decided to medication also this time. We will Try to call in contrave- may need to try belviq depending on insurance. At the least will use plain Wellbutrin if nothing else is covered. Pt is agreeable with plan.  - she is to increase her exercise to 150 minutes a week. Avoid high glycemic index carbs- such as white flour, pasta, rice, sugar or white potatoes for now. Will plan follow up after labs received and she has time to mourn her grandmother that passed away this week - Naltrexone-buPROPion HCl ER 8-90 MG TB12; 1 tab daily for 1 week, then 1 tab every 12 hours for 1 week, then 2 tabs morning and 1 tab afternoon 1 week, then 2 tabs every 12 hours.  Dispense: 120 tablet; Refill: 2  Encounter for preventive health examination Patient was encouraged to exercise greater than 150 minutes a week. Patient was encouraged to choose a diet filled with fresh fruits and vegetables, and lean meats. AVS provided to patient today for education/recommendation on gender specific health and safety maintenance. Colonoscopy: No known family history of colon cancer. Routine screening at age 57. Mammogram: No known history of breast cancer in the family. Screening ordered at breast center pt di dnot have completed last year. Re-ordered for her.   Cervical cancer screening: [facility], [place]. Orpha Bur, NP. Last Pap 05/2018,3-5 yr f/u. Continue follow-up with gynecology. Immunizations: tdap updated 2018, declines flu shot today. Infectious disease screening: HIV 2008 DEXA: n/a  Return in  about 1 year (around 09/16/2019), or if symptoms worsen or fail to improve, for CPE.  Electronically signed by: Howard Pouch, DO [place]

## 2018-09-15 NOTE — Patient Instructions (Signed)
We will try one of the weight loss meds, let me know if it is not covered and we will try something else.  Cut back on carbohydrates.  I am sorry to hear about your grandmother.   Health Maintenance, Female Adopting a healthy lifestyle and getting preventive care can go a long way to promote health and wellness. Talk with your health care provider about what schedule of regular examinations is right for you. This is a good chance for you to check in with your provider about disease prevention and staying healthy. In between checkups, there are plenty of things you can do on your own. Experts have done a lot of research about which lifestyle changes and preventive measures are most likely to keep you healthy. Ask your health care provider for more information. Weight and diet Eat a healthy diet  Be sure to include plenty of vegetables, fruits, low-fat dairy products, and lean protein.  Do not eat a lot of foods high in solid fats, added sugars, or salt.  Get regular exercise. This is one of the most important things you can do for your health. ? Most adults should exercise for at least 150 minutes each week. The exercise should increase your heart rate and make you sweat (moderate-intensity exercise). ? Most adults should also do strengthening exercises at least twice a week. This is in addition to the moderate-intensity exercise.  Maintain a healthy weight  Body mass index (BMI) is a measurement that can be used to identify possible weight problems. It estimates body fat based on height and weight. Your health care provider can help determine your BMI and help you achieve or maintain a healthy weight.  For females 80 years of age and older: ? A BMI below 18.5 is considered underweight. ? A BMI of 18.5 to 24.9 is normal. ? A BMI of 25 to 29.9 is considered overweight. ? A BMI of 30 and above is considered obese.  Watch levels of cholesterol and blood lipids  You should start having your  blood tested for lipids and cholesterol at 42 years of age, then have this test every 5 years.  You may need to have your cholesterol levels checked more often if: ? Your lipid or cholesterol levels are high. ? You are older than 42 years of age. ? You are at high risk for heart disease.  Cancer screening Lung Cancer  Lung cancer screening is recommended for adults 27-47 years old who are at high risk for lung cancer because of a history of smoking.  A yearly low-dose CT scan of the lungs is recommended for people who: ? Currently smoke. ? Have quit within the past 15 years. ? Have at least a 30-pack-year history of smoking. A pack year is smoking an average of one pack of cigarettes a day for 1 year.  Yearly screening should continue until it has been 15 years since you quit.  Yearly screening should stop if you develop a health problem that would prevent you from having lung cancer treatment.  Breast Cancer  Practice breast self-awareness. This means understanding how your breasts normally appear and feel.  It also means doing regular breast self-exams. Let your health care provider know about any changes, no matter how small.  If you are in your 20s or 30s, you should have a clinical breast exam (CBE) by a health care provider every 1-3 years as part of a regular health exam.  If you are 40 or older, have  a CBE every year. Also consider having a breast X-ray (mammogram) every year.  If you have a family history of breast cancer, talk to your health care provider about genetic screening.  If you are at high risk for breast cancer, talk to your health care provider about having an MRI and a mammogram every year.  Breast cancer gene (BRCA) assessment is recommended for women who have family members with BRCA-related cancers. BRCA-related cancers include: ? Breast. ? Ovarian. ? Tubal. ? Peritoneal cancers.  Results of the assessment will determine the need for genetic  counseling and BRCA1 and BRCA2 testing.  Cervical Cancer Your health care provider may recommend that you be screened regularly for cancer of the pelvic organs (ovaries, uterus, and vagina). This screening involves a pelvic examination, including checking for microscopic changes to the surface of your cervix (Pap test). You may be encouraged to have this screening done every 3 years, beginning at age 60.  For women ages 51-65, health care providers may recommend pelvic exams and Pap testing every 3 years, or they may recommend the Pap and pelvic exam, combined with testing for human papilloma virus (HPV), every 5 years. Some types of HPV increase your risk of cervical cancer. Testing for HPV may also be done on women of any age with unclear Pap test results.  Other health care providers may not recommend any screening for nonpregnant women who are considered low risk for pelvic cancer and who do not have symptoms. Ask your health care provider if a screening pelvic exam is right for you.  If you have had past treatment for cervical cancer or a condition that could lead to cancer, you need Pap tests and screening for cancer for at least 20 years after your treatment. If Pap tests have been discontinued, your risk factors (such as having a new sexual partner) need to be reassessed to determine if screening should resume. Some women have medical problems that increase the chance of getting cervical cancer. In these cases, your health care provider may recommend more frequent screening and Pap tests.  Colorectal Cancer  This type of cancer can be detected and often prevented.  Routine colorectal cancer screening usually begins at 42 years of age and continues through 42 years of age.  Your health care provider may recommend screening at an earlier age if you have risk factors for colon cancer.  Your health care provider may also recommend using home test kits to check for hidden blood in the  stool.  A small camera at the end of a tube can be used to examine your colon directly (sigmoidoscopy or colonoscopy). This is done to check for the earliest forms of colorectal cancer.  Routine screening usually begins at age 25.  Direct examination of the colon should be repeated every 5-10 years through 42 years of age. However, you may need to be screened more often if early forms of precancerous polyps or small growths are found.  Skin Cancer  Check your skin from head to toe regularly.  Tell your health care provider about any new moles or changes in moles, especially if there is a change in a mole's shape or color.  Also tell your health care provider if you have a mole that is larger than the size of a pencil eraser.  Always use sunscreen. Apply sunscreen liberally and repeatedly throughout the day.  Protect yourself by wearing long sleeves, pants, a wide-brimmed hat, and sunglasses whenever you are outside.  Heart  disease, diabetes, and high blood pressure  High blood pressure causes heart disease and increases the risk of stroke. High blood pressure is more likely to develop in: ? People who have blood pressure in the high end of the normal range (130-139/85-89 mm Hg). ? People who are overweight or obese. ? People who are African American.  If you are 86-35 years of age, have your blood pressure checked every 3-5 years. If you are 73 years of age or older, have your blood pressure checked every year. You should have your blood pressure measured twice-once when you are at a hospital or clinic, and once when you are not at a hospital or clinic. Record the average of the two measurements. To check your blood pressure when you are not at a hospital or clinic, you can use: ? An automated blood pressure machine at a pharmacy. ? A home blood pressure monitor.  If you are between 28 years and 37 years old, ask your health care provider if you should take aspirin to prevent  strokes.  Have regular diabetes screenings. This involves taking a blood sample to check your fasting blood sugar level. ? If you are at a normal weight and have a low risk for diabetes, have this test once every three years after 42 years of age. ? If you are overweight and have a high risk for diabetes, consider being tested at a younger age or more often. Preventing infection Hepatitis B  If you have a higher risk for hepatitis B, you should be screened for this virus. You are considered at high risk for hepatitis B if: ? You were born in a country where hepatitis B is common. Ask your health care provider which countries are considered high risk. ? Your parents were born in a high-risk country, and you have not been immunized against hepatitis B (hepatitis B vaccine). ? You have HIV or AIDS. ? You use needles to inject street drugs. ? You live with someone who has hepatitis B. ? You have had sex with someone who has hepatitis B. ? You get hemodialysis treatment. ? You take certain medicines for conditions, including cancer, organ transplantation, and autoimmune conditions.  Hepatitis C  Blood testing is recommended for: ? Everyone born from [birth year] through 1965. ? Anyone with known risk factors for hepatitis C.  Sexually transmitted infections (STIs)  You should be screened for sexually transmitted infections (STIs) including gonorrhea and chlamydia if: ? You are sexually active and are younger than 42 years of age. ? You are older than 42 years of age and your health care provider tells you that you are at risk for this type of infection. ? Your sexual activity has changed since you were last screened and you are at an increased risk for chlamydia or gonorrhea. Ask your health care provider if you are at risk.  If you do not have HIV, but are at risk, it may be recommended that you take a prescription medicine daily to prevent HIV infection. This is called pre-exposure prophylaxis  (PrEP). You are considered at risk if: ? You are sexually active and do not regularly use condoms or know the HIV status of your partner(s). ? You take drugs by injection. ? You are sexually active with a partner who has HIV.  Talk with your health care provider about whether you are at high risk of being infected with HIV. If you choose to begin PrEP, you should first be tested for HIV. You  should then be tested every 3 months for as long as you are taking PrEP. Pregnancy  If you are premenopausal and you may become pregnant, ask your health care provider about preconception counseling.  If you may become pregnant, take 400 to 800 micrograms (mcg) of folic acid every day.  If you want to prevent pregnancy, talk to your health care provider about birth control (contraception). Osteoporosis and menopause  Osteoporosis is a disease in which the bones lose minerals and strength with aging. This can result in serious bone fractures. Your risk for osteoporosis can be identified using a bone density scan.  If you are 69 years of age or older, or if you are at risk for osteoporosis and fractures, ask your health care provider if you should be screened.  Ask your health care provider whether you should take a calcium or vitamin D supplement to lower your risk for osteoporosis.  Menopause may have certain physical symptoms and risks.  Hormone replacement therapy may reduce some of these symptoms and risks. Talk to your health care provider about whether hormone replacement therapy is right for you. Follow these instructions at home:  Schedule regular health, dental, and eye exams.  Stay current with your immunizations.  Do not use any tobacco products including cigarettes, chewing tobacco, or electronic cigarettes.  If you are pregnant, do not drink alcohol.  If you are breastfeeding, limit how much and how often you drink alcohol.  Limit alcohol intake to no more than 1 drink per day for  nonpregnant women. One drink equals 12 ounces of beer, 5 ounces of wine, or 1 ounces of hard liquor.  Do not use street drugs.  Do not share needles.  Ask your health care provider for help if you need support or information about quitting drugs.  Tell your health care provider if you often feel depressed.  Tell your health care provider if you have ever been abused or do not feel safe at home. This information is not intended to replace advice given to you by your health care provider. Make sure you discuss any questions you have with your health care provider. Document Released: 06/02/2011 Document Revised: 04/24/2016 Document Reviewed: 08/21/2015 Elsevier Interactive Patient Education  [employer].

## 2018-09-16 ENCOUNTER — Telehealth: Payer: Self-pay | Admitting: Family Medicine

## 2018-09-16 LAB — CBC WITH DIFFERENTIAL/PLATELET
BASOS ABS: 0 10*3/uL (ref 0.0–0.2)
Basos: 1 %
EOS (ABSOLUTE): 0.2 10*3/uL (ref 0.0–0.4)
Eos: 4 %
Hematocrit: 40.4 % (ref 34.0–46.6)
Hemoglobin: 13.7 g/dL (ref 11.1–15.9)
Immature Grans (Abs): 0 10*3/uL (ref 0.0–0.1)
Immature Granulocytes: 0 %
LYMPHS ABS: 1.6 10*3/uL (ref 0.7–3.1)
Lymphs: 28 %
MCH: 30.8 pg (ref 26.6–33.0)
MCHC: 33.9 g/dL (ref 31.5–35.7)
MCV: 91 fL (ref 79–97)
MONOS ABS: 0.4 10*3/uL (ref 0.1–0.9)
Monocytes: 8 %
NEUTROS PCT: 59 %
Neutrophils Absolute: 3.4 10*3/uL (ref 1.4–7.0)
Platelets: 348 10*3/uL (ref 150–450)
RBC: 4.45 x10E6/uL (ref 3.77–5.28)
RDW: 13.5 % (ref 12.3–15.4)
WBC: 5.7 10*3/uL (ref 3.4–10.8)

## 2018-09-16 LAB — HEMOGLOBIN A1C
Est. average glucose Bld gHb Est-mCnc: 108 mg/dL
HEMOGLOBIN A1C: 5.4 % (ref 4.8–5.6)

## 2018-09-16 LAB — COMPREHENSIVE METABOLIC PANEL
ALBUMIN: 4 g/dL (ref 3.5–5.5)
ALT: 14 IU/L (ref 0–32)
AST: 12 IU/L (ref 0–40)
Albumin/Globulin Ratio: 1.3 (ref 1.2–2.2)
Alkaline Phosphatase: 80 IU/L (ref 39–117)
BILIRUBIN TOTAL: 0.3 mg/dL (ref 0.0–1.2)
BUN / CREAT RATIO: 12 (ref 9–23)
BUN: 11 mg/dL (ref 6–24)
CHLORIDE: 105 mmol/L (ref 96–106)
CO2: 20 mmol/L (ref 20–29)
CREATININE: 0.92 mg/dL (ref 0.57–1.00)
Calcium: 9.1 mg/dL (ref 8.7–10.2)
GFR calc non Af Amer: 77 mL/min/{1.73_m2} (ref >59)
GFR, EST AFRICAN AMERICAN: 89 mL/min/{1.73_m2} (ref >59)
GLOBULIN, TOTAL: 3.1 g/dL (ref 1.5–4.5)
Glucose: 82 mg/dL (ref 65–99)
Potassium: 4.6 mmol/L (ref 3.5–5.2)
SODIUM: 141 mmol/L (ref 134–144)
TOTAL PROTEIN: 7.1 g/dL (ref 6.0–8.5)

## 2018-09-16 LAB — VITAMIN D 25 HYDROXY (VIT D DEFICIENCY, FRACTURES): VIT D 25 HYDROXY: 13.6 ng/mL — AB (ref 30.0–100.0)

## 2018-09-16 LAB — LIPID PANEL
CHOL/HDL RATIO: 3.4 ratio (ref 0.0–4.4)
Cholesterol, Total: 178 mg/dL (ref 100–199)
HDL: 53 mg/dL (ref >39)
LDL CALC: 110 mg/dL — AB (ref 0–99)
Triglycerides: 77 mg/dL (ref 0–149)
VLDL Cholesterol Cal: 15 mg/dL (ref 5–40)

## 2018-09-16 LAB — TSH: TSH: 3.93 u[IU]/mL (ref 0.450–4.500)

## 2018-09-16 MED ORDER — VITAMIN D (ERGOCALCIFEROL) 1.25 MG (50000 UNIT) PO CAPS: 50000.0000 [IU] | ORAL_CAPSULE | ORAL | 0 refills | Status: DC

## 2018-09-16 NOTE — Telephone Encounter (Signed)
Please inform patient the following information: Her labs are normal with the exception of her vit d is extremely low at 13. I have called in the once a week Vit D and she is to start OTC 2000u daily with food during and continuing after her prescribed vit D.  I have called in the contrave as well for weight loss regimen. Please ask her if this is affordable/went through insurance etc? If not, is belviq covered or another agent?  Follow up in 4 weeks after starting medication, dietary changes discussed. Also recommend she start a form of exercise she can enjoy and taper up to 150 minutes of that exercise by the time she follows up in 4 weeks. We will then start discussing calorie counting also at that visit.

## 2018-09-16 NOTE — Telephone Encounter (Signed)
Mailbox full and unable to leave message, will try to call back at later time.

## 2018-09-17 NOTE — Telephone Encounter (Signed)
Patient notified and verbalized understanding. 

## 2018-09-17 NOTE — Telephone Encounter (Signed)
Message left with husband for patient to return call.

## 2018-11-02 ENCOUNTER — Ambulatory Visit: Payer: Self-pay | Admitting: *Deleted

## 2018-11-02 NOTE — Telephone Encounter (Signed)
Pt called with upper respiratory symptoms that started on the Wednesday before Thanksgiving. Wednesday she had a sore throat and on Saturday started with a cough and runny nose. She feels achy and feverish.  She gets short of breath a times with cough and can hear herself wheeze when she lays down.  No history of respiratory illnesses.,  She is taking otc cough meds, and Advil. She thinks she is running a fever (feels feverish) but can not check her temp at work.  She is drinking lots of fluids,orange juice and water.  She is requesting a call back regarding prescriptions for congestion. No appointment availability with her pcp today. Routing to flow at [facility] at [facility]. Advised to call back for increased in symptoms. Pt voiced understanding.  Reason for Disposition . Cough with cold symptoms (e.g., runny nose, postnasal drip, throat clearing)  Answer Assessment - Initial Assessment Questions 1. ONSET: "When did the cough begin?"      Saturday 2. SEVERITY: "How bad is the cough today?"      Coughing a lot 3. RESPIRATORY DISTRESS: "Describe your breathing."       can hear herself wheeze 4. FEVER: "Do you have a fever?" If so, ask: "What is your temperature, how was it measured, and when did it start?"     Fell a little feverish, can not check temp 5. HEMOPTYSIS: "Are you coughing up any blood?" If so ask: "How much?" (flecks, streaks, tablespoons, etc.)     no 6. TREATMENT: "What have you done so far to treat the cough?" (e.g., meds, fluids, humidifier)     Cough meds over the counter, fluids 7. CARDIAC HISTORY: "Do you have any history of heart disease?" (e.g., heart attack, congestive heart failure)      no 8. LUNG HISTORY: "Do you have any history of lung disease?"  (e.g., pulmonary embolus, asthma, emphysema)     no 9. PE RISK FACTORS: "Do you have a history of blood clots?" (or: recent major surgery, recent prolonged travel, bedridden)     no 10. OTHER SYMPTOMS: "Do you have any  other symptoms? (e.g., runny nose, wheezing, chest pain)       Wheezing, runny nose 11. PREGNANCY: "Is there any chance you are pregnant?" "When was your last menstrual period?"       Don't think so. Using Mirena 12. TRAVEL: "Have you traveled out of the country in the last month?" (e.g., travel history, exposures)       no  Protocols used: COUGH - ACUTE NON-PRODUCTIVE-A-AH

## 2018-11-02 NOTE — Telephone Encounter (Signed)
Returned call to patient, message left telling patient to return call to schedule an appointment for one day this week if she wants to be seen.

## 2019-06-14 ENCOUNTER — Other Ambulatory Visit: Payer: Self-pay

## 2019-06-14 ENCOUNTER — Ambulatory Visit: Payer: Managed Care, Other (non HMO) | Admitting: Family Medicine

## 2019-06-14 ENCOUNTER — Encounter: Payer: Self-pay | Admitting: Family Medicine

## 2019-06-14 ENCOUNTER — Ambulatory Visit
Admission: RE | Admit: 2019-06-14 | Discharge: 2019-06-14 | Disposition: A | Discharge status: Home / Self Care | Payer: Managed Care, Other (non HMO) | Source: Ambulatory Visit | Attending: Family Medicine | Admitting: Family Medicine

## 2019-06-14 VITALS — BP 128/84 | HR 109 | Temp 97.8°F | Resp 18 | Ht 66.0 in | Wt >= 6400 oz

## 2019-06-14 DIAGNOSIS — M25562 Pain in left knee: Secondary | ICD-10-CM | POA: Diagnosis not present

## 2019-06-14 DIAGNOSIS — E559 Vitamin D deficiency, unspecified: Secondary | ICD-10-CM

## 2019-06-14 DIAGNOSIS — Z6841 Body Mass Index (BMI) 40.0 and over, adult: Secondary | ICD-10-CM | POA: Diagnosis not present

## 2019-06-14 MED ORDER — VITAMIN D (ERGOCALCIFEROL) 1.25 MG (50000 UNIT) PO CAPS: 50000.0000 [IU] | ORAL_CAPSULE | ORAL | 0 refills | Status: DC

## 2019-06-14 MED ORDER — NAPROXEN 500 MG PO TABS: 500.0000 mg | ORAL_TABLET | Freq: Two times a day (BID) | ORAL | 0 refills | Status: DC

## 2019-06-14 NOTE — Patient Instructions (Signed)
Start the vit d once weekly prescribed dose and OTC vit d 2000u daily.   Please have xray completed at [facility] [address]. They will call you to schedule.   Start naproxen every 12 hours with food for pain.    Patellar Tendinitis  Patellar tendinitis is also called jumper's knee or patellar tendinopathy. This condition happens when there is damage to and inflammation of the patellar tendon. Tendons are cord-like tissues that connect muscles to bones. The patellar tendon connects the bottom of the kneecap (patella) to the top of the shin bone (tibia). Patellar tendinitis causes pain in the front of the knee. The condition happens in the following stages:  Stage 1: In this stage, you have pain only after activity.  Stage 2: In this stage, you have pain during and after activity.  Stage 3: In this stage, you have pain at rest as well as during and after activity. The pain limits your ability to do the activity.  Stage 4: In this stage, the tendon tears and severely limits your activity. What are the causes? This condition is caused by repeated (repetitive) stress on the tendon. This stress may cause the tendon to stretch, swell, thicken, or tear. What increases the risk? The following factors may make you more likely to develop this condition:  Participating in sports that involve running, kicking, and jumping, especially on hard surfaces. These include: ? Basketball. ? Volleyball. ? Soccer. ? Track and field.  Training too hard.  Having tight thigh muscles.  Having received steroid injections in the tendon.  Having had knee surgery.  Being 1416-345 years old.  Having rheumatoid arthritis, diabetes, or kidney disease. These conditions interrupt blood flow to the knee, causing the tendon to weaken. What are the signs or symptoms? The main symptom of this condition is pain and swelling in the front of the knee. The pain usually starts slowly and gradually gets worse. It  may become painful to straighten your leg. The pain may get worse when you walk, run, or jump. How is this diagnosed? This condition may be diagnosed based on:  Your symptoms.  Your medical history.  A physical exam. During the physical exam, your health care provider may check for: ? Tenderness in your patella. ? Tightness in your thigh muscles. ? Pain when you straighten your knee.  Imaging tests, including: ? X-rays. These will show the position and condition of your patella. ? An MRI. This will show any abnormality of the tendon. ? Ultrasound. This will show any swelling or other abnormalities of the tendon. How is this treated? Treatment for this condition depends on the stage of the condition. It may involve:  Avoiding activities that cause pain, such as jumping.  Icing and elevating your knee.  Having sound wave stimulation to promote healing.  Doing stretching and strengthening exercises (physical therapy) when pain and swelling improve.  Wearing a knee brace. This may be needed if your condition does not improve with treatment.  Using crutches or a walker. This may be needed if your condition does not improve with treatment.  Surgery. This may be done if you have stage 4 tendinitis. Follow these instructions at home: If you have a brace:  Wear the brace as told by your health care provider. Remove it only as told by your health care provider.  Loosen the brace if your toes tingle, become numb, or turn cold and blue.  Keep the brace clean.  If the brace is not waterproof: ?  Do not let it get wet. ? Cover it with a watertight covering when you take a bath or shower.  Ask your health care provider when it is safe for you to drive. Managing pain, stiffness, and swelling   If directed, put ice on the injured area. ? If you have a removable brace, remove it as told by your health care provider. ? Put ice in a plastic bag. ? Place a towel between your skin and  the bag. ? Leave the ice on for 20 minutes, 2-3 times a day.  Move your toes often to reduce stiffness and swelling.  Raise (elevate) your knee above the level of your heart while you are sitting or lying down. Activity  Do not use the injured limb to support your body weight until your health care provider says that you can. Use your crutches or a walker as told by your health care provider.  Return to your normal activities as told by your health care provider. Ask your health care provider what activities are safe for you.  Do exercises as told by your health care provider or physical therapist. General instructions  Take over-the-counter and prescription medicines only as told by your health care provider.  Do not use any products that contain nicotine or tobacco, such as cigarettes, e-cigarettes, and chewing tobacco. These can delay healing. If you need help quitting, ask your health care provider.  Keep all follow-up visits as told by your health care provider. This is important. How is this prevented?  Warm up and stretch before being active.  Cool down and stretch after being active.  Give your body time to rest between periods of activity. ? You may need to reduce how often you play a sport that requires frequent jumping.  Make sure to use equipment that fits you.  Be safe and responsible while being active. This will help you avoid falls which can damage the tendon.  Do at least 150 minutes of moderate-intensity exercise each week, such as brisk walking or water aerobics.  Maintain physical fitness, including: ? Strength. ? Flexibility. ? Cardiovascular fitness. ? Endurance. Contact a health care provider if:  Your symptoms have not improved in 6 weeks.  Your symptoms get worse. Summary  Patellar tendinitis is also called jumper's knee or patellar tendinopathy. This condition happens when there is damage to and inflammation of the patellar tendon.  Treatment  for this condition depends on the stage of the condition and may include rest, ice, exercises, medicines, and surgery.  Do not use the injured limb to support your body weight until your health care provider says that you can.  Take over-the-counter and prescription medicines only as told by your health care provider.  Keep all follow-up visits as told by your health care provider. This is important. This information is not intended to replace advice given to you by your health care provider. Make sure you discuss any questions you have with your health care provider. Document Released: 11/17/2005 Document Revised: 03/10/2019 Document Reviewed: 10/11/2018 Elsevier Patient Education  2020 [employer].

## 2019-06-14 NOTE — Progress Notes (Signed)
Teresa Osborne , [birth year], 43 y.o., female MRN: [id number] Patient Care Team    Relationship Specialty Notifications Start End  Ma Hillock, DO PCP - General Family Medicine  01/09/16     Chief Complaint  Patient presents with  . Knee Pain    Pt complaints of left knee pain x3 weeks ago. Pt was cleaning and when she stood up she heard a pop. Pain is off and on since.   . vitamin D    Pt would like to start Vit D due to prior labs being low      Subjective: Pt presents for an OV with complaints of left knee pain of 3 weeks  duration.  Associated symptoms include She was cleaning behind her toilet (down on her knees)  when she first felt pain in her knee. She was later that day folding clothes and when she stepped her knee "popped". Initially she had increased pain when bearing weight and had mild swelling, which has resolved. She has had persistent discomfort in her knee since. Pain is now worse upon waking and after sitting for long periods. It is improved after moving for while.  She has had no prior knee injuries or surgeries.  Pt has tried advil and ice to ease their symptoms.   Vit D def: Vit d level 13.6 08/2018. She was provided with once weekly high dose supplement and told to take 2000 u OTC.   Depression screen [facility] 2/9 09/15/2018 09/14/2017 09/11/2017 01/09/2016  Decreased Interest 0 0 0 0  Down, Depressed, Hopeless 1 0 0 0  PHQ - 2 Score 1 0 0 0  Altered sleeping 0 - - -  Tired, decreased energy 1 - - -  Change in appetite 1 - - -  Feeling bad or failure about yourself  1 - - -  Trouble concentrating 0 - - -  Moving slowly or fidgety/restless 0 - - -  Suicidal thoughts 0 - - -  PHQ-9 Score 4 - - -  Difficult doing work/chores Not difficult at all - - -    No Known Allergies Social History   Social History Narrative   Married to [place]. 2 children [place] in [place].   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   Drink caffeinated beverages,  wears her seatbelt, wears a bike helmet.   Smoke detector in the home   Feels safe in her relationships   Past Medical History:  Diagnosis Date  . Anxiety   . History of kidney stones   . Kidney stones    [place]  . PONV (postoperative nausea and vomiting)    NOV 2018, JAN TREMORS LASTED ABOUT 15 MINUTES, LIKES [place]; no issues wuth last kidney stone surgery   . Pre-diabetes   . Vaginal Pap smear, abnormal    Past Surgical History:  Procedure Laterality Date  . CYSTOSCOPY  10/27/2017   Procedure: CYSTOSCOPY FLEXIBLE;  Surgeon: Cleon Gustin, MD;  Location: [facility];  Service: Urology;;  . Consuela Mimes WITH RETROGRADE PYELOGRAM, URETEROSCOPY AND STENT PLACEMENT Left 01/21/2018   Procedure: [place], URETEROSCOPY AND STENT PLACEMENT;  Surgeon: Cleon Gustin, MD;  Location: [facility];  Service: Urology;  Laterality: Left;  . HOLMIUM LASER APPLICATION Left 9/47/6546   Procedure: HOLMIUM LASER APPLICATION;  Surgeon: Cleon Gustin, MD;  Location: [facility];  Service: Urology;  Laterality: Left;  . IR URETERAL STENT LEFT NEW ACCESS W/O SEP NEPHROSTOMY CATH  02/18/2018  .  NEPHROLITHOTOMY Left 12/07/2017   Procedure: LEFT NEPHROLITHOTOMY PERCUTANEOUS AND STENT PLACEMENT;  Surgeon: Malen GauzeMcKenzie, Patrick L, MD;  Location: [facility];  Service: Urology;  Laterality: Left;  . NEPHROLITHOTOMY Left 02/19/2018   Procedure: NEPHROLITHOTOMY PERCUTANEOUS;  Surgeon: Malen GauzeMcKenzie, Patrick L, MD;  Location: [facility];  Service: Urology;  Laterality: Left;  . NEPHROLITHOTOMY Left 02/25/2018   Procedure: NEPHROLITHOTOMY PERCUTANEOUS SECOND LOOK;  Surgeon: Malen GauzeMcKenzie, Patrick L, MD;  Location: [facility];  Service: Urology;  Laterality: Left;  . NEPHROSTOMY Left 10/27/2017   Procedure: NEPHROSTOMY TUBE PLACEMENT;  Surgeon: Malen GauzeMcKenzie, Patrick L, MD;  Location: [facility];  Service: Urology;  Laterality: Left;  . NO PAST SURGERIES     Family History  Problem Relation Age of Onset  . Cancer  Paternal Grandfather        pancreatic  . Alcohol abuse Paternal Grandfather   . Arthritis Paternal Grandfather   . COPD Paternal Grandfather   . Early death Paternal Grandfather   . Cancer Paternal Grandmother        unknown, [place]  . Arthritis Paternal Grandmother   . COPD Paternal Grandmother   . Early death Paternal Grandmother   . Arthritis Father   . Mental retardation Father   . Arthritis Maternal Grandmother   . COPD Maternal Grandfather   . Heart disease Maternal Grandfather   . Cancer Sister [id number]       ovarian   Allergies as of 06/14/2019   No Known Allergies     Medication List       Accurate as of June 14, 2019  9:07 AM. If you have any questions, ask your nurse or doctor.        levonorgestrel 20 MCG/24HR IUD Commonly known as: MIRENA 1 each once by Intrauterine route.   Naltrexone-buPROPion HCl ER 8-90 MG Tb12 1 tab daily for 1 week, then 1 tab every 12 hours for 1 week, then 2 tabs morning and 1 tab afternoon 1 week, then 2 tabs every 12 hours.   Vitamin D (Ergocalciferol) 1.25 MG (50000 UT) Caps capsule Commonly known as: DRISDOL Take 1 capsule (50,000 Units total) by mouth every 7 (seven) days.       All past medical history, surgical history, allergies, family history, immunizations andmedications were updated in the EMR today and reviewed under the history and medication portions of their EMR.     ROS: Negative, with the exception of above mentioned in HPI   Objective:  There were no vitals taken for this visit. There is no height or weight on file to calculate BMI. Gen: Afebrile. No acute distress. Nontoxic in appearance, well developed, well nourished. Obese.  HENT: AT. [place]. MMM Eyes:Pupils Equal Round Reactive to light, Extraocular movements intact,  Conjunctiva without redness, discharge or icterus. MSK: No erythema, no obvious soft tissue swelling. No TTP. Discomfort with extension of knee. Exam very difficult d/t body habitus. NV intact  distally.  Skin: No rashes, purpura or petechiae.  Neuro: Normal gait. PERLA. EOMi. Alert. Oriented x3  Psych: Normal affect, dress and demeanor. Normal speech. Normal thought content and judgment.  No exam data present No results found. No results found for this or any previous visit (from the past 24 hour(s)).  Assessment/Plan: Teresa Osborne is a 43 y.o. female present for OV for  BMI 70 and over, adult (HCC) Continue swimming and diet modifications.  Vitamin D deficiency Prescribed vit d  50K weekly. X 12 weeks She was also told to take vit d OTC 2000u  daily.  Will retest at her CPE which is coming up around that time.  Acute pain of left knee - exam and HPI consistent With patellar tendon etiology. Will obtain xray today to further evaluate since pain > 3 weeks now.  - start naproxen BID with food - DG Knee Complete 4 Views Left; Future - F/u dependent on xray result.    Reviewed expectations re: course of current medical issues.  Discussed self-management of symptoms.  Outlined signs and symptoms indicating need for more acute intervention.  Patient verbalized understanding and all questions were answered.  Patient received an After-Visit Summary.   > 25 minutes spent with patient, >50% of time spent face to face    No orders of the defined types were placed in this encounter.    Note is dictated utilizing voice recognition software. Although note has been proof read prior to signing, occasional typographical errors still can be missed. If any questions arise, please do not hesitate to call for verification.   electronically signed by:  Felix Pacinienee Yolandra Habig, DO  [facility] - [place]

## 2019-07-22 IMAGING — CR LEFT KNEE - COMPLETE 4+ VIEW
4 series · 4 of 4 positions shown · non-contrast
Comparison: None.

CLINICAL DATA: Left anterior knee pain, swelling

EXAM:
LEFT KNEE - COMPLETE 4+ VIEW

[t knee ap left (1 of 4)]
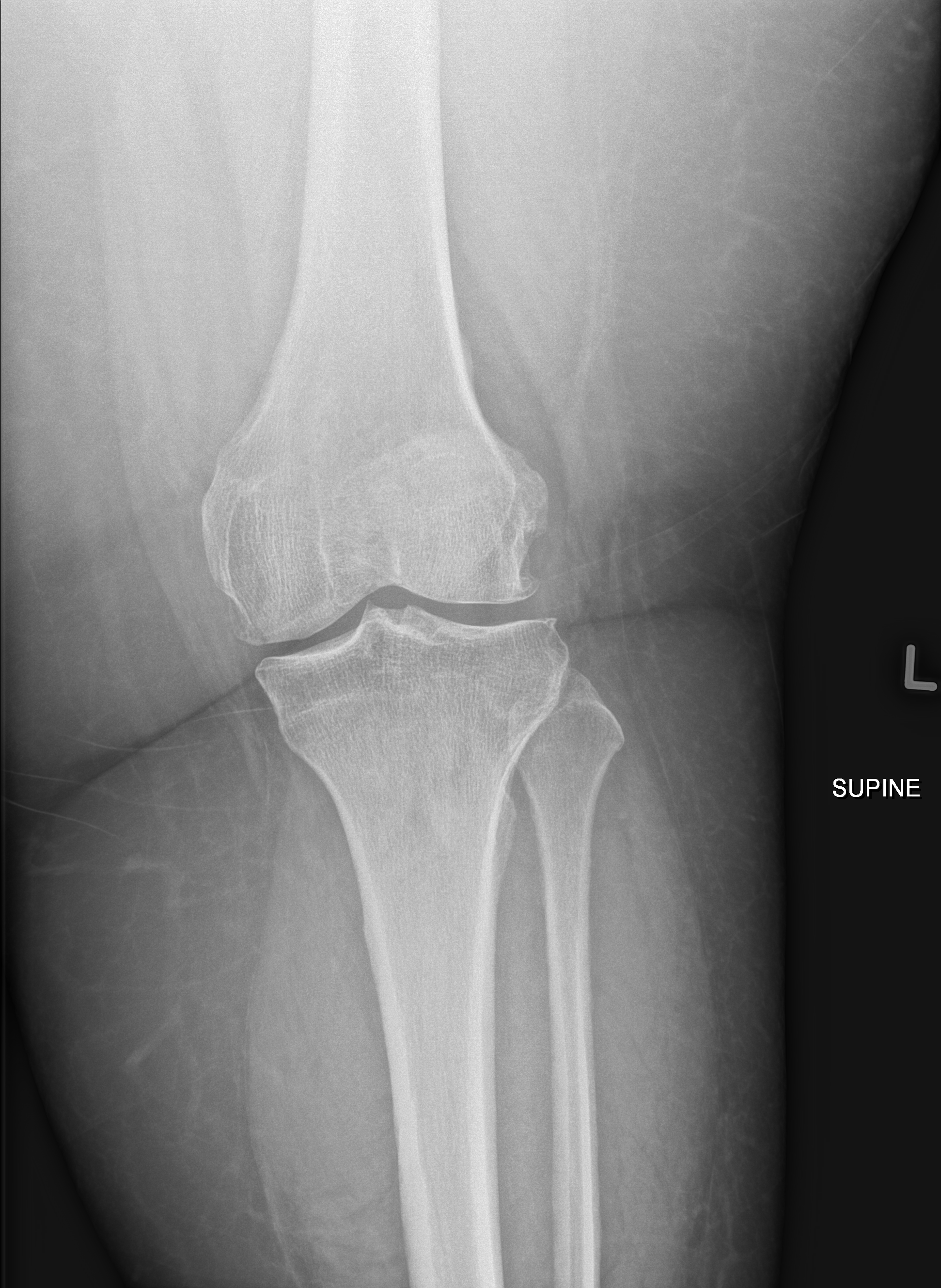

[t knee ap left (2 of 4)]
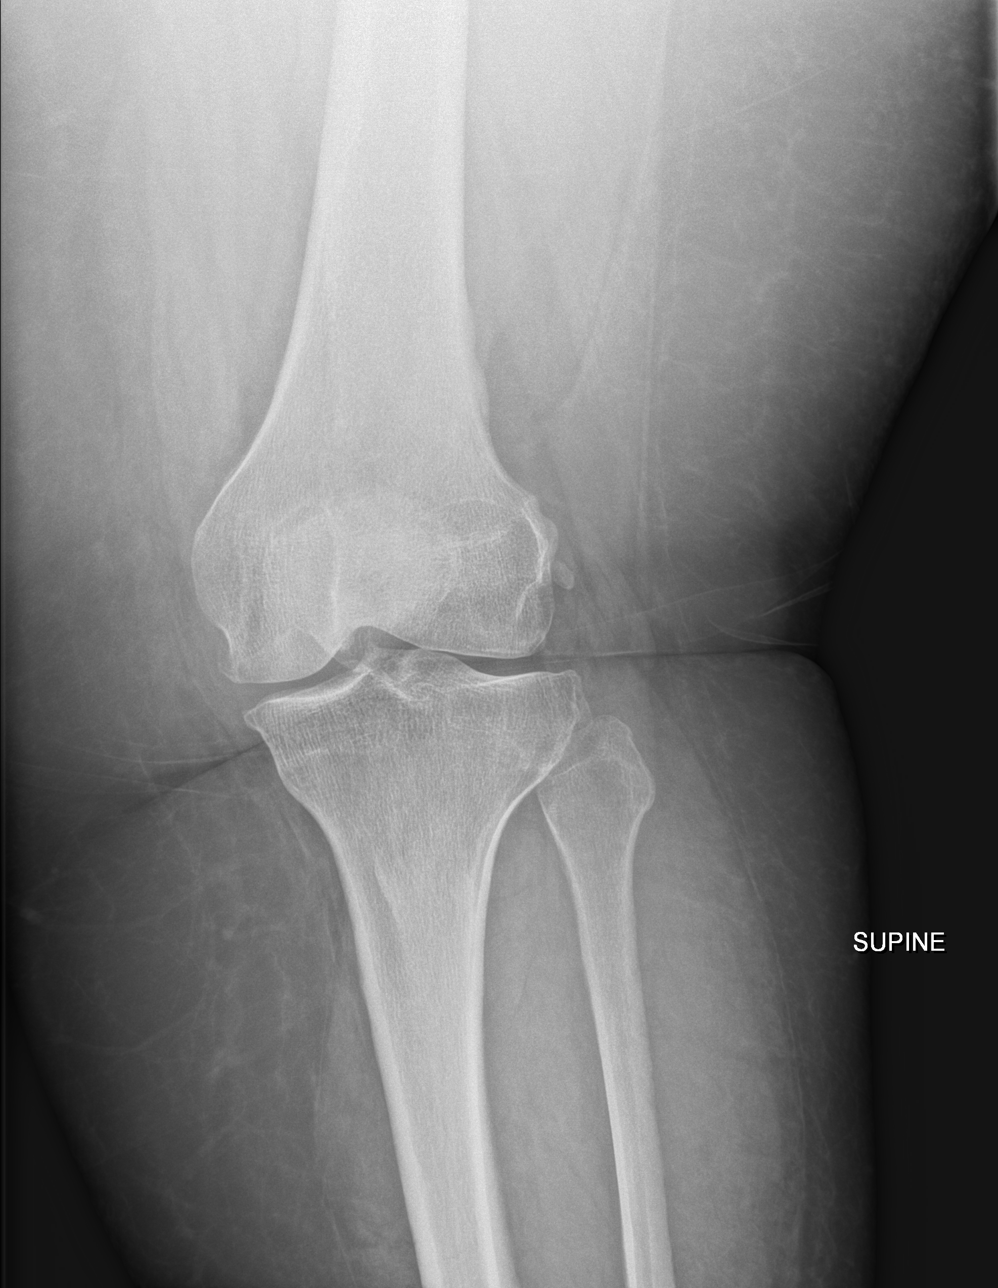

[t knee ap left (3 of 4)]
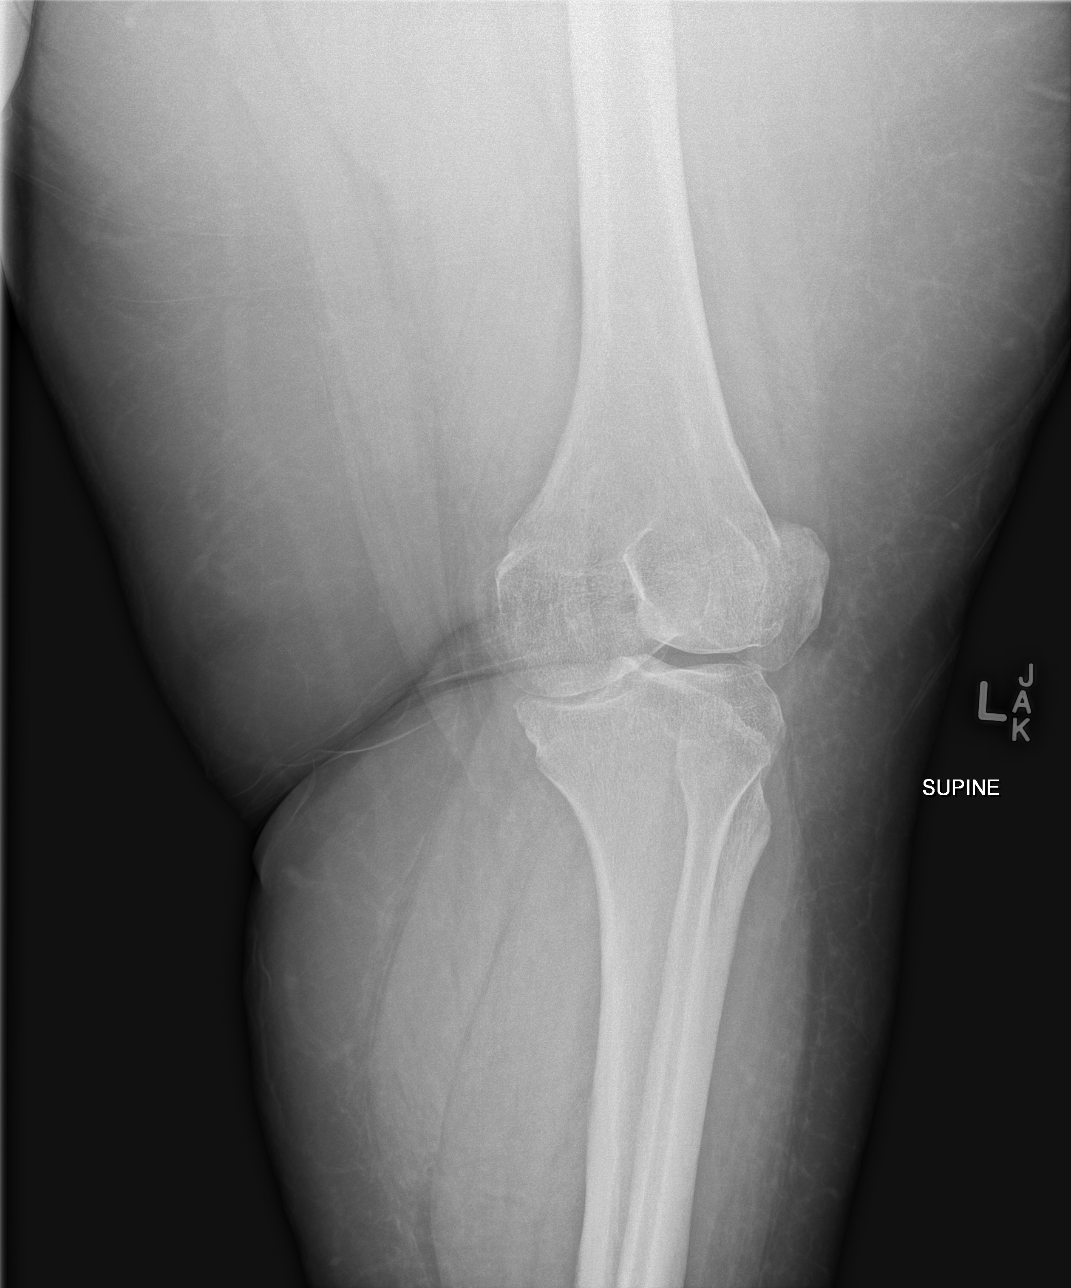

[t knee ap left (4 of 4)]
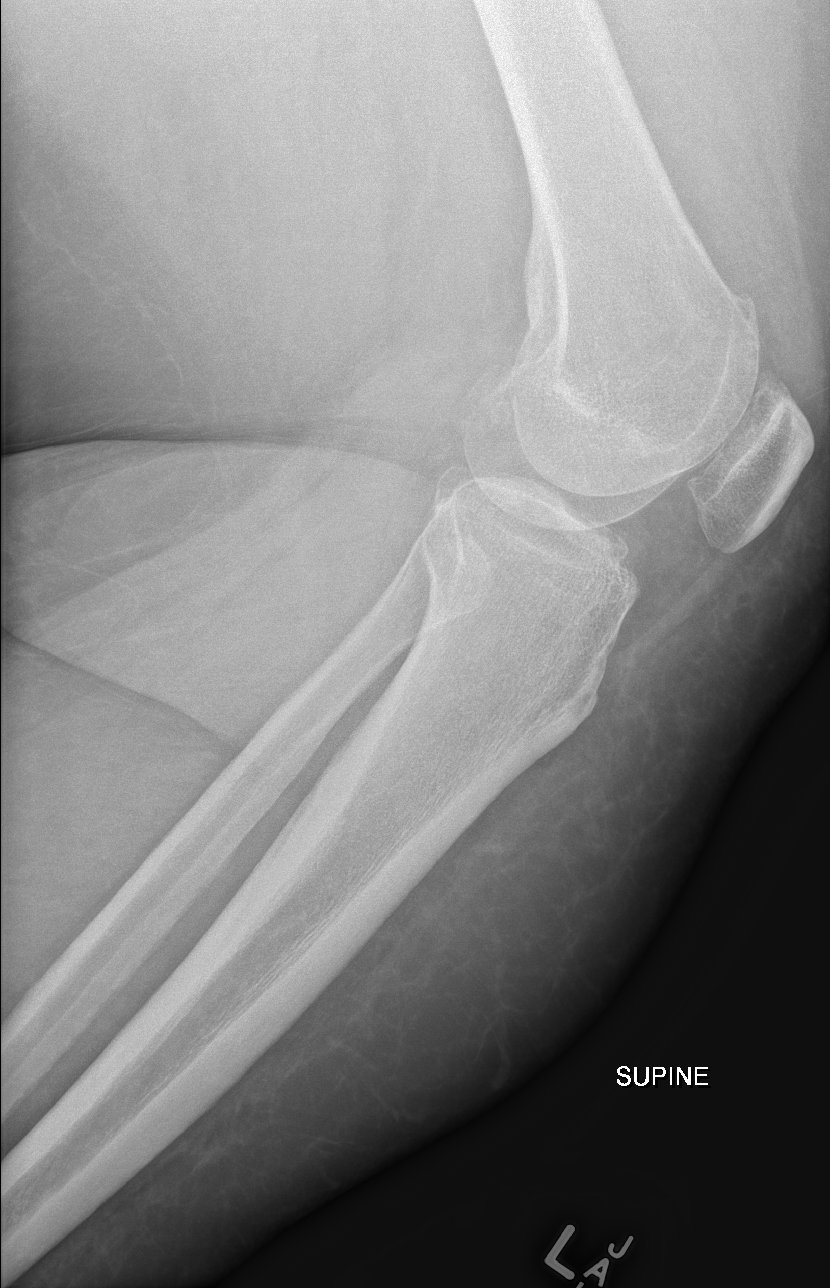

[4 of 4 positions shown; findings below may reference images not displayed]

FINDINGS: Moderate tricompartment degenerative changes with joint space
narrowing and spurring. No acute bony abnormality. Specifically, no
fracture, subluxation, or dislocation. No joint effusion.
IMPRESSION: Moderate tricompartment degenerative changes. No acute bony
abnormality.

## 2019-09-23 ENCOUNTER — Encounter: Payer: Self-pay | Admitting: Family Medicine

## 2019-09-23 ENCOUNTER — Ambulatory Visit (INDEPENDENT_AMBULATORY_CARE_PROVIDER_SITE_OTHER): Payer: Managed Care, Other (non HMO) | Admitting: Family Medicine

## 2019-09-23 ENCOUNTER — Other Ambulatory Visit: Payer: Self-pay

## 2019-09-23 VITALS — BP 138/80 | HR 89 | Temp 97.2°F | Resp 19 | Ht 65.75 in | Wt >= 6400 oz

## 2019-09-23 DIAGNOSIS — Z713 Dietary counseling and surveillance: Secondary | ICD-10-CM | POA: Diagnosis not present

## 2019-09-23 DIAGNOSIS — M1710 Unilateral primary osteoarthritis, unspecified knee: Secondary | ICD-10-CM | POA: Diagnosis not present

## 2019-09-23 DIAGNOSIS — Z1231 Encounter for screening mammogram for malignant neoplasm of breast: Secondary | ICD-10-CM

## 2019-09-23 DIAGNOSIS — Z0001 Encounter for general adult medical examination with abnormal findings: Secondary | ICD-10-CM | POA: Diagnosis not present

## 2019-09-23 DIAGNOSIS — R7303 Prediabetes: Secondary | ICD-10-CM | POA: Diagnosis not present

## 2019-09-23 DIAGNOSIS — R7309 Other abnormal glucose: Secondary | ICD-10-CM

## 2019-09-23 DIAGNOSIS — Z6841 Body Mass Index (BMI) 40.0 and over, adult: Secondary | ICD-10-CM | POA: Diagnosis not present

## 2019-09-23 DIAGNOSIS — M1712 Unilateral primary osteoarthritis, left knee: Secondary | ICD-10-CM

## 2019-09-23 DIAGNOSIS — Z130 Encounter for screening for diseases of the blood and blood-forming organs and certain disorders involving the immune mechanism: Secondary | ICD-10-CM | POA: Diagnosis not present

## 2019-09-23 DIAGNOSIS — E559 Vitamin D deficiency, unspecified: Secondary | ICD-10-CM

## 2019-09-23 DIAGNOSIS — R635 Abnormal weight gain: Secondary | ICD-10-CM | POA: Diagnosis not present

## 2019-09-23 DIAGNOSIS — Z79899 Other long term (current) drug therapy: Secondary | ICD-10-CM

## 2019-09-23 LAB — CBC
HCT: 41 % (ref 36.0–46.0)
Hemoglobin: 13.7 g/dL (ref 12.0–15.0)
MCHC: 33.5 g/dL (ref 30.0–36.0)
MCV: 91.4 fl (ref 78.0–100.0)
Platelets: 311 10*3/uL (ref 150.0–400.0)
RBC: 4.49 Mil/uL (ref 3.87–5.11)
RDW: 13.9 % (ref 11.5–15.5)
WBC: 7.1 10*3/uL (ref 4.0–10.5)

## 2019-09-23 LAB — COMPREHENSIVE METABOLIC PANEL
ALT: 16 U/L (ref 0–35)
AST: 13 U/L (ref 0–37)
Albumin: 3.9 g/dL (ref 3.5–5.2)
Alkaline Phosphatase: 69 U/L (ref 39–117)
BUN: 13 mg/dL (ref 6–23)
CO2: 26 mEq/L (ref 19–32)
Calcium: 9 mg/dL (ref 8.4–10.5)
Chloride: 104 mEq/L (ref 96–112)
Creatinine, Ser: 0.99 mg/dL (ref 0.40–1.20)
GFR: 61.04 mL/min (ref >60.00)
Glucose, Bld: 101 mg/dL — ABNORMAL HIGH (ref 70–99)
Potassium: 4.8 mEq/L (ref 3.5–5.1)
Sodium: 138 mEq/L (ref 135–145)
Total Bilirubin: 0.4 mg/dL (ref 0.2–1.2)
Total Protein: 7.1 g/dL (ref 6.0–8.3)

## 2019-09-23 LAB — LIPID PANEL
Cholesterol: 193 mg/dL (ref 0–200)
HDL: 51.7 mg/dL (ref >39.00)
LDL Cholesterol: 122 mg/dL — ABNORMAL HIGH (ref 0–99)
NonHDL: 141.27
Total CHOL/HDL Ratio: 4
Triglycerides: 96 mg/dL (ref 0.0–149.0)
VLDL: 19.2 mg/dL (ref 0.0–40.0)

## 2019-09-23 LAB — T4, FREE: Free T4: 0.71 ng/dL (ref 0.60–1.60)

## 2019-09-23 LAB — VITAMIN D 25 HYDROXY (VIT D DEFICIENCY, FRACTURES): VITD: 17.37 ng/mL — ABNORMAL LOW (ref 30.00–100.00)

## 2019-09-23 LAB — HEMOGLOBIN A1C: Hgb A1c MFr Bld: 5.7 % (ref 4.6–6.5)

## 2019-09-23 LAB — TSH: TSH: 4.85 u[IU]/mL — ABNORMAL HIGH (ref 0.35–4.50)

## 2019-09-23 MED ORDER — BUPROPION HCL ER (SR) 150 MG PO TB12: ORAL_TABLET | ORAL | 5 refills | Status: DC

## 2019-09-23 MED ORDER — DICLOFENAC SODIUM 75 MG PO TBEC: 75.0000 mg | DELAYED_RELEASE_TABLET | Freq: Two times a day (BID) | ORAL | 5 refills | Status: DC

## 2019-09-23 NOTE — Patient Instructions (Addendum)
Start Wellbutrin first, then in 2 weeks can start Voltaren (dicolfenac). Exercise > 150 minutes a week.  Try a mediterranean diet.  My calorie calculator website for caloric  intake needs. Then use an app on your phone- such as my fitness pal to track your calories.      Mediterranean Diet A Mediterranean diet refers to food and lifestyle choices that are based on the traditions of countries located on the [employer]. This way of eating has been shown to help prevent certain conditions and improve outcomes for people who have chronic diseases, like kidney disease and heart disease. What are tips for following this plan? Lifestyle  Cook and eat meals together with your family, when possible.  Drink enough fluid to keep your urine clear or pale yellow.  Be physically active every day. This includes: ? Aerobic exercise like running or swimming. ? Leisure activities like gardening, walking, or housework.  Get 7-8 hours of sleep each night.  If recommended by your health care provider, drink red wine in moderation. This means 1 glass a day for nonpregnant women and 2 glasses a day for men. A glass of wine equals 5 oz (150 mL). Reading food labels   Check the serving size of packaged foods. For foods such as rice and pasta, the serving size refers to the amount of cooked product, not dry.  Check the total fat in packaged foods. Avoid foods that have saturated fat or trans fats.  Check the ingredients list for added sugars, such as corn syrup. Shopping  At the grocery store, buy most of your food from the areas near the walls of the store. This includes: ? Fresh fruits and vegetables (produce). ? Grains, beans, nuts, and seeds. Some of these may be available in unpackaged forms or large amounts (in bulk). ? Fresh seafood. ? Poultry and eggs. ? Low-fat dairy products.  Buy whole ingredients instead of prepackaged foods.  Buy fresh fruits and vegetables in-season from local  farmers markets.  Buy frozen fruits and vegetables in resealable bags.  If you do not have access to quality fresh seafood, buy precooked frozen shrimp or canned fish, such as tuna, salmon, or sardines.  Buy small amounts of raw or cooked vegetables, salads, or olives from the deli or salad bar at your store.  Stock your pantry so you always have certain foods on hand, such as olive oil, canned tuna, canned tomatoes, rice, pasta, and beans. Cooking  Cook foods with extra-virgin olive oil instead of using butter or other vegetable oils.  Have meat as a side dish, and have vegetables or grains as your main dish. This means having meat in small portions or adding small amounts of meat to foods like pasta or stew.  Use beans or vegetables instead of meat in common dishes like chili or lasagna.  Experiment with different cooking methods. Try roasting or broiling vegetables instead of steaming or sauteing them.  Add frozen vegetables to soups, stews, pasta, or rice.  Add nuts or seeds for added healthy fat at each meal. You can add these to yogurt, salads, or vegetable dishes.  Marinate fish or vegetables using olive oil, lemon juice, garlic, and fresh herbs. Meal planning   Plan to eat 1 vegetarian meal one day each week. Try to work up to 2 vegetarian meals, if possible.  Eat seafood 2 or more times a week.  Have healthy snacks readily available, such as: ? Vegetable sticks with hummus. ? [place] yogurt. ?  Fruit and nut trail mix.  Eat balanced meals throughout the week. This includes: ? Fruit: 2-3 servings a day ? Vegetables: 4-5 servings a day ? Low-fat dairy: 2 servings a day ? Fish, poultry, or lean meat: 1 serving a day ? Beans and legumes: 2 or more servings a week ? Nuts and seeds: 1-2 servings a day ? Whole grains: 6-8 servings a day ? Extra-virgin olive oil: 3-4 servings a day  Limit red meat and sweets to only a few servings a month What are my food  choices?  Mediterranean diet ? Recommended  Grains: Whole-grain pasta. Brown rice. Bulgar wheat. Polenta. Couscous. Whole-wheat bread. [place] Cobbatmeal. Quinoa.  Vegetables: Artichokes. Beets. Broccoli. Cabbage. Carrots. Eggplant. Green beans. Chard. Kale. Spinach. Onions. Leeks. Peas. Squash. Tomatoes. Peppers. Radishes.  Fruits: Apples. Apricots. Avocado. Berries. Bananas. Cherries. Dates. Figs. Grapes. Lemons. Melon. Oranges. Peaches. Plums. Pomegranate.  Meats and other protein foods: Beans. Almonds. Sunflower seeds. Pine nuts. Peanuts. Cod. Salmon. Scallops. Shrimp. Tuna. Tilapia. Clams. Oysters. Eggs.  Dairy: Low-fat milk. Cheese. Greek yogurt.  Beverages: Water. Red wine. Herbal tea.  Fats and oils: Extra virgin olive oil. Avocado oil. Grape seed oil.  Sweets and desserts: [place] yogurt with honey. Baked apples. Poached pears. Trail mix.  Seasoning and other foods: Basil. Cilantro. Coriander. Cumin. Mint. Parsley. Sage. Rosemary. Tarragon. Garlic. Oregano. Thyme. Pepper. Balsalmic vinegar. Tahini. Hummus. Tomato sauce. Olives. Mushrooms. ? Limit these  Grains: Prepackaged pasta or rice dishes. Prepackaged cereal with added sugar.  Vegetables: Deep fried potatoes (french fries).  Fruits: Fruit canned in syrup.  Meats and other protein foods: Beef. Pork. Lamb. Poultry with skin. Hot dogs. Tomasa BlaseBacon.  Dairy: Ice cream. Sour cream. Whole milk.  Beverages: Juice. Sugar-sweetened soft drinks. Beer. Liquor and spirits.  Fats and oils: Butter. Canola oil. Vegetable oil. Beef fat (tallow). Lard.  Sweets and desserts: Cookies. Cakes. Pies. Candy.  Seasoning and other foods: Mayonnaise. Premade sauces and marinades. The items listed may not be a complete list. Talk with your dietitian about what dietary choices are right for you. Summary  The Mediterranean diet includes both food and lifestyle choices.  Eat a variety of fresh fruits and vegetables, beans, nuts, seeds, and whole  grains.  Limit the amount of red meat and sweets that you eat.  Talk with your health care provider about whether it is safe for you to drink red wine in moderation. This means 1 glass a day for nonpregnant women and 2 glasses a day for men. A glass of wine equals 5 oz (150 mL). This information is not intended to replace advice given to you by your health care provider. Make sure you discuss any questions you have with your health care provider. Document Released: 07/10/2016 Document Revised: 07/17/2016 Document Reviewed: 07/10/2016 Elsevier Patient Education  2020 [employer]   Health Maintenance, Female Adopting a healthy lifestyle and getting preventive care are important in promoting health and wellness. Ask your health care provider about:  The right schedule for you to have regular tests and exams.  Things you can do on your own to prevent diseases and keep yourself healthy. What should I know about diet, weight, and exercise? Eat a healthy diet   Eat a diet that includes plenty of vegetables, fruits, low-fat dairy products, and lean protein.  Do not eat a lot of foods that are high in solid fats, added sugars, or sodium. Maintain a healthy weight Body mass index (BMI) is used to identify weight problems. It estimates body  fat based on height and weight. Your health care provider can help determine your BMI and help you achieve or maintain a healthy weight. Get regular exercise Get regular exercise. This is one of the most important things you can do for your health. Most adults should:  Exercise for at least 150 minutes each week. The exercise should increase your heart rate and make you sweat (moderate-intensity exercise).  Do strengthening exercises at least twice a week. This is in addition to the moderate-intensity exercise.  Spend less time sitting. Even light physical activity can be beneficial. Watch cholesterol and blood lipids Have your blood tested for lipids  and cholesterol at 43 years of age, then have this test every 5 years. Have your cholesterol levels checked more often if:  Your lipid or cholesterol levels are high.  You are older than 43 years of age.  You are at high risk for heart disease. What should I know about cancer screening? Depending on your health history and family history, you may need to have cancer screening at various ages. This may include screening for:  Breast cancer.  Cervical cancer.  Colorectal cancer.  Skin cancer.  Lung cancer. What should I know about heart disease, diabetes, and high blood pressure? Blood pressure and heart disease  High blood pressure causes heart disease and increases the risk of stroke. This is more likely to develop in people who have high blood pressure readings, are of African descent, or are overweight.  Have your blood pressure checked: ? Every 3-5 years if you are 64-58 years of age. ? Every year if you are 69 years old or older. Diabetes Have regular diabetes screenings. This checks your fasting blood sugar level. Have the screening done:  Once every three years after age 44 if you are at a normal weight and have a low risk for diabetes.  More often and at a younger age if you are overweight or have a high risk for diabetes. What should I know about preventing infection? Hepatitis B If you have a higher risk for hepatitis B, you should be screened for this virus. Talk with your health care provider to find out if you are at risk for hepatitis B infection. Hepatitis C Testing is recommended for:  Everyone born from [birth year] through 1965.  Anyone with known risk factors for hepatitis C. Sexually transmitted infections (STIs)  Get screened for STIs, including gonorrhea and chlamydia, if: ? You are sexually active and are younger than 43 years of age. ? You are older than 43 years of age and your health care provider tells you that you are at risk for this type of  infection. ? Your sexual activity has changed since you were last screened, and you are at increased risk for chlamydia or gonorrhea. Ask your health care provider if you are at risk.  Ask your health care provider about whether you are at high risk for HIV. Your health care provider may recommend a prescription medicine to help prevent HIV infection. If you choose to take medicine to prevent HIV, you should first get tested for HIV. You should then be tested every 3 months for as long as you are taking the medicine. Pregnancy  If you are about to stop having your period (premenopausal) and you may become pregnant, seek counseling before you get pregnant.  Take 400 to 800 micrograms (mcg) of folic acid every day if you become pregnant.  Ask for birth control (contraception) if you want to  prevent pregnancy. Osteoporosis and menopause Osteoporosis is a disease in which the bones lose minerals and strength with aging. This can result in bone fractures. If you are 61 years old or older, or if you are at risk for osteoporosis and fractures, ask your health care provider if you should:  Be screened for bone loss.  Take a calcium or vitamin D supplement to lower your risk of fractures.  Be given hormone replacement therapy (HRT) to treat symptoms of menopause. Follow these instructions at home: Lifestyle  Do not use any products that contain nicotine or tobacco, such as cigarettes, e-cigarettes, and chewing tobacco. If you need help quitting, ask your health care provider.  Do not use street drugs.  Do not share needles.  Ask your health care provider for help if you need support or information about quitting drugs. Alcohol use  Do not drink alcohol if: ? Your health care provider tells you not to drink. ? You are pregnant, may be pregnant, or are planning to become pregnant.  If you drink alcohol: ? Limit how much you use to 0-1 drink a day. ? Limit intake if you are  breastfeeding.  Be aware of how much alcohol is in your drink. In the [place] one drink equals one 12 oz bottle of beer (355 mL), one 5 oz glass of wine (148 mL), or one 1 oz glass of hard liquor (44 mL). General instructions  Schedule regular health, dental, and eye exams.  Stay current with your vaccines.  Tell your health care provider if: ? You often feel depressed. ? You have ever been abused or do not feel safe at home. Summary  Adopting a healthy lifestyle and getting preventive care are important in promoting health and wellness.  Follow your health care provider's instructions about healthy diet, exercising, and getting tested or screened for diseases.  Follow your health care provider's instructions on monitoring your cholesterol and blood pressure. This information is not intended to replace advice given to you by your health care provider. Make sure you discuss any questions you have with your health care provider. Document Released: 06/02/2011 Document Revised: 11/10/2018 Document Reviewed: 11/10/2018 Elsevier Patient Education  2020 [employer].

## 2019-09-23 NOTE — Progress Notes (Signed)
Patient ID: Teresa Osborne, female  DOB: 1976-05-05, 43 y.o.   MRN: [id number] Patient Care Team    Relationship Specialty Notifications Start End  Teresa Leatherwood, DO PCP - General Family Medicine  01/09/16     Chief Complaint  Patient presents with  . Annual Exam    Fasting. Pap smear 05/2018, mammogram- has never had one.    Subjective:  Teresa Osborne is a 43 y.o.  Female  present for CPE. All past medical history, surgical history, allergies, family history, immunizations, medications and social history were updated in the electronic medical record today. All recent labs, ED visits and hospitalizations within the last year were reviewed. Knee: Weight:  Health maintenance:  Colonoscopy: No known family history of colon cancer. Routine screening at age 26. Mammogram: No known history of breast cancer in the family.[facility] again. Re-ordered for her. strongly encouraged her to have mammogram.  Cervical cancer screening: 05/2018- HPV positive- one year follow up>> she states she will make the [facility].  Immunizations: tdapupdated 2018, declines flu shot.  Infectious disease screening: HIV 2008 DEXA:n/a Assistive device: none Oxygen HLK:TGYB Patient has a Dental home. Hospitalizations/ED visits: reviewed   Depression screen [facility] 2/9 09/23/2019 09/15/2018 09/14/2017 09/11/2017 01/09/2016  Decreased Interest 0 0 0 0 0  Down, Depressed, Hopeless 0 1 0 0 0  PHQ - 2 Score 0 1 0 0 0  Altered sleeping - 0 - - -  Tired, decreased energy - 1 - - -  Change in appetite - 1 - - -  Feeling bad or failure about yourself  - 1 - - -  Trouble concentrating - 0 - - -  Moving slowly or fidgety/restless - 0 - - -  Suicidal thoughts - 0 - - -  PHQ-9 Score - 4 - - -  Difficult doing work/chores - Not difficult at all - - -   No flowsheet data found.   Immunization History  Administered Date(s) Administered  .  Tdap 12/01/2006, 09/14/2017   Past Medical History:  Diagnosis Date  . Anxiety   . History of kidney stones   . Kidney stones    Staghorn  . PONV (postoperative nausea and vomiting)    NOV 2018, JAN TREMORS LASTED ABOUT 15 MINUTES, LIKES SCOPOLOMINE PATCH; no issues wuth last kidney stone surgery   . Pre-diabetes   . Vaginal Pap smear, abnormal    No Known Allergies Past Surgical History:  Procedure Laterality Date  . CYSTOSCOPY  10/27/2017   Procedure: CYSTOSCOPY FLEXIBLE;  Surgeon: Malen Gauze, MD;  [facility]: [facility];  Service: Urology;;  . Bluford Kaufmann WITH RETROGRADE PYELOGRAM, URETEROSCOPY AND STENT PLACEMENT Left 01/21/2018   Procedure: CYSTOSCOPY WITH RETROGRADE PYELOGRAM, URETEROSCOPY AND STENT PLACEMENT;  Surgeon: Malen Gauze, MD;  [facility];  Service: Urology;  Laterality: Left;  . HOLMIUM LASER APPLICATION Left 01/21/2018   Procedure: HOLMIUM LASER APPLICATION;  Surgeon: Malen Gauze, MD;  [facility];  Service: Urology;  Laterality: Left;  . IR URETERAL STENT LEFT NEW ACCESS W/O SEP NEPHROSTOMY CATH  02/18/2018  . NEPHROLITHOTOMY Left 12/07/2017   Procedure: LEFT NEPHROLITHOTOMY PERCUTANEOUS AND STENT PLACEMENT;  Surgeon: Malen Gauze, MD;  [facility];  Service: Urology;  Laterality: Left;  . NEPHROLITHOTOMY Left 02/19/2018   Procedure: NEPHROLITHOTOMY PERCUTANEOUS;  Surgeon: Malen Gauze, MD;  [facility];  Service: Urology;  Laterality: Left;  . NEPHROLITHOTOMY  Left 02/25/2018   Procedure: NEPHROLITHOTOMY PERCUTANEOUS SECOND LOOK;  Surgeon: Malen GauzeMcKenzie, Patrick L, MD;  [facility];  Service: Urology;  Laterality: Left;  . NEPHROSTOMY Left 10/27/2017   Procedure: NEPHROSTOMY TUBE PLACEMENT;  Surgeon: Malen GauzeMcKenzie, Patrick L, MD;  [facility];  Service: Urology;  Laterality: Left;   Family History  Problem Relation Age of Onset  . Cancer Paternal Grandfather        pancreatic  . Alcohol abuse Paternal Grandfather    . Arthritis Paternal Grandfather   . COPD Paternal Grandfather   . Early death Paternal Grandfather   . Cancer Paternal Grandmother        unknown, metz  . Arthritis Paternal Grandmother   . COPD Paternal Grandmother   . Early death Paternal Grandmother   . Arthritis Father   . Mental retardation Father   . Arthritis Maternal Grandmother   . COPD Maternal Grandfather   . Heart disease Maternal Grandfather   . Cancer Sister [id number]       ovarian   Social History   Social History Narrative   Married to [place]. 2 children [place].   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   Drink caffeinated beverages, wears her seatbelt, wears a bike helmet.   Smoke detector in the home   Feels safe in her relationships    Allergies as of 09/23/2019   No Known Allergies     Medication List       Accurate as of September 23, 2019 11:59 PM. If you have any questions, ask your nurse or doctor.        STOP taking these medications   naproxen 500 MG tablet Commonly known as: Naprosyn Stopped by: Felix Pacinienee , DO     TAKE these medications   buPROPion 150 MG 12 hr tablet Commonly known as: Wellbutrin SR 1 tab daily for 3 days, then increase to 1 tab BID Started by: Felix Pacinienee , DO   diclofenac 75 MG EC tablet Commonly known as: VOLTAREN Take 1 tablet (75 mg total) by mouth 2 (two) times daily. Started by: Felix Pacinienee , DO   levonorgestrel 20 MCG/24HR IUD Commonly known as: MIRENA 1 each once by Intrauterine route.   Vitamin D (Ergocalciferol) 1.25 MG (50000 UT) Caps capsule Commonly known as: DRISDOL Take 1 capsule (50,000 Units total) by mouth every 7 (seven) days.   Vitamin D 50 MCG (2000 UT) Caps Take 1 capsule by mouth daily.       All past medical history, surgical history, allergies, family history, immunizations andmedications were updated in the EMR today and reviewed under the history and medication portions of their EMR.     Recent  Results (from the past 2160 hour(s))  CBC     Status: None   Collection Time: 09/23/19 10:17 AM  Result Value Ref Range   WBC 7.1 4.0 - 10.5 K/uL   RBC 4.49 3.87 - 5.11 Mil/uL   Platelets 311.0 150.0 - 400.0 K/uL   Hemoglobin 13.7 12.0 - 15.0 g/dL   HCT 16.141.0 09.636.0 - 04.546.0 %   MCV 91.4 78.0 - 100.0 fl   MCHC 33.5 30.0 - 36.0 g/dL   RDW 40.913.9 81.111.5 - 91.415.5 %  Comprehensive metabolic panel     Status: Abnormal   Collection Time: 09/23/19 10:17 AM  Result Value Ref Range   Sodium 138 135 - 145 mEq/L   Potassium 4.8 3.5 - 5.1 mEq/L   Chloride 104 96 - 112 mEq/L  CO2 26 19 - 32 mEq/L   Glucose, Bld 101 (H) 70 - 99 mg/dL   BUN 13 6 - 23 mg/dL   Creatinine, Ser 0.99 0.40 - 1.20 mg/dL   Total Bilirubin 0.4 0.2 - 1.2 mg/dL   Alkaline Phosphatase 69 39 - 117 U/L   AST 13 0 - 37 U/L   ALT 16 0 - 35 U/L   Total Protein 7.1 6.0 - 8.3 g/dL   Albumin 3.9 3.5 - 5.2 g/dL   Calcium 9.0 8.4 - 10.5 mg/dL   GFR 61.04 >60.00 mL/min  Hemoglobin A1c     Status: None   Collection Time: 09/23/19 10:17 AM  Result Value Ref Range   Hgb A1c MFr Bld 5.7 4.6 - 6.5 %    Comment: Glycemic Control Guidelines for People with Diabetes:Non Diabetic:  <6%Goal of Therapy: <7%Additional Action Suggested:  >8%   Lipid panel     Status: Abnormal   Collection Time: 09/23/19 10:17 AM  Result Value Ref Range   Cholesterol 193 0 - 200 mg/dL    Comment: ATP III Classification       Desirable:  < 200 mg/dL               Borderline High:  200 - 239 mg/dL          High:  > = 240 mg/dL   Triglycerides 96.0 0.0 - 149.0 mg/dL    Comment: Normal:  <150 mg/dLBorderline High:  150 - 199 mg/dL   HDL 51.70 >39.00 mg/dL   VLDL 19.2 0.0 - 40.0 mg/dL   LDL Cholesterol 122 (H) 0 - 99 mg/dL   Total CHOL/HDL Ratio 4     Comment:                Men          Women1/2 Average Risk     3.4          3.3Average Risk          5.0          4.42X Average Risk          9.6          7.13X Average Risk          15.0          11.0                        NonHDL 141.27     Comment: NOTE:  Non-HDL goal should be 30 mg/dL higher than patient's LDL goal (i.e. LDL goal of < 70 mg/dL, would have non-HDL goal of < 100 mg/dL)  TSH     Status: Abnormal   Collection Time: 09/23/19 10:17 AM  Result Value Ref Range   TSH 4.85 (H) 0.35 - 4.50 uIU/mL  Vitamin D (25 hydroxy)     Status: Abnormal   Collection Time: 09/23/19 10:17 AM  Result Value Ref Range   VITD 17.37 (L) 30.00 - 100.00 ng/mL  T4, free     Status: None   Collection Time: 09/23/19 10:17 AM  Result Value Ref Range   Free T4 0.71 0.60 - 1.60 ng/dL    Comment: Specimens from patients who are undergoing biotin therapy and /or ingesting biotin supplements may contain high levels of biotin.  The higher biotin concentration in these specimens interferes with this Free T4 assay.  Specimens that contain high levels  of biotin may cause false high results  for this Free T4 assay.  Please interpret results in light of the total clinical presentation of the patient.       ROS: 14 pt review of systems performed and negative (unless mentioned in an HPI)  Objective: BP 138/80 (BP Location: Right Arm, Patient Position: Sitting, Cuff Size: Large) Comment (Cuff Size): Manually  Pulse 89   Temp (!) 97.2 F (36.2 C) (Temporal)   Resp 19   Ht 5' 5.75" (1.67 m)   Wt (!) 469 lb (212.7 kg)   SpO2 97%   BMI 76.28 kg/m  Gen: Afebrile. No acute distress. Nontoxic in appearance, well-developed, well-nourished, pleasant, morbidly obese female. HENT: AT. Teresa Osborne. Bilateral TM visualized and normal in appearance, normal external auditory canal. MMM, no oral lesions, adequate dentition. Bilateral nares within normal limits. Throat without erythema, ulcerations or exudates.  No cough on exam, no hoarseness on exam. Eyes:Pupils Equal Round Reactive to light, Extraocular movements intact,  Conjunctiva without redness, discharge or icterus. Neck/lymp/endocrine: Supple, no lymphadenopathy, no thyromegaly CV: RRR no  murmur, no edema, +2/4 P posterior tibialis pulses.  No carotid bruits. No JVD. Chest: CTAB, no wheeze, rhonchi or crackles.  Normal respiratory effort.  Good air movement. Abd: Soft.  Obese. NTND. BS present.  No masses palpated. No hepatosplenomegaly. No rebound tenderness or guarding. Skin: No rashes, purpura or petechiae. Warm and well-perfused. Skin intact. Neuro/Msk:  Normal gait. PERLA. EOMi. Alert. Oriented x3.  Cranial nerves II through XII intact. Muscle strength 5/5 upper/lower extremity. DTRs equal bilaterally. Psych: Normal affect, dress and demeanor. Normal speech. Normal thought content and judgment.  No exam data present  Assessment/plan: Teresa Osborne is a 43 y.o. female present for CPE Vitamin D deficiency - Comprehensive metabolic panel - Vitamin D (25 hydroxy) Morbid obesity with BMI of 70 and over, adult (HCC) - Lipid panel Screening for deficiency anemia - CBC Encounter for long-term current use of medication - Comprehensive metabolic panel - TSH Elevated glucose/Prediabetes/Weight gain - Comprehensive metabolic panel - Hemoglobin A1c - TSH - T4, free  Weight loss counseling, encounter for Start Wellbutrin twice daily. Start exercise regimen, initial goal greater than 150 minutes a week of cardiovascular exercise.  Discussed optimal heart rate for weight loss. Discussed Mediterranean diet. Discussed calorie calculator website and applications for smart phones to help understand daily caloric need and tracking daily calories consumed. Increased water consumption to at least 120 ounces or more daily. Follow-up in 6 weeks  Arthritis of knee, left Discussed treatment options with patient today and she elected to start Voltaren 75 mg twice daily with meals. Follow-up 6 weeks.  Encounter for screening mammogram for malignant neoplasm of breast - MM 3D SCREEN BREAST BILATERAL; Future   Patient was encouraged to exercise greater than 150 minutes a week.  Patient was encouraged to choose a diet filled with fresh fruits and vegetables, and lean meats. AVS provided to patient today for education/recommendation on gender specific health and safety maintenance. Colonoscopy: No known family history of colon cancer. Routine screening at age 54. Mammogram: No known history of breast cancer in the family.[facility] di dnot have completed last year again. Re-ordered for her. strongly encouraged her to have mammogram.  Cervical cancer screening: 05/2018- HPV positive- one year follow up>> she states she will make the [facility].  Immunizations: tdapupdated 2018, declines flu shot.  Infectious disease screening: HIV 2008 DEXA:n/a   6 weeks follow up.   Orders Placed This Encounter  Procedures  . MM  3D SCREEN BREAST BILATERAL  . CBC  . Comprehensive metabolic panel  . Hemoglobin A1c  . Lipid panel  . TSH  . Vitamin D (25 hydroxy)  . T4, free     Return in about 6 weeks (around 11/04/2019).  Electronically signed by: Felix Pacini, DO [facility] [place]

## 2019-09-26 ENCOUNTER — Telehealth: Payer: Self-pay | Admitting: Family Medicine

## 2019-09-26 MED ORDER — VITAMIN D (ERGOCALCIFEROL) 1.25 MG (50000 UNIT) PO CAPS: 50000.0000 [IU] | ORAL_CAPSULE | ORAL | 1 refills | Status: DC

## 2019-09-26 MED ORDER — LEVOTHYROXINE SODIUM 25 MCG PO TABS: 25.0000 ug | ORAL_TABLET | Freq: Every day | ORAL | 0 refills | Status: DC

## 2019-09-26 NOTE — Telephone Encounter (Signed)
Pt was called and message was left to return call  

## 2019-09-26 NOTE — Telephone Encounter (Signed)
Pt was called and given all instructions/information. She agreed to start medications and states she will call back once all the medication has been started to schedule F/U

## 2019-09-26 NOTE — Telephone Encounter (Signed)
Please inform patient the following information: Her liver, kidney, blood counts and electrolytes are normal. Her cholesterol looks good. Her vitamin D is extremely low at 56, I again have added the once weekly high-dose supplementation.  She should also continue the over-the-counter 2000 units daily of vitamin D3.  Vitamin D should be taken with food for better absorption. Her diabetes screen/A1c is a little higher from last year was 5.4, now 5.7.  This is still in normal range and not considered prediabetes.  Her thyroid is mildly underactive.  This can be playing a small role in her struggle with losing weight.  I would suggest starting you very low-dose daily thyroid supplement to help bring her thyroid functioning to a normal level, which also may aid in her energy level and weight loss.   She was asked to make a 6-week follow-up for start of other new medications.  We will recheck her vitamin D level and her thyroid level after starting supplementations at that visit as well.

## 2019-09-27 ENCOUNTER — Encounter: Payer: Self-pay | Admitting: Family Medicine

## 2019-09-27 DIAGNOSIS — Z6841 Body Mass Index (BMI) 40.0 and over, adult: Secondary | ICD-10-CM | POA: Insufficient documentation

## 2019-09-27 DIAGNOSIS — M1710 Unilateral primary osteoarthritis, unspecified knee: Secondary | ICD-10-CM | POA: Insufficient documentation

## 2019-11-21 ENCOUNTER — Telehealth: Payer: Self-pay

## 2019-11-21 NOTE — Telephone Encounter (Signed)
Pt called after hours, 11/19/2019 @ 0921, for lower back pain which she thinks could be a possible kidney infection. She was told medication could not be sent in without visit. Advised to visit urgent care and referred to [facility]. Pt was called this AM, VM left to return call to see if she needed appt or was seen a UC this weekend

## 2019-11-21 NOTE — Telephone Encounter (Signed)
Pt returned call and was offered appt. Pt would like afternoon appt for tomorrow, she was scheduled. She states she has some abd cramping with back pain. Denies fever, blood in urine, or urine frequency. She would like appt for tomorrow just to make sure she does not have kidney infection or stone. She is feeling a little better today than she did over the weekend. She will go to hospital if pain gets worse or starts running fever.

## 2019-11-22 ENCOUNTER — Encounter: Payer: Self-pay | Admitting: Family Medicine

## 2019-11-22 ENCOUNTER — Ambulatory Visit: Payer: Managed Care, Other (non HMO) | Admitting: Family Medicine

## 2019-11-22 ENCOUNTER — Other Ambulatory Visit: Payer: Self-pay

## 2019-11-22 VITALS — BP 168/81 | HR 103 | Temp 98.1°F | Resp 16 | Ht 66.0 in | Wt >= 6400 oz

## 2019-11-22 DIAGNOSIS — R300 Dysuria: Secondary | ICD-10-CM | POA: Diagnosis not present

## 2019-11-22 DIAGNOSIS — Z87442 Personal history of urinary calculi: Secondary | ICD-10-CM

## 2019-11-22 DIAGNOSIS — M545 Low back pain, unspecified: Secondary | ICD-10-CM

## 2019-11-22 LAB — POC URINALSYSI DIPSTICK (AUTOMATED)
Bilirubin, UA: NEGATIVE
Blood, UA: NEGATIVE
Glucose, UA: NEGATIVE
Ketones, UA: 5
Nitrite, UA: NEGATIVE
Protein, UA: POSITIVE — AB
Spec Grav, UA: 1.025 (ref 1.010–1.025)
Urobilinogen, UA: 1 E.U./dL
pH, UA: 6 (ref 5.0–8.0)

## 2019-11-22 MED ORDER — CEPHALEXIN 500 MG PO CAPS: 500.0000 mg | ORAL_CAPSULE | Freq: Four times a day (QID) | ORAL | 0 refills | Status: DC

## 2019-11-22 NOTE — [facility] Notes (Signed)
This visit occurred during the SARS-CoV-2 public health emergency.  Safety protocols were in place, including screening questions prior to the visit, additional usage of staff PPE, and extensive cleaning of exam room while observing appropriate contact time as indicated for disinfecting solutions.    Teresa Osborne , July 08, 1976, 43 y.o., female MRN: [id number] Patient Care Team    Relationship Specialty Notifications Start End  Natalia LeatherwoodKuneff, Shaye Lagace A, DO PCP - General Family Medicine  01/09/16     Chief Complaint  Patient presents with  . Back Pain    lower, x3 days     Subjective: Pt presents for an OV with complaints of low back pain  of 3 days duration.  Associated symptoms include right side was worse and had a sharp pan that lasted about 30 min to 1 hour Saturday. Since that time her pain has been a dull ache bilateral back. She denies over activity or injury. She denies fever, chills or vomit. She endorses mild nausea. Urine is dark with odor. She has a sig h/o right renal calculi and sepsis in the past.     [facility] 09/23/2019 09/15/2018 09/14/2017 09/11/2017 01/09/2016  Decreased Interest 0 0 0 0 0  Down, Depressed, Hopeless 0 1 0 0 0  PHQ - 2 Score 0 1 0 0 0  Altered sleeping - 0 - - -  Tired, decreased energy - 1 - - -  Change in appetite - 1 - - -  Feeling bad or failure about yourself  - 1 - - -  Trouble concentrating - 0 - - -  Moving slowly or fidgety/restless - 0 - - -  Suicidal thoughts - 0 - - -  PHQ-9 Score - 4 - - -  Difficult doing work/chores - Not difficult at all - - -    No Known Allergies Social History   Social History Narrative   Married to [place]. 2 children [place].   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   Drink caffeinated beverages, wears her seatbelt, wears a bike helmet.   Smoke detector in the home   Feels safe in her relationships   Past Medical History:  Diagnosis Date  . Anxiety   .  History of kidney stones   . Kidney stones    Staghorn  . PONV (postoperative nausea and vomiting)    NOV 2018, JAN TREMORS LASTED ABOUT 15 MINUTES, LIKES SCOPOLOMINE PATCH; no issues wuth last kidney stone surgery   . Pre-diabetes   . Vaginal Pap smear, abnormal    Past Surgical History:  Procedure Laterality Date  . CYSTOSCOPY  10/27/2017   Procedure: CYSTOSCOPY FLEXIBLE;  Surgeon: Malen GauzeMcKenzie, Patrick L, MD;  [facility];  Service: Urology;;  . Bluford KaufmannYSTOSCOPY WITH RETROGRADE PYELOGRAM, URETEROSCOPY AND STENT PLACEMENT Left 01/21/2018   Procedure: CYSTOSCOPY WITH RETROGRADE PYELOGRAM, URETEROSCOPY AND STENT PLACEMENT;  Surgeon: Malen GauzeMcKenzie, Patrick L, MD;  [facility];  Service: Urology;  Laterality: Left;  . HOLMIUM LASER APPLICATION Left 01/21/2018   Procedure: HOLMIUM LASER APPLICATION;  Surgeon: Malen GauzeMcKenzie, Patrick L, MD;  [facility];  Service: Urology;  Laterality: Left;  . IR URETERAL STENT LEFT NEW ACCESS W/O SEP NEPHROSTOMY CATH  02/18/2018  . NEPHROLITHOTOMY Left 12/07/2017   Procedure: LEFT NEPHROLITHOTOMY PERCUTANEOUS AND STENT PLACEMENT;  Surgeon: Malen GauzeMcKenzie, Patrick L, MD;  [facility];  Service: Urology;  Laterality: Left;  . NEPHROLITHOTOMY Left 02/19/2018   Procedure: NEPHROLITHOTOMY PERCUTANEOUS;  Surgeon: Malen GauzeMcKenzie, Patrick L,  MD;  [facility];  Service: Urology;  Laterality: Left;  . NEPHROLITHOTOMY Left 02/25/2018   Procedure: NEPHROLITHOTOMY PERCUTANEOUS SECOND LOOK;  Surgeon: Cleon Gustin, MD;  [facility];  Service: Urology;  Laterality: Left;  . NEPHROSTOMY Left 10/27/2017   Procedure: NEPHROSTOMY TUBE PLACEMENT;  Surgeon: Cleon Gustin, MD;  [facility];  Service: Urology;  Laterality: Left;   Family History  Problem Relation Age of Onset  . Cancer Paternal Grandfather        pancreatic  . Alcohol abuse Paternal Grandfather   . Arthritis Paternal Grandfather   . COPD Paternal Grandfather   . Early death Paternal Grandfather   .  Cancer Paternal Grandmother        unknown, metz  . Arthritis Paternal Grandmother   . COPD Paternal Grandmother   . Early death Paternal Grandmother   . Arthritis Father   . Mental retardation Father   . Arthritis Maternal Grandmother   . COPD Maternal Grandfather   . Heart disease Maternal Grandfather   . Cancer Sister 62       ovarian   Allergies as of 11/22/2019   No Known Allergies     Medication List       Accurate as of November 22, 2019  5:21 PM. If you have any questions, ask your nurse or doctor.        buPROPion 150 MG 12 hr tablet Commonly known as: Wellbutrin SR 1 tab daily for 3 days, then increase to 1 tab BID   cephALEXin 500 MG capsule Commonly known as: KEFLEX Take 1 capsule (500 mg total) by mouth 4 (four) times daily. Started by: Howard Pouch, DO   diclofenac 75 MG EC tablet Commonly known as: VOLTAREN Take 1 tablet (75 mg total) by mouth 2 (two) times daily.   levonorgestrel 20 MCG/24HR IUD Commonly known as: MIRENA 1 each once by Intrauterine route.   levothyroxine 25 MCG tablet Commonly known as: SYNTHROID Take 1 tablet (25 mcg total) by mouth daily.   Vitamin D (Ergocalciferol) 1.25 MG (50000 UT) Caps capsule Commonly known as: DRISDOL Take 1 capsule (50,000 Units total) by mouth every 7 (seven) days.   Vitamin D (Ergocalciferol) 1.25 MG (50000 UT) Caps capsule Commonly known as: DRISDOL Take 1 capsule (50,000 Units total) by mouth every 7 (seven) days.   Vitamin D 50 MCG (2000 UT) Caps Take 1 capsule by mouth daily.       All past medical history, surgical history, allergies, family history, immunizations andmedications were updated in the EMR today and reviewed under the history and medication portions of their EMR.     ROS: Negative, with the exception of above mentioned in HPI   Objective:  BP (!) 168/81 (BP Location: Right Arm, Patient Position: Sitting, Cuff Size: Large)   Pulse (!) 103   Temp 98.1 F (36.7 C)  (Temporal)   Resp 16   Ht 5\' 6"  (1.676 m)   Wt (!) 478 lb 9.6 oz (217.1 kg)   SpO2 95%   BMI 77.25 kg/m  Body mass index is 77.25 kg/m. Gen: Afebrile. No acute distress. Nontoxic in appearance, well developed, well nourished.  HENT: AT. [place].  Eyes:Pupils Equal Round Reactive to light, Extraocular movements intact,  Conjunctiva without redness, discharge or icterus. CV: RRR  Chest: CTAB, no wheeze or crackles. Good air movement, normal resp effort.  Abd: Soft. NTND. BS present. no Masses palpated. No rebound or guarding.  MSK: no CVA tenderness.  Neuro: Normal gait. PERLA. EOMi. Alert. Oriented x3  Psych: Normal affect, dress and demeanor. Normal speech. Normal thought content and judgment.  No exam data present No results found. Results for orders placed or performed in visit on 11/22/19 (from the past 24 hour(s))  POCT Urinalysis Dipstick (Automated)     Status: Abnormal   Collection Time: 11/22/19  2:59 PM  Result Value Ref Range   Color, UA YELLOW    Clarity, UA CLOUDY    Glucose, UA Negative Negative   Bilirubin, UA NEGATIVE    Ketones, UA 5    Spec Grav, UA 1.025 1.010 - 1.025   Blood, UA neg    pH, UA 6.0 5.0 - 8.0   Protein, UA Positive (A) Negative   Urobilinogen, UA 1.0 0.2 or 1.0 E.U./dL   Nitrite, UA neg    Leukocytes, UA Small (1+) (A) Negative    Assessment/Plan: Teresa Osborne is a 43 y.o. female present for OV for  Acute low back pain, unspecified bHistory of renal calculiack pain laterality, unspecified whether sciatica present/dysuria/ - poss kidney stone passed vs UTI start. Urine did have leuks today and odor.  - start keflex QID. - HYDRATE - POCT Urinalysis Dipstick (Automated) - Urine Culture - pt will be called with results once available.  - F/U PRN    Reviewed expectations re: course of current medical issues.  Discussed self-management of symptoms.  Outlined signs and symptoms indicating need for more acute intervention.  Patient  verbalized understanding and all questions were answered.  Patient received an After-Visit Summary.   > 25 minutes spent with patient, >50% of time spent face to face    Orders Placed This Encounter  Procedures  . Urine Culture  . POCT Urinalysis Dipstick (Automated)     Note is dictated utilizing voice recognition software. Although note has been proof read prior to signing, occasional typographical errors still can be missed. If any questions arise, please do not hesitate to call for verification.   electronically signed by:  Felix Pacini, DO  [facility] [place]

## 2019-11-22 NOTE — Patient Instructions (Signed)
Flush those kidneys.  We will get you results once we get them. If it results on the holiday weekend you should still be able to see it, but we will not get it back until after Christmas.   Kelfex every 6 hours for 7 days.

## 2019-11-24 LAB — URINE CULTURE
MICRO NUMBER:: 1224712
SPECIMEN QUALITY:: ADEQUATE

## 2019-12-09 ENCOUNTER — Other Ambulatory Visit: Payer: Self-pay

## 2019-12-09 ENCOUNTER — Encounter: Payer: Self-pay | Admitting: Family Medicine

## 2019-12-09 ENCOUNTER — Ambulatory Visit (INDEPENDENT_AMBULATORY_CARE_PROVIDER_SITE_OTHER): Payer: Managed Care, Other (non HMO) | Admitting: Family Medicine

## 2019-12-09 ENCOUNTER — Ambulatory Visit: Payer: Managed Care, Other (non HMO) | Attending: Internal Medicine

## 2019-12-09 VITALS — Temp 98.9°F | Ht 66.0 in

## 2019-12-09 DIAGNOSIS — R519 Headache, unspecified: Secondary | ICD-10-CM

## 2019-12-09 DIAGNOSIS — R6883 Chills (without fever): Secondary | ICD-10-CM

## 2019-12-09 DIAGNOSIS — R059 Cough, unspecified: Secondary | ICD-10-CM

## 2019-12-09 DIAGNOSIS — R05 Cough: Secondary | ICD-10-CM

## 2019-12-09 DIAGNOSIS — R52 Pain, unspecified: Secondary | ICD-10-CM | POA: Diagnosis not present

## 2019-12-09 DIAGNOSIS — Z20822 Contact with and (suspected) exposure to covid-19: Secondary | ICD-10-CM

## 2019-12-09 MED ORDER — BENZONATATE 100 MG PO CAPS: 200.0000 mg | ORAL_CAPSULE | Freq: Two times a day (BID) | ORAL | 0 refills | Status: DC | PRN

## 2019-12-09 NOTE — Addendum Note (Signed)
Addended by: Felix Pacini A on: 12/09/2019 03:14 PM   Modules accepted: Orders

## 2019-12-09 NOTE — Progress Notes (Signed)
VIRTUAL VISIT VIA VIDEO  I connected with Teresa Osborne on 12/09/19 at 11:30 AM EST by a video enabled telemedicine application and verified that I am speaking with the correct person using two identifiers. Location patient: Home Location provider: [facility], Office Persons participating in the virtual visit: Patient, Dr. Claiborne Billings and R.Baker, LPN  I discussed the limitations of evaluation and management by telemedicine and the availability of in person appointments. The patient expressed understanding and agreed to proceed.   SUBJECTIVE Chief Complaint  Patient presents with  . Headache    Pt had fever yesterday with chills and body aches. Is feeling much better today but still has headache.   . Generalized Body Aches  . Chills    ZOX:[id number] Osborne is a 44 y.o. female present for acute illness.  She reports headache and body aches that started on Tuesday, January 5.  Yesterday she had a chills and a fever and felt more fatigued.  She also endorses some nausea.  She has developed a mild cough over this course.  She denies any shortness of breath, sinus pressure, nasal congestion, vomit or diarrhea.  She has not been exposed to any sick contacts that she is aware of.  She is working from home.  She did drive her daughter to her friends over the weekend to allow them to [employer].  Her daughter is not showing signs of illness.  ROS: See pertinent positives and negatives per HPI.  Patient Active Problem List   Diagnosis Date Noted  . Arthritis of knee 09/27/2019  . Morbid obesity with BMI of 70 and over, adult ([facility]CC) 09/27/2019  . Cervical high risk HPV (human papillomavirus) test positive 06/22/2018  . History of renal calculi 10/27/2017  . Vitamin D deficiency 01/10/2016  . Encounter for IUD insertion 03/30/2013    Social History   Tobacco Use  . Smoking status: Never Smoker  . Smokeless tobacco: Never Used  Substance Use Topics  . Alcohol use: No     Current Outpatient Medications:  .  buPROPion (WELLBUTRIN SR) 150 MG 12 hr tablet, 1 tab daily for 3 days, then increase to 1 tab BID, Disp: 60 tablet, Rfl: 5 .  Cholecalciferol (VITAMIN D) 50 MCG (2000 UT) CAPS, Take 1 capsule by mouth daily., Disp: , Rfl:  .  diclofenac (VOLTAREN) 75 MG EC tablet, Take 1 tablet (75 mg total) by mouth 2 (two) times daily., Disp: 60 tablet, Rfl: 5 .  levonorgestrel (MIRENA) 20 MCG/24HR IUD, 1 each once by Intrauterine route., Disp: , Rfl:  .  levothyroxine (SYNTHROID) 25 MCG tablet, Take 1 tablet (25 mcg total) by mouth daily., Disp: 90 tablet, Rfl: 0 .  Vitamin D, Ergocalciferol, (DRISDOL) 1.25 MG (50000 UT) CAPS capsule, Take 1 capsule (50,000 Units total) by mouth every 7 (seven) days., Disp: 12 capsule, Rfl: 0 .  cephALEXin (KEFLEX) 500 MG capsule, Take 1 capsule (500 mg total) by mouth 4 (four) times daily. (Patient not taking: Reported on 12/09/2019), Disp: 28 capsule, Rfl: 0  No Known Allergies  OBJECTIVE: Temp 98.9 F (37.2 C) (Temporal)   Ht 5\' 6"  (1.676 m)   BMI 77.25 kg/m  Gen: No acute distress. Nontoxic in appearance.  HENT: AT. [place].  MMM.  Eyes:Pupils Equal Round Reactive to light, Extraocular movements intact,  Conjunctiva without redness, discharge or icterus. Chest: Cough or shortness of breath not present.  Neuro: Normal gait. Alert. Oriented x3  Psych: Normal affect, dress and demeanor. Normal speech. Normal thought  content and judgment.  ASSESSMENT AND PLAN: Teresa Osborne is a 44 y.o. female present for  Nonintractable headache, unspecified chronicity pattern, unspecified headache type/Body aches/Chills/Cough Rest, hydrate.  mucinex (DM if cough), NSAIDs and/or Tylenol for over-the-counter supportive therapy. Covid education provided today including isolation protocol, over-the-counter supportive therapy, emergent precautions. - Novel Coronavirus, NAA ([facility]) ordered and testing site arrangements/instructions were  provided. -If Covid test is positive but advised her to self isolate until Sunday, January 17.  Isolation can and at that time if she has not had a fever for greater than 72 hours without use of NSAIDs/Tylenol. -If Covid test is negative and her symptoms persist greater than 7-10 days or worsening, follow-up and we will evaluate for need of antibiotic treatment.        Orders Placed This Encounter  Procedures  . Novel Coronavirus, NAA ([facility])   No orders of the defined types were placed in this encounter.    Howard Pouch, DO 12/09/2019

## 2019-12-09 NOTE — Patient Instructions (Signed)
COVID-19 COVID-19 is a respiratory infection that is caused by a virus called severe acute respiratory syndrome coronavirus 2 (SARS-CoV-2). The disease is also known as coronavirus disease or novel coronavirus. In some people, the virus may not cause any symptoms. In others, it may cause a serious infection. The infection can get worse quickly and can lead to complications, such as:  Pneumonia, or infection of the lungs.  Acute respiratory distress syndrome or ARDS. This is a condition in which fluid build-up in the lungs prevents the lungs from filling with air and passing oxygen into the blood.  Acute respiratory failure. This is a condition in which there is not enough oxygen passing from the lungs to the body or when carbon dioxide is not passing from the lungs out of the body.  Sepsis or septic shock. This is a serious bodily reaction to an infection.  Blood clotting problems.  Secondary infections due to bacteria or fungus.  Organ failure. This is when your body's organs stop working. The virus that causes COVID-19 is contagious. This means that it can spread from person to person through droplets from coughs and sneezes (respiratory secretions). What are the causes? This illness is caused by a virus. You may catch the virus by:  Breathing in droplets from an infected person. Droplets can be spread by a person breathing, speaking, singing, coughing, or sneezing.  Touching something, like a table or a doorknob, that was exposed to the virus (contaminated) and then touching your mouth, nose, or eyes. What increases the risk? Risk for infection You are more likely to be infected with this virus if you:  Are within 6 feet (2 meters) of a person with COVID-19.  Provide care for or live with a person who is infected with COVID-19.  Spend time in crowded indoor spaces or live in shared housing. Risk for serious illness You are more likely to become seriously ill from the virus if you:   Are 50 years of age or older. The higher your age, the more you are at risk for serious illness.  Live in a nursing home or long-term care facility.  Have cancer.  Have a long-term (chronic) disease such as: ? Chronic lung disease, including chronic obstructive pulmonary disease or asthma. ? A long-term disease that lowers your body's ability to fight infection (immunocompromised). ? Heart disease, including heart failure, a condition in which the arteries that lead to the heart become narrow or blocked (coronary artery disease), a disease which makes the heart muscle thick, weak, or stiff (cardiomyopathy). ? Diabetes. ? Chronic kidney disease. ? Sickle cell disease, a condition in which red blood cells have an abnormal "sickle" shape. ? Liver disease.  Are obese. What are the signs or symptoms? Symptoms of this condition can range from mild to severe. Symptoms may appear any time from 2 to 14 days after being exposed to the virus. They include:  A fever or chills.  A cough.  Difficulty breathing.  Headaches, body aches, or muscle aches.  Runny or stuffy (congested) nose.  A sore throat.  New loss of taste or smell. Some people may also have stomach problems, such as nausea, vomiting, or diarrhea. Other people may not have any symptoms of COVID-19. How is this diagnosed? This condition may be diagnosed based on:  Your signs and symptoms, especially if: ? You live in an area with a COVID-19 outbreak. ? You recently traveled to or from an area where the virus is common. ? You   provide care for or live with a person who was diagnosed with COVID-19. ? You were exposed to a person who was diagnosed with COVID-19.  A physical exam.  Lab tests, which may include: ? Taking a sample of fluid from the back of your nose and throat (nasopharyngeal fluid), your nose, or your throat using a swab. ? A sample of mucus from your lungs (sputum). ? Blood tests.  Imaging tests, which  may include, X-rays, CT scan, or ultrasound. How is this treated? At present, there is no medicine to treat COVID-19. Medicines that treat other diseases are being used on a trial basis to see if they are effective against COVID-19. Your health care provider will talk with you about ways to treat your symptoms. For most people, the infection is mild and can be managed at home with rest, fluids, and over-the-counter medicines. Treatment for a serious infection usually takes places in a hospital intensive care unit (ICU). It may include one or more of the following treatments. These treatments are given until your symptoms improve.  Receiving fluids and medicines through an IV.  Supplemental oxygen. Extra oxygen is given through a tube in the nose, a face mask, or a hood.  Positioning you to lie on your stomach (prone position). This makes it easier for oxygen to get into the lungs.  Continuous positive airway pressure (CPAP) or bi-level positive airway pressure (BPAP) machine. This treatment uses mild air pressure to keep the airways open. A tube that is connected to a motor delivers oxygen to the body.  Ventilator. This treatment moves air into and out of the lungs by using a tube that is placed in your windpipe.  Tracheostomy. This is a procedure to create a hole in the neck so that a breathing tube can be inserted.  Extracorporeal membrane oxygenation (ECMO). This procedure gives the lungs a chance to recover by taking over the functions of the heart and lungs. It supplies oxygen to the body and removes carbon dioxide. Follow these instructions at home: Lifestyle  If you are sick, stay home except to get medical care. Your health care provider will tell you how long to stay home. Call your health care provider before you go for medical care.  Rest at home as told by your health care provider.  Do not use any products that contain nicotine or tobacco, such as cigarettes, e-cigarettes, and  chewing tobacco. If you need help quitting, ask your health care provider.  Return to your normal activities as told by your health care provider. Ask your health care provider what activities are safe for you. General instructions  Take over-the-counter and prescription medicines only as told by your health care provider.  Drink enough fluid to keep your urine pale yellow.  Keep all follow-up visits as told by your health care provider. This is important. How is this prevented?  There is no vaccine to help prevent COVID-19 infection. However, there are steps you can take to protect yourself and others from this virus. To protect yourself:   Do not travel to areas where COVID-19 is a risk. The areas where COVID-19 is reported change often. To identify high-risk areas and travel restrictions, check the CDC travel website: wwwnc.cdc.gov/travel/notices  If you live in, or must travel to, an area where COVID-19 is a risk, take precautions to avoid infection. ? Stay away from people who are sick. ? Wash your hands often with soap and water for 20 seconds. If soap and water   are not available, use an alcohol-based hand sanitizer. ? Avoid touching your mouth, face, eyes, or nose. ? Avoid going out in public, follow guidance from your state and local health authorities. ? If you must go out in public, wear a cloth face covering or face mask. Make sure your mask covers your nose and mouth. ? Avoid crowded indoor spaces. Stay at least 6 feet (2 meters) away from others. ? Disinfect objects and surfaces that are frequently touched every day. This may include:  Counters and tables.  Doorknobs and light switches.  Sinks and faucets.  Electronics, such as phones, remote controls, keyboards, computers, and tablets. To protect others: If you have symptoms of COVID-19, take steps to prevent the virus from spreading to others.  If you think you have a COVID-19 infection, contact your health care  provider right away. Tell your health care team that you think you may have a COVID-19 infection.  Stay home. Leave your house only to seek medical care. Do not use public transport.  Do not travel while you are sick.  Wash your hands often with soap and water for 20 seconds. If soap and water are not available, use alcohol-based hand sanitizer.  Stay away from other members of your household. Let healthy household members care for children and pets, if possible. If you have to care for children or pets, wash your hands often and wear a mask. If possible, stay in your own room, separate from others. Use a different bathroom.  Make sure that all people in your household wash their hands well and often.  Cough or sneeze into a tissue or your sleeve or elbow. Do not cough or sneeze into your hand or into the air.  Wear a cloth face covering or face mask. Make sure your mask covers your nose and mouth. Where to find more information  Centers for Disease Control and Prevention: www.cdc.gov/coronavirus/2019-ncov/index.html  World Health Organization: www.who.int/health-topics/coronavirus Contact a health care provider if:  You live in or have traveled to an area where COVID-19 is a risk and you have symptoms of the infection.  You have had contact with someone who has COVID-19 and you have symptoms of the infection. Get help right away if:  You have trouble breathing.  You have pain or pressure in your chest.  You have confusion.  You have bluish lips and fingernails.  You have difficulty waking from sleep.  You have symptoms that get worse. These symptoms may represent a serious problem that is an emergency. Do not wait to see if the symptoms will go away. Get medical help right away. Call your local emergency services (911 in the [place] Do not drive yourself to the hospital. Let the emergency medical personnel know if you think you have COVID-19. Summary  COVID-19 is a  respiratory infection that is caused by a virus. It is also known as coronavirus disease or novel coronavirus. It can cause serious infections, such as pneumonia, acute respiratory distress syndrome, acute respiratory failure, or sepsis.  The virus that causes COVID-19 is contagious. This means that it can spread from person to person through droplets from breathing, speaking, singing, coughing, or sneezing.  You are more likely to develop a serious illness if you are 50 years of age or older, have a weak immune system, live in a nursing home, or have chronic disease.  There is no medicine to treat COVID-19. Your health care provider will talk with you about ways to treat your symptoms.    Take steps to protect yourself and others from infection. Wash your hands often and disinfect objects and surfaces that are frequently touched every day. Stay away from people who are sick and wear a mask if you are sick. This information is not intended to replace advice given to you by your health care provider. Make sure you discuss any questions you have with your health care provider. Document Revised: 09/16/2019 Document Reviewed: 12/23/2018 Elsevier Patient Education  2020 [employer] Inc.  

## 2019-12-10 ENCOUNTER — Encounter: Payer: Self-pay | Admitting: Family Medicine

## 2019-12-11 LAB — NOVEL CORONAVIRUS, NAA: SARS-CoV-2, NAA: NOT DETECTED

## 2019-12-12 ENCOUNTER — Ambulatory Visit: Payer: Managed Care, Other (non HMO) | Admitting: Family Medicine

## 2019-12-12 ENCOUNTER — Other Ambulatory Visit: Payer: Self-pay

## 2019-12-12 ENCOUNTER — Encounter: Payer: Self-pay | Admitting: Family Medicine

## 2019-12-12 VITALS — BP 158/94 | HR 110 | Temp 98.5°F | Resp 18 | Wt >= 6400 oz

## 2019-12-12 DIAGNOSIS — R8290 Unspecified abnormal findings in urine: Secondary | ICD-10-CM

## 2019-12-12 DIAGNOSIS — Z87442 Personal history of urinary calculi: Secondary | ICD-10-CM | POA: Diagnosis not present

## 2019-12-12 DIAGNOSIS — R109 Unspecified abdominal pain: Secondary | ICD-10-CM | POA: Diagnosis not present

## 2019-12-12 LAB — POC URINALSYSI DIPSTICK (AUTOMATED)
Bilirubin, UA: NEGATIVE
Glucose, UA: NEGATIVE
Ketones, UA: NEGATIVE
Nitrite, UA: NEGATIVE
Protein, UA: NEGATIVE
Spec Grav, UA: 1.01 (ref 1.010–1.025)
Urobilinogen, UA: 0.2 E.U./dL
pH, UA: 6.5 (ref 5.0–8.0)

## 2019-12-12 MED ORDER — TAMSULOSIN HCL 0.4 MG PO CAPS: 0.4000 mg | ORAL_CAPSULE | Freq: Every day | ORAL | 0 refills | Status: DC

## 2019-12-12 MED ORDER — ONDANSETRON HCL 4 MG PO TABS: 4.0000 mg | ORAL_TABLET | Freq: Three times a day (TID) | ORAL | 0 refills | Status: DC | PRN

## 2019-12-12 MED ORDER — CEPHALEXIN 500 MG PO CAPS: 500.0000 mg | ORAL_CAPSULE | Freq: Four times a day (QID) | ORAL | 0 refills | Status: DC

## 2019-12-12 MED ORDER — NAPROXEN 500 MG PO TABS: 500.0000 mg | ORAL_TABLET | Freq: Two times a day (BID) | ORAL | 0 refills | Status: DC

## 2019-12-12 NOTE — Patient Instructions (Addendum)
We will call you with lab results Start keflex every 6 hours.  Start naproxyn every 12 hours (with food).  Start zofran for nausea every 8 hours.  Start flomax once daily next 3-5 days.  Monitor for passage of stone.   HYDRATE!   If pain worsens or fever. Be seen immediately.

## 2019-12-12 NOTE — Telephone Encounter (Signed)
Please advise 

## 2019-12-12 NOTE — Telephone Encounter (Signed)
Hi Teresa Osborne.  You have had a trace protein every sample since your kidney stone. This can be seen with any inflammation of any type ( infection, stones etc) and has been stable for you.  If it were to increase it would cause concern at that time. You can also speak to your urologist concerning the protein.

## 2019-12-12 NOTE — Progress Notes (Signed)
Patient Care Team    Relationship Specialty Notifications Start End  Ma Hillock, DO PCP - General Family Medicine  01/09/16   Cleon Gustin, MD Consulting Physician Urology  12/12/19     SUBJECTIVE Chief Complaint  Patient presents with  . Follow-up    [id number] Tuley is a 44 y.o. female present for acute illness.   Patient first presented December 22/2020 with low back discomfort.  She was provided with Keflex prescription.  Urinalysis was normal.  She reports she did have improvement in her symptoms.  She then presented on December 09, 2019 with headache, nausea, chills and fever.  Covid testing was negative.  Today she states she still having nausea, occasional dizziness, decreased appetite, headache and low back pain along with flank pain.  She has a history of left-sided kidney stones.  She states that feels like when those were beginning.  She is established with urology.  Prior note:  She reports headache and body aches that started on Tuesday, January 5.  Yesterday she had a chills and a fever and felt more fatigued.  She also endorses some nausea.  She has developed a mild cough over this course.  She denies any shortness of breath, sinus pressure, nasal congestion, vomit or diarrhea.  She has not been exposed to any sick contacts that she is aware of.  She is working from home.  She did drive her daughter to her friends over the weekend to allow them to [employer]  Her daughter is not showing signs of illness.  ROS: See pertinent positives and negatives per HPI.  Patient Active Problem List   Diagnosis Date Noted  . Arthritis of knee 09/27/2019  . Morbid obesity with BMI of 70 and over, adult ([place]) 09/27/2019  . Cervical high risk HPV (human papillomavirus) test positive 06/22/2018  . History of renal calculi 10/27/2017  . Vitamin D deficiency 01/10/2016  . Encounter for IUD insertion 03/30/2013    Social History   Tobacco Use  . Smoking status: Never  Smoker  . Smokeless tobacco: Never Used  Substance Use Topics  . Alcohol use: No    Current Outpatient Medications:  .  buPROPion (WELLBUTRIN SR) 150 MG 12 hr tablet, 1 tab daily for 3 days, then increase to 1 tab BID, Disp: 60 tablet, Rfl: 5 .  Cholecalciferol (VITAMIN D) 50 MCG (2000 UT) CAPS, Take 1 capsule by mouth daily., Disp: , Rfl:  .  diclofenac (VOLTAREN) 75 MG EC tablet, Take 1 tablet (75 mg total) by mouth 2 (two) times daily., Disp: 60 tablet, Rfl: 5 .  levonorgestrel (MIRENA) 20 MCG/24HR IUD, 1 each once by Intrauterine route., Disp: , Rfl:  .  levothyroxine (SYNTHROID) 25 MCG tablet, Take 1 tablet (25 mcg total) by mouth daily., Disp: 90 tablet, Rfl: 0 .  Vitamin D, Ergocalciferol, (DRISDOL) 1.25 MG (50000 UT) CAPS capsule, Take 1 capsule (50,000 Units total) by mouth every 7 (seven) days., Disp: 12 capsule, Rfl: 0 .  cephALEXin (KEFLEX) 500 MG capsule, Take 1 capsule (500 mg total) by mouth 4 (four) times daily., Disp: 28 capsule, Rfl: 0 .  naproxen (NAPROSYN) 500 MG tablet, Take 1 tablet (500 mg total) by mouth 2 (two) times daily with a meal., Disp: 30 tablet, Rfl: 0 .  ondansetron (ZOFRAN) 4 MG tablet, Take 1 tablet (4 mg total) by mouth every 8 (eight) hours as needed for nausea or vomiting., Disp: 20 tablet, Rfl: 0 .  tamsulosin (FLOMAX) 0.4 MG  CAPS capsule, Take 1 capsule (0.4 mg total) by mouth daily., Disp: 30 capsule, Rfl: 0  No Known Allergies  OBJECTIVE: BP (!) 158/94 (BP Location: Left Arm, Patient Position: Sitting, Cuff Size: Large)   Pulse (!) 110   Temp 98.5 F (36.9 C) (Temporal)   Resp 18   Wt (!) 475 lb (215.5 kg)   SpO2 98%   BMI 76.67 kg/m  Gen: Afebrile. No acute distress.  HENT: AT. [place]. Eyes:Pupils Equal Round Reactive to light, Extraocular movements intact,  Conjunctiva without redness, discharge or icterus. CV: mildly tachy Chest: CTAB, no wheeze or crackles Abd: Soft. Mild suprapubic pressure. ND. BS present.   MSK: no CVA tenderness.    Neuro: Normal gait. PERLA. EOMi. Alert. Oriented x3  Psych: Normal affect, dress and demeanor. Normal speech. Normal thought content and judgment.   Results for orders placed or performed in visit on 12/12/19 (from the past 24 hour(s))  POCT Urinalysis Dipstick (Automated)     Status: Abnormal   Collection Time: 12/12/19  2:46 PM  Result Value Ref Range   Color, UA yellow    Clarity, UA clear    Glucose, UA Negative Negative   Bilirubin, UA negative    Ketones, UA negative    Spec Grav, UA 1.010 1.010 - 1.025   Blood, UA 1+    pH, UA 6.5 5.0 - 8.0   Protein, UA Negative Negative   Urobilinogen, UA 0.2 0.2 or 1.0 E.U./dL   Nitrite, UA negative    Leukocytes, UA Small (1+) (A) Negative    ASSESSMENT AND PLAN: Teresa Osborne is a 44 y.o. female present for  Nonintractable headache, unspecified chronicity pattern, unspecified headache type/left flank pain/nausea - rest. hydrate.  - POCT urine + blood and leuks. > sent for micro with culture.  - CBC, BMP collected today.  Elected to start keflex.  zofran for nausea.  naproxen BID with food.  - Novel Coronavirus> negative last week.  flomax start next 3-5 days.   If pain worsens or fever> be seen immediately. Otherwise will await results and consider CT renal stone study depending upon results and clinical outcome.   Elevated BP:   Proved on repeat but still above goal.  Could be secondary to pain today.  Will reevaluate on follow-up and start medication if needed.      Orders Placed This Encounter  Procedures  . Urinalysis with Culture Reflex  . CBC w/Diff  . Basic Metabolic Panel (BMET)  . POCT Urinalysis Dipstick (Automated)   Meds ordered this encounter  Medications  . ondansetron (ZOFRAN) 4 MG tablet    Sig: Take 1 tablet (4 mg total) by mouth every 8 (eight) hours as needed for nausea or vomiting.    Dispense:  20 tablet    Refill:  0  . naproxen (NAPROSYN) 500 MG tablet    Sig: Take 1 tablet (500 mg total) by  mouth 2 (two) times daily with a meal.    Dispense:  30 tablet    Refill:  0  . cephALEXin (KEFLEX) 500 MG capsule    Sig: Take 1 capsule (500 mg total) by mouth 4 (four) times daily.    Dispense:  28 capsule    Refill:  0  . tamsulosin (FLOMAX) 0.4 MG CAPS capsule    Sig: Take 1 capsule (0.4 mg total) by mouth daily.    Dispense:  30 capsule    Refill:  0     Vivien Barretto Claiborne Billings, DO 12/12/2019

## 2019-12-13 LAB — CBC WITH DIFFERENTIAL/PLATELET
Basophils Absolute: 0.1 10*3/uL (ref 0.0–0.2)
Basos: 1 %
EOS (ABSOLUTE): 0.3 10*3/uL (ref 0.0–0.4)
Eos: 4 %
Hematocrit: 44.1 % (ref 34.0–46.6)
Hemoglobin: 14.8 g/dL (ref 11.1–15.9)
Immature Grans (Abs): 0 10*3/uL (ref 0.0–0.1)
Immature Granulocytes: 0 %
Lymphocytes Absolute: 2.8 10*3/uL (ref 0.7–3.1)
Lymphs: 33 %
MCH: 30.5 pg (ref 26.6–33.0)
MCHC: 33.6 g/dL (ref 31.5–35.7)
MCV: 91 fL (ref 79–97)
Monocytes Absolute: 0.6 10*3/uL (ref 0.1–0.9)
Monocytes: 7 %
Neutrophils Absolute: 4.6 10*3/uL (ref 1.4–7.0)
Neutrophils: 55 %
Platelets: 400 10*3/uL (ref 150–450)
RBC: 4.85 x10E6/uL (ref 3.77–5.28)
RDW: 12.5 % (ref 11.7–15.4)
WBC: 8.4 10*3/uL (ref 3.4–10.8)

## 2019-12-13 LAB — BASIC METABOLIC PANEL
BUN/Creatinine Ratio: 13 (ref 9–23)
BUN: 12 mg/dL (ref 6–24)
CO2: 24 mmol/L (ref 20–29)
Calcium: 9.3 mg/dL (ref 8.7–10.2)
Chloride: 100 mmol/L (ref 96–106)
Creatinine, Ser: 0.93 mg/dL (ref 0.57–1.00)
GFR calc Af Amer: 87 mL/min/{1.73_m2} (ref >59)
GFR calc non Af Amer: 76 mL/min/{1.73_m2} (ref >59)
Glucose: 78 mg/dL (ref 65–99)
Potassium: 5 mmol/L (ref 3.5–5.2)
Sodium: 136 mmol/L (ref 134–144)

## 2019-12-14 LAB — URINALYSIS W MICROSCOPIC + REFLEX CULTURE
Bacteria, UA: NONE SEEN /HPF
Bilirubin Urine: NEGATIVE
Glucose, UA: NEGATIVE
Hyaline Cast: NONE SEEN /LPF
Ketones, ur: NEGATIVE
Nitrites, Initial: NEGATIVE
Protein, ur: NEGATIVE
Specific Gravity, Urine: 1.005 (ref 1.001–1.03)
pH: 7 (ref 5.0–8.0)

## 2019-12-14 LAB — CULTURE INDICATED

## 2019-12-14 LAB — URINE CULTURE
MICRO NUMBER:: 10030937
SPECIMEN QUALITY:: ADEQUATE

## 2019-12-21 ENCOUNTER — Encounter: Payer: Self-pay | Admitting: Family Medicine

## 2019-12-21 ENCOUNTER — Ambulatory Visit (INDEPENDENT_AMBULATORY_CARE_PROVIDER_SITE_OTHER): Payer: Managed Care, Other (non HMO) | Admitting: Family Medicine

## 2019-12-21 ENCOUNTER — Other Ambulatory Visit: Payer: Self-pay

## 2019-12-21 VITALS — Ht 66.0 in

## 2019-12-21 DIAGNOSIS — R110 Nausea: Secondary | ICD-10-CM | POA: Diagnosis not present

## 2019-12-21 DIAGNOSIS — R109 Unspecified abdominal pain: Secondary | ICD-10-CM

## 2019-12-21 NOTE — Progress Notes (Signed)
VIRTUAL VISIT VIA VIDEO  I connected with [name] on 12/21/19 at  1:00 PM EST by a video enabled telemedicine application and verified that I am speaking with the correct person using two identifiers. Location patient: Home Location provider: [facility], Office Persons participating in the virtual visit: Patient, Dr. Claiborne Billings and R.Baker, LPN  I discussed the limitations of evaluation and management by telemedicine and the availability of in person appointments. The patient expressed understanding and agreed to proceed.     Patient Care Team    Relationship Specialty Notifications Start End  Natalia Leatherwood, DO PCP - General Family Medicine  01/09/16   Malen Gauze, MD Consulting Physician Urology  12/12/19     SUBJECTIVE Chief Complaint  Patient presents with  . Follow-up    Pt states every once in a while she feel some bilateral back pain, dull. No fever.     Teresa Osborne is a 44 y.o. female present for follow-up on flank pain: Patient reports today she feels much improved. Her urine culture did not show evidence of a UTI. She did take the Keflex as prescribed and Flomax. She does not know for certain if she passed a stone or not, but her pain has resolved. She states up until 2 days ago she did have some small twinges of discomfort on her flank, but nothing since. No additional fevers, chills, nausea or vomit since starting antibiotics.  Prior note: Patient first presented December 22/2020 with low back discomfort.  She was provided with Keflex prescription.  Urinalysis was normal.  She reports she did have improvement in her symptoms.  She then presented on December 09, 2019 with headache, nausea, chills and fever.  Covid testing was negative.  Today she states she still having nausea, occasional dizziness, decreased appetite, headache and low back pain along with flank pain.  She has a history of left-sided kidney stones.  She states that feels like  when those were beginning.  She is established with urology.  Prior note:  She reports headache and body aches that started on Tuesday, January 5.  Yesterday she had a chills and a fever and felt more fatigued.  She also endorses some nausea.  She has developed a mild cough over this course.  She denies any shortness of breath, sinus pressure, nasal congestion, vomit or diarrhea.  She has not been exposed to any sick contacts that she is aware of.  She is working from home.  She did drive her daughter to her friends over the weekend to allow them to [employer].  Her daughter is not showing signs of illness.  ROS: See pertinent positives and negatives per HPI.  Patient Active Problem List   Diagnosis Date Noted  . Arthritis of knee 09/27/2019  . Morbid obesity with BMI of 70 and over, adult (HCC) 09/27/2019  . Cervical high risk HPV (human papillomavirus) test positive 06/22/2018  . History of renal calculi 10/27/2017  . Vitamin D deficiency 01/10/2016  . Encounter for IUD insertion 03/30/2013    Social History   Tobacco Use  . Smoking status: Never Smoker  . Smokeless tobacco: Never Used  Substance Use Topics  . Alcohol use: No    Current Outpatient Medications:  .  buPROPion (WELLBUTRIN SR) 150 MG 12 hr tablet, 1 tab daily for 3 days, then increase to 1 tab BID, Disp: 60 tablet, Rfl: 5 .  cephALEXin (KEFLEX) 500 MG capsule, Take 1 capsule (500 mg total)  by mouth 4 (four) times daily., Disp: 28 capsule, Rfl: 0 .  Cholecalciferol (VITAMIN D) 50 MCG (2000 UT) CAPS, Take 1 capsule by mouth daily., Disp: , Rfl:  .  diclofenac (VOLTAREN) 75 MG EC tablet, Take 1 tablet (75 mg total) by mouth 2 (two) times daily., Disp: 60 tablet, Rfl: 5 .  levonorgestrel (MIRENA) 20 MCG/24HR IUD, 1 each once by Intrauterine route., Disp: , Rfl:  .  levothyroxine (SYNTHROID) 25 MCG tablet, Take 1 tablet (25 mcg total) by mouth daily., Disp: 90 tablet, Rfl: 0 .  naproxen (NAPROSYN) 500 MG tablet, Take 1  tablet (500 mg total) by mouth 2 (two) times daily with a meal., Disp: 30 tablet, Rfl: 0 .  ondansetron (ZOFRAN) 4 MG tablet, Take 1 tablet (4 mg total) by mouth every 8 (eight) hours as needed for nausea or vomiting., Disp: 20 tablet, Rfl: 0 .  tamsulosin (FLOMAX) 0.4 MG CAPS capsule, Take 1 capsule (0.4 mg total) by mouth daily., Disp: 30 capsule, Rfl: 0 .  Vitamin D, Ergocalciferol, (DRISDOL) 1.25 MG (50000 UT) CAPS capsule, Take 1 capsule (50,000 Units total) by mouth every 7 (seven) days., Disp: 12 capsule, Rfl: 0  No Known Allergies  OBJECTIVE: Ht 5\' 6"  (1.676 m)   BMI 76.67 kg/m  Gen: Afebrile. No acute distress. Looks well today. HENT: AT. [place].  Eyes:Pupils Equal Round Reactive to light, Extraocular movements intact,  Conjunctiva without redness, discharge or icterus. Skin: no rashes, purpura or petechiae.  Neuro:  Alert. Oriented.  Psych: Normal affect, dress and demeanor. Normal speech. Normal thought content and judgment..    No results found for this or any previous visit (from the past 24 hour(s)).  ASSESSMENT AND PLAN: [name] is a 44 y.o. female present for  left flank pain/nausea - rest. hydrate. Complete Keflex. Use naproxen as needed twice daily. - If pain returns use Flomax as discussed and call back in immediately. If pain quickly returns after finishing antibiotic/Flomax treatment she will call in and we will go ahead and order the renal stone study CT for her and follow-up after CT completed. Suspect there is a likely possibility of additional kidney stone that hopefully has passed, but may not have passed. Therefore if her symptoms return at 1 week quickly and starting with a CT. She reports understanding.       No orders of the defined types were placed in this encounter.  No orders of the defined types were placed in this encounter.    Howard Pouch, DO 12/21/2019

## 2020-07-12 ENCOUNTER — Ambulatory Visit: Payer: Managed Care, Other (non HMO) | Admitting: Family Medicine

## 2020-07-12 ENCOUNTER — Other Ambulatory Visit: Payer: Self-pay

## 2020-07-12 ENCOUNTER — Encounter: Payer: Self-pay | Admitting: Family Medicine

## 2020-07-12 VITALS — BP 139/79 | HR 69 | Ht 66.0 in | Wt >= 6400 oz

## 2020-07-12 DIAGNOSIS — E559 Vitamin D deficiency, unspecified: Secondary | ICD-10-CM

## 2020-07-12 DIAGNOSIS — Z131 Encounter for screening for diabetes mellitus: Secondary | ICD-10-CM | POA: Diagnosis not present

## 2020-07-12 DIAGNOSIS — Z6841 Body Mass Index (BMI) 40.0 and over, adult: Secondary | ICD-10-CM

## 2020-07-12 DIAGNOSIS — Z1152 Encounter for screening for COVID-19: Secondary | ICD-10-CM

## 2020-07-12 DIAGNOSIS — E039 Hypothyroidism, unspecified: Secondary | ICD-10-CM

## 2020-07-12 MED ORDER — LEVOTHYROXINE SODIUM 25 MCG PO TABS: 25.0000 ug | ORAL_TABLET | Freq: Every day | ORAL | 0 refills | Status: DC

## 2020-07-12 MED ORDER — VITAMIN D (ERGOCALCIFEROL) 1.25 MG (50000 UNIT) PO CAPS: 50000.0000 [IU] | ORAL_CAPSULE | ORAL | 0 refills | Status: DC

## 2020-07-12 NOTE — Progress Notes (Signed)
This visit occurred during the SARS-CoV-2 public health emergency.  Safety protocols were in place, including screening questions prior to the visit, additional usage of staff PPE, and extensive cleaning of exam room while observing appropriate contact time as indicated for disinfecting solutions.    Teresa Osborne , Mar 03, 1976, 44 y.o., female MRN: [id number] Patient Care Team    Relationship Specialty Notifications Start End  Natalia Leatherwood, DO PCP - General Family Medicine  01/09/16   Malen Gauze, MD Consulting Physician Urology  12/12/19     Chief Complaint  Patient presents with  . wellness screening for work     Subjective: Pt presents for an OV to have her employment wellness form and labs updated. She typically has this done through her employer. However secondary to COVID-19 pandemic these are no longer being offered through her employment. Her routine physical exams are scheduled in October. We'll complete necessary blood tests for her employer today.  Depression screen [facility] 2/9 07/12/2020 09/23/2019 09/15/2018 09/14/2017 09/11/2017  Decreased Interest 0 0 0 0 0  Down, Depressed, Hopeless 0 0 1 0 0  PHQ - 2 Score 0 0 1 0 0  Altered sleeping - - 0 - -  Tired, decreased energy - - 1 - -  Change in appetite - - 1 - -  Feeling bad or failure about yourself  - - 1 - -  Trouble concentrating - - 0 - -  Moving slowly or fidgety/restless - - 0 - -  Suicidal thoughts - - 0 - -  PHQ-9 Score - - 4 - -  Difficult doing work/chores - - Not difficult at all - -    No Known Allergies Social History   Social History Narrative   Married to Bailey. 2 children [name] in [place].   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   Drink caffeinated beverages, wears her seatbelt, wears a bike helmet.   Smoke detector in the home   Feels safe in her relationships   Past Medical History:  Diagnosis Date  . Anxiety   . History of kidney stones   . Kidney stones     [place]  . PONV (postoperative nausea and vomiting)    NOV 2018, JAN TREMORS LASTED ABOUT 15 MINUTES, LIKES SCOPOLOMINE PATCH; no issues wuth last kidney stone surgery   . Pre-diabetes   . Vaginal Pap smear, abnormal    Past Surgical History:  Procedure Laterality Date  . CYSTOSCOPY  10/27/2017   Procedure: CYSTOSCOPY FLEXIBLE;  Surgeon: Malen Gauze, MD;  Location: [facility];  Service: Urology;;  . Bluford Kaufmann WITH RETROGRADE PYELOGRAM, URETEROSCOPY AND STENT PLACEMENT Left 01/21/2018   Procedure: CYSTOSCOPY WITH RETROGRADE PYELOGRAM, URETEROSCOPY AND STENT PLACEMENT;  Surgeon: Malen Gauze, MD;  Location: [facility];  Service: Urology;  Laterality: Left;  . HOLMIUM LASER APPLICATION Left 01/21/2018   Procedure: HOLMIUM LASER APPLICATION;  Surgeon: Malen Gauze, MD;  Location: [facility];  Service: Urology;  Laterality: Left;  . IR URETERAL STENT LEFT NEW ACCESS W/O SEP NEPHROSTOMY CATH  02/18/2018  . NEPHROLITHOTOMY Left 12/07/2017   Procedure: LEFT NEPHROLITHOTOMY PERCUTANEOUS AND STENT PLACEMENT;  Surgeon: Malen Gauze, MD;  Location: [facility];  Service: Urology;  Laterality: Left;  . NEPHROLITHOTOMY Left 02/19/2018   Procedure: NEPHROLITHOTOMY PERCUTANEOUS;  Surgeon: Malen Gauze, MD;  Location: [facility];  Service: Urology;  Laterality: Left;  . NEPHROLITHOTOMY Left 02/25/2018   Procedure: NEPHROLITHOTOMY PERCUTANEOUS SECOND LOOK;  Surgeon: Wilkie Aye  L, MD;  Location: [facility];  Service: Urology;  Laterality: Left;  . NEPHROSTOMY Left 10/27/2017   Procedure: NEPHROSTOMY TUBE PLACEMENT;  Surgeon: Malen Gauze, MD;  Location: [facility];  Service: Urology;  Laterality: Left;   Family History  Problem Relation Age of Onset  . Cancer Paternal Grandfather        pancreatic  . Alcohol abuse Paternal Grandfather   . Arthritis Paternal Grandfather   . COPD Paternal Grandfather   . Early death Paternal Grandfather   . Cancer Paternal Grandmother        unknown,  metz  . Arthritis Paternal Grandmother   . COPD Paternal Grandmother   . Early death Paternal Grandmother   . Arthritis Father   . Mental retardation Father   . Arthritis Maternal Grandmother   . COPD Maternal Grandfather   . Heart disease Maternal Grandfather   . Cancer Sister 33       ovarian   Allergies as of 07/12/2020   No Known Allergies     Medication List       Accurate as of July 12, 2020 10:22 AM. If you have any questions, ask your nurse or doctor.        STOP taking these medications   cephALEXin 500 MG capsule Commonly known as: KEFLEX Stopped by: Felix Pacini, DO   diclofenac 75 MG EC tablet Commonly known as: VOLTAREN Stopped by: Felix Pacini, DO   naproxen 500 MG tablet Commonly known as: Naprosyn Stopped by: Felix Pacini, DO   ondansetron 4 MG tablet Commonly known as: ZOFRAN Stopped by: Felix Pacini, DO   tamsulosin 0.4 MG Caps capsule Commonly known as: FLOMAX Stopped by: Felix Pacini, DO     TAKE these medications   buPROPion 150 MG 12 hr tablet Commonly known as: Wellbutrin SR 1 tab daily for 3 days, then increase to 1 tab BID   levonorgestrel 20 MCG/24HR IUD Commonly known as: MIRENA 1 each once by Intrauterine route.   levothyroxine 25 MCG tablet Commonly known as: SYNTHROID Take 1 tablet (25 mcg total) by mouth daily.   Vitamin D (Ergocalciferol) 1.25 MG (50000 UNIT) Caps capsule Commonly known as: DRISDOL Take 1 capsule (50,000 Units total) by mouth every 7 (seven) days.   Vitamin D 50 MCG (2000 UT) Caps Take 1 capsule by mouth daily.       All past medical history, surgical history, allergies, family history, immunizations andmedications were updated in the EMR today and reviewed under the history and medication portions of their EMR.     ROS: Negative, with the exception of above mentioned in HPI   Objective:  BP 139/79 (BP Location: Left Arm, Patient Position: Sitting, Cuff Size: Normal)   Pulse 69   Ht 5\' 6"   (1.676 m)   Wt (!) 427 lb (193.7 kg)   SpO2 97%   BMI 68.92 kg/m  Body mass index is 68.92 kg/m. Gen: Afebrile. No acute distress. Nontoxic in appearance, well developed, well nourished. Morbidly obese HENT: AT. [place]. Eyes:Pupils Equal Round Reactive to light, Extraocular movements intact,  Conjunctiva without redness, discharge or icterus. Neck/lymp/endocrine: Supple,no lymphadenopathy CV: RRR no murmur, no edema Chest: CTAB, no wheeze or crackles. Good air movement, normal resp effort.  Neuro:  Normal gait. PERLA. EOMi. Alert. Oriented x3  Psych: Normal affect, dress and demeanor. Normal speech. Normal thought content and judgment.  No exam data present No results found. No results found for this or any previous visit (from the past  24 hour(s)).  Assessment/Plan: Teresa Osborne is a 44 y.o. female present for OV for  Morbid obesity with BMI of 70 and over, adult (HCC) Diet and exercise encouraged.  She had tried Wellbutrin and it was not effective. - Lipid panel> required for employment wellness form  Diabetes mellitus screening Required for employment wellness form - Basic Metabolic Panel (BMET) - Hemoglobin A1c  Encounter for screening for COVID-19 Required for employment wellness form - SARS-CoV-2 Semi-Quantitative Total Antibody, Spike  Vitamin D deficiency Encouraged patient to restart the once weekly vitamin D supplementation. Labs will be collected at her CPE in October  Hypothyroidism: Encouraged patient to restart her levothyroxine 25 mcg daily. Labs will be collected at her CPE in October.   Reviewed expectations re: course of current medical issues.  Discussed self-management of symptoms.  Outlined signs and symptoms indicating need for more acute intervention.  Patient verbalized understanding and all questions were answered.  Patient received an After-Visit Summary.    No orders of the defined types were placed in this encounter.  No orders of  the defined types were placed in this encounter.  Referral Orders  No referral(s) requested today     Note is dictated utilizing voice recognition software. Although note has been proof read prior to signing, occasional typographical errors still can be missed. If any questions arise, please do not hesitate to call for verification.   electronically signed by:  Felix Pacini, DO  [facility]

## 2020-07-12 NOTE — Patient Instructions (Addendum)
Restart thyroid med.  Schedule physical 12/25/2019 (week of) We will call you with lab results and complete form for you.

## 2020-07-13 LAB — LIPID PANEL
Chol/HDL Ratio: 3.8 ratio (ref 0.0–4.4)
Cholesterol, Total: 177 mg/dL (ref 100–199)
HDL: 46 mg/dL (ref >39)
LDL Chol Calc (NIH): 117 mg/dL — ABNORMAL HIGH (ref 0–99)
Triglycerides: 74 mg/dL (ref 0–149)
VLDL Cholesterol Cal: 14 mg/dL (ref 5–40)

## 2020-07-13 LAB — BASIC METABOLIC PANEL
BUN/Creatinine Ratio: 11 (ref 9–23)
BUN: 11 mg/dL (ref 6–24)
CO2: 23 mmol/L (ref 20–29)
Calcium: 9 mg/dL (ref 8.7–10.2)
Chloride: 103 mmol/L (ref 96–106)
Creatinine, Ser: 0.97 mg/dL (ref 0.57–1.00)
GFR calc Af Amer: 82 mL/min/{1.73_m2} (ref >59)
GFR calc non Af Amer: 71 mL/min/{1.73_m2} (ref >59)
Glucose: 84 mg/dL (ref 65–99)
Potassium: 4.9 mmol/L (ref 3.5–5.2)
Sodium: 138 mmol/L (ref 134–144)

## 2020-07-13 LAB — HEMOGLOBIN A1C
Est. average glucose Bld gHb Est-mCnc: 111 mg/dL
Hgb A1c MFr Bld: 5.5 % (ref 4.8–5.6)

## 2020-07-13 LAB — SARS-COV-2 SEMI-QUANTITATIVE TOTAL ANTIBODY, SPIKE
SARS-CoV-2 Semi-Quant Total Ab: <0.4 U/mL (ref <0.8)
SARS-CoV-2 Spike Ab Interp: NEGATIVE

## 2020-09-25 ENCOUNTER — Encounter: Payer: Managed Care, Other (non HMO) | Admitting: Family Medicine

## 2021-02-13 ENCOUNTER — Other Ambulatory Visit: Payer: Self-pay | Admitting: Family Medicine

## 2021-02-15 ENCOUNTER — Other Ambulatory Visit: Payer: Self-pay | Admitting: Family Medicine

## 2021-03-26 ENCOUNTER — Other Ambulatory Visit: Payer: Self-pay | Admitting: Family Medicine

## 2021-06-25 ENCOUNTER — Other Ambulatory Visit: Payer: Self-pay | Admitting: Family Medicine

## 2021-07-05 ENCOUNTER — Ambulatory Visit (INDEPENDENT_AMBULATORY_CARE_PROVIDER_SITE_OTHER): Payer: Managed Care, Other (non HMO) | Admitting: Family Medicine

## 2021-07-05 ENCOUNTER — Other Ambulatory Visit: Payer: Self-pay

## 2021-07-05 ENCOUNTER — Encounter: Payer: Self-pay | Admitting: Family Medicine

## 2021-07-05 VITALS — BP 158/85 | HR 87 | Temp 98.2°F | Ht 65.5 in | Wt >= 6400 oz

## 2021-07-05 DIAGNOSIS — Z6841 Body Mass Index (BMI) 40.0 and over, adult: Secondary | ICD-10-CM | POA: Diagnosis not present

## 2021-07-05 DIAGNOSIS — Z130 Encounter for screening for diseases of the blood and blood-forming organs and certain disorders involving the immune mechanism: Secondary | ICD-10-CM | POA: Diagnosis not present

## 2021-07-05 DIAGNOSIS — Z1159 Encounter for screening for other viral diseases: Secondary | ICD-10-CM

## 2021-07-05 DIAGNOSIS — Z1231 Encounter for screening mammogram for malignant neoplasm of breast: Secondary | ICD-10-CM | POA: Diagnosis not present

## 2021-07-05 DIAGNOSIS — E039 Hypothyroidism, unspecified: Secondary | ICD-10-CM

## 2021-07-05 DIAGNOSIS — Z0001 Encounter for general adult medical examination with abnormal findings: Secondary | ICD-10-CM | POA: Diagnosis not present

## 2021-07-05 DIAGNOSIS — I10 Essential (primary) hypertension: Secondary | ICD-10-CM | POA: Diagnosis not present

## 2021-07-05 DIAGNOSIS — Z131 Encounter for screening for diabetes mellitus: Secondary | ICD-10-CM

## 2021-07-05 DIAGNOSIS — Z1211 Encounter for screening for malignant neoplasm of colon: Secondary | ICD-10-CM | POA: Diagnosis not present

## 2021-07-05 DIAGNOSIS — E559 Vitamin D deficiency, unspecified: Secondary | ICD-10-CM

## 2021-07-05 DIAGNOSIS — R319 Hematuria, unspecified: Secondary | ICD-10-CM

## 2021-07-05 LAB — CBC WITH DIFFERENTIAL/PLATELET
Basophils Absolute: 0.1 10*3/uL (ref 0.0–0.1)
Basophils Relative: 1.1 % (ref 0.0–3.0)
Eosinophils Absolute: 0.2 10*3/uL (ref 0.0–0.7)
Eosinophils Relative: 3.4 % (ref 0.0–5.0)
HCT: 44.1 % (ref 36.0–46.0)
Hemoglobin: 14.7 g/dL (ref 12.0–15.0)
Lymphocytes Relative: 28.3 % (ref 12.0–46.0)
Lymphs Abs: 2 10*3/uL (ref 0.7–4.0)
MCHC: 33.4 g/dL (ref 30.0–36.0)
MCV: 92.6 fl (ref 78.0–100.0)
Monocytes Absolute: 0.5 10*3/uL (ref 0.1–1.0)
Monocytes Relative: 6.6 % (ref 3.0–12.0)
Neutro Abs: 4.4 10*3/uL (ref 1.4–7.7)
Neutrophils Relative %: 60.6 % (ref 43.0–77.0)
Platelets: 340 10*3/uL (ref 150.0–400.0)
RBC: 4.76 Mil/uL (ref 3.87–5.11)
RDW: 13.4 % (ref 11.5–15.5)
WBC: 7.2 10*3/uL (ref 4.0–10.5)

## 2021-07-05 LAB — COMPREHENSIVE METABOLIC PANEL
ALT: 14 U/L (ref 0–35)
AST: 13 U/L (ref 0–37)
Albumin: 4.2 g/dL (ref 3.5–5.2)
Alkaline Phosphatase: 72 U/L (ref 39–117)
BUN: 16 mg/dL (ref 6–23)
CO2: 24 mEq/L (ref 19–32)
Calcium: 9.7 mg/dL (ref 8.4–10.5)
Chloride: 102 mEq/L (ref 96–112)
Creatinine, Ser: 1 mg/dL (ref 0.40–1.20)
GFR: 68.09 mL/min (ref >60.00)
Glucose, Bld: 92 mg/dL (ref 70–99)
Potassium: 4.8 mEq/L (ref 3.5–5.1)
Sodium: 134 mEq/L — ABNORMAL LOW (ref 135–145)
Total Bilirubin: 0.4 mg/dL (ref 0.2–1.2)
Total Protein: 7.6 g/dL (ref 6.0–8.3)

## 2021-07-05 LAB — LIPID PANEL
Cholesterol: 205 mg/dL — ABNORMAL HIGH (ref 0–200)
HDL: 61.9 mg/dL (ref >39.00)
LDL Cholesterol: 128 mg/dL — ABNORMAL HIGH (ref 0–99)
NonHDL: 143.29
Total CHOL/HDL Ratio: 3
Triglycerides: 74 mg/dL (ref 0.0–149.0)
VLDL: 14.8 mg/dL (ref 0.0–40.0)

## 2021-07-05 LAB — HEMOGLOBIN A1C: Hgb A1c MFr Bld: 5.5 % (ref 4.6–6.5)

## 2021-07-05 LAB — T4, FREE: Free T4: 0.81 ng/dL (ref 0.60–1.60)

## 2021-07-05 LAB — TSH: TSH: 5.24 u[IU]/mL (ref 0.35–5.50)

## 2021-07-05 LAB — VITAMIN D 25 HYDROXY (VIT D DEFICIENCY, FRACTURES): VITD: 25.2 ng/mL — ABNORMAL LOW (ref 30.00–100.00)

## 2021-07-05 MED ORDER — LISINOPRIL-HYDROCHLOROTHIAZIDE 10-12.5 MG PO TABS: 1.0000 | ORAL_TABLET | Freq: Every day | ORAL | 0 refills | Status: DC

## 2021-07-05 NOTE — Patient Instructions (Signed)
Health Maintenance, Female Adopting a healthy lifestyle and getting preventive care are important in promoting health and wellness. Ask your health care provider about: The right schedule for you to have regular tests and exams. Things you can do on your own to prevent diseases and keep yourself healthy. What should I know about diet, weight, and exercise? Eat a healthy diet  Eat a diet that includes plenty of vegetables, fruits, low-fat dairy products, and lean protein. Do not eat a lot of foods that are high in solid fats, added sugars, or sodium.  Maintain a healthy weight Body mass index (BMI) is used to identify weight problems. It estimates body fat based on height and weight. Your health care provider can help determineyour BMI and help you achieve or maintain a healthy weight. Get regular exercise Get regular exercise. This is one of the most important things you can do for your health. Most adults should: Exercise for at least 150 minutes each week. The exercise should increase your heart rate and make you sweat (moderate-intensity exercise). Do strengthening exercises at least twice a week. This is in addition to the moderate-intensity exercise. Spend less time sitting. Even light physical activity can be beneficial. Watch cholesterol and blood lipids Have your blood tested for lipids and cholesterol at 45 years of age, then havethis test every 5 years. Have your cholesterol levels checked more often if: Your lipid or cholesterol levels are high. You are older than 45 years of age. You are at high risk for heart disease. What should I know about cancer screening? Depending on your health history and family history, you may need to have cancer screening at various ages. This may include screening for: Breast cancer. Cervical cancer. Colorectal cancer. Skin cancer. Lung cancer. What should I know about heart disease, diabetes, and high blood pressure? Blood pressure and heart  disease High blood pressure causes heart disease and increases the risk of stroke. This is more likely to develop in people who have high blood pressure readings, are of African descent, or are overweight. Have your blood pressure checked: Every 3-5 years if you are 18-39 years of age. Every year if you are 40 years old or older. Diabetes Have regular diabetes screenings. This checks your fasting blood sugar level. Have the screening done: Once every three years after age 40 if you are at a normal weight and have a low risk for diabetes. More often and at a younger age if you are overweight or have a high risk for diabetes. What should I know about preventing infection? Hepatitis B If you have a higher risk for hepatitis B, you should be screened for this virus. Talk with your health care provider to find out if you are at risk forhepatitis B infection. Hepatitis C Testing is recommended for: Everyone born from 1945 through 1965. Anyone with known risk factors for hepatitis C. Sexually transmitted infections (STIs) Get screened for STIs, including gonorrhea and chlamydia, if: You are sexually active and are younger than 45 years of age. You are older than 45 years of age and your health care provider tells you that you are at risk for this type of infection. Your sexual activity has changed since you were last screened, and you are at increased risk for chlamydia or gonorrhea. Ask your health care provider if you are at risk. Ask your health care provider about whether you are at high risk for HIV. Your health care provider may recommend a prescription medicine to help   prevent HIV infection. If you choose to take medicine to prevent HIV, you should first get tested for HIV. You should then be tested every 3 months for as long as you are taking the medicine. Pregnancy If you are about to stop having your period (premenopausal) and you may become pregnant, seek counseling before you get  pregnant. Take 400 to 800 micrograms (mcg) of folic acid every day if you become pregnant. Ask for birth control (contraception) if you want to prevent pregnancy. Osteoporosis and menopause Osteoporosis is a disease in which the bones lose minerals and strength with aging. This can result in bone fractures. If you are 26 years old or older, or if you are at risk for osteoporosis and fractures, ask your health care provider if you should: Be screened for bone loss. Take a calcium or vitamin D supplement to lower your risk of fractures. Be given hormone replacement therapy (HRT) to treat symptoms of menopause. Follow these instructions at home: Lifestyle Do not use any products that contain nicotine or tobacco, such as cigarettes, e-cigarettes, and chewing tobacco. If you need help quitting, ask your health care provider. Do not use street drugs. Do not share needles. Ask your health care provider for help if you need support or information about quitting drugs. Alcohol use Do not drink alcohol if: Your health care provider tells you not to drink. You are pregnant, may be pregnant, or are planning to become pregnant. If you drink alcohol: Limit how much you use to 0-1 drink a day. Limit intake if you are breastfeeding. Be aware of how much alcohol is in your drink. In the [place] one drink equals one 12 oz bottle of beer (355 mL), one 5 oz glass of wine (148 mL), or one 1 oz glass of hard liquor (44 mL). General instructions Schedule regular health, dental, and eye exams. Stay current with your vaccines. Tell your health care provider if: You often feel depressed. You have ever been abused or do not feel safe at home. Summary Adopting a healthy lifestyle and getting preventive care are important in promoting health and wellness. Follow your health care provider's instructions about healthy diet, exercising, and getting tested or screened for diseases. Follow your health care provider's  instructions on monitoring your cholesterol and blood pressure.  This information is not intended to replace advice given to you by your health care provider. Make sure you discuss any questions you have with your healthcare provider.  If you are age 55 or older, your body mass index should be between 23-30. Your Body mass index is Body mass index is 75.55 kg/m.Marland Kitchen If this is above the aforementioned range listed, you are consider overweight and if BMI > 35 you are conisdered obese, by medical definition and standards. Routine daily exercise and dietary modifications are encouraged. If you would like additional guidance on weight loss, please make an appointment for weight loss counseling - must be an appointment dedicate to weight loss counseling alone. I would be happy to help you.   If you are age 68 or younger, your body mass index should be between 19-25. Your Body mass index is Body mass index is 75.55 kg/m. Marland Kitchen If this is above the aformentioned range listed, you are consider overwieght and obese if BMI > 30, by medical definition and standards. Routine daily exercise and dietary modifications are encouraged. If you would like additional guidance on weight loss, please make an appointment for weight loss counseling - must be an  appointment dedicate to weight loss counseling alone. I would be happy to help you.  Document Revised: 11/10/2018 Document Reviewed: 11/10/2018 Elsevier Patient Education  2022 [employer].

## 2021-07-05 NOTE — Progress Notes (Signed)
This visit occurred during the SARS-CoV-2 public health emergency.  Safety protocols were in place, including screening questions prior to the visit, additional usage of staff PPE, and extensive cleaning of exam room while observing appropriate contact time as indicated for disinfecting solutions.    Patient ID: Teresa Osborne, female  DOB: 02-25-1976, 45 y.o.   MRN: [id number] Patient Care Team    Relationship Specialty Notifications Start End  Natalia LeatherwoodKuneff, Jo Cerone A, DO PCP - General Family Medicine  01/09/16   Malen GauzeMcKenzie, Patrick L, MD Consulting Physician Urology  12/12/19     Chief Complaint  Patient presents with   Annual Exam    Pt is fasting     Subjective: Teresa Osborne is a 45 y.o.  Female  present for CPE. All past medical history, surgical history, allergies, family history, immunizations, medications and social history were updated in the electronic medical record today. All recent labs, ED visits and hospitalizations within the last year were reviewed.  Health maintenance:  Colonoscopy: No fhx. Screen due at 45 > cologuard Mammogram: No known history of breast cancer in the family. Screening ordered at breast center every year since 2020> pt has yet to schedule.  Cervical cancer screening: 05/2018- HPV positive- one year follow up>> she states she will make the appt- [facility] 2022 Immunizations: tdap updated 2018, declines flu shot- encouraged yearly. Declined covid vaccine.  Infectious disease screening: HIV completed  2008. Hep C collected today DEXA: n/a Assistive device: none Oxygen WJX:BJYNuse:none Patient has a Dental home. Hospitalizations/ED visits: reviewed  Elevated BP> new HTN: Pt BP has been consistently elevated over last few visits.  Patient denies chest pain, shortness of breath or lower extremity edema.  RF: HTN, morbid obesity, HD fhx  Hematuria: Pt reports she was seen in the UC for urinary frequency. She was told she did not have a UTI- they told  her not a bacteris that causes uti was present, hematuria and protein. She is not certain what  that meant.   [facility] 07/05/2021 07/12/2020 09/23/2019 09/15/2018 09/14/2017  Decreased Interest 0 0 0 0 0  Down, Depressed, Hopeless 0 0 0 1 0  PHQ - 2 Score 0 0 0 1 0  Altered sleeping - - - 0 -  Tired, decreased energy - - - 1 -  Change in appetite - - - 1 -  Feeling bad or failure about yourself  - - - 1 -  Trouble concentrating - - - 0 -  Moving slowly or fidgety/restless - - - 0 -  Suicidal thoughts - - - 0 -  PHQ-9 Score - - - 4 -  Difficult doing work/chores - - - Not difficult at all -   No flowsheet data found.   Immunization History  Administered Date(s) Administered   Tdap 12/01/2006, 09/14/2017    Past Medical History:  Diagnosis Date   Anxiety    History of kidney stones    Kidney stones    Staghorn   PONV (postoperative nausea and vomiting)    NOV 2018, JAN TREMORS LASTED ABOUT 15 MINUTES, LIKES SCOPOLOMINE PATCH; no issues wuth last kidney stone surgery    Pre-diabetes    Vaginal Pap smear, abnormal    No Known Allergies Past Surgical History:  Procedure Laterality Date   CYSTOSCOPY  10/27/2017   Procedure: CYSTOSCOPY FLEXIBLE;  Surgeon: Malen GauzeMcKenzie, Patrick L, MD;  Location: [facility];  Service: Urology;;   CYSTOSCOPY WITH RETROGRADE PYELOGRAM, URETEROSCOPY AND STENT PLACEMENT  Left 01/21/2018   Procedure: CYSTOSCOPY WITH RETROGRADE PYELOGRAM, URETEROSCOPY AND STENT PLACEMENT;  Surgeon: Malen Gauze, MD;  [facility];  Service: Urology;  Laterality: Left;   HOLMIUM LASER APPLICATION Left 01/21/2018   Procedure: HOLMIUM LASER APPLICATION;  Surgeon: Malen Gauze, MD;  [facility];  Service: Urology;  Laterality: Left;   IR URETERAL STENT LEFT NEW ACCESS W/O SEP NEPHROSTOMY CATH  02/18/2018   NEPHROLITHOTOMY Left 12/07/2017   Procedure: LEFT NEPHROLITHOTOMY PERCUTANEOUS AND STENT PLACEMENT;  Surgeon: Malen Gauze, MD;  Location:  [facility];  Service: Urology;  Laterality: Left;   NEPHROLITHOTOMY Left 02/19/2018   Procedure: NEPHROLITHOTOMY PERCUTANEOUS;  Surgeon: Malen Gauze, MD;  [facility];  Service: Urology;  Laterality: Left;   NEPHROLITHOTOMY Left 02/25/2018   Procedure: NEPHROLITHOTOMY PERCUTANEOUS SECOND LOOK;  Surgeon: Malen Gauze, MD;  [facility];  Service: Urology;  Laterality: Left;   NEPHROSTOMY Left 10/27/2017   Procedure: NEPHROSTOMY TUBE PLACEMENT;  Surgeon: Malen Gauze, MD;  [facility];  Service: Urology;  Laterality: Left;   Family History  Problem Relation Age of Onset   Cancer Paternal Grandfather        pancreatic   Alcohol abuse Paternal Grandfather    Arthritis Paternal Grandfather    COPD Paternal Grandfather    Early death Paternal Grandfather    Cancer Paternal Grandmother        unknown, metz   Arthritis Paternal Grandmother    COPD Paternal Grandmother    Early death Paternal Grandmother    Arthritis Father    Mental retardation Father    Arthritis Maternal Grandmother    COPD Maternal Grandfather    Heart disease Maternal Grandfather    Cancer Sister 50       ovarian   Social History   Social History Narrative   Married to [place]. 2 children [name] in [place].   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   Drink caffeinated beverages, wears her seatbelt, wears a bike helmet.   Smoke detector in the home   Feels safe in her relationships    Allergies as of 07/05/2021   No Known Allergies      Medication List        Accurate as of July 05, 2021  8:55 AM. If you have any questions, ask your nurse or doctor.          STOP taking these medications    Vitamin D (Ergocalciferol) 1.25 MG (50000 UNIT) Caps capsule Commonly known as: DRISDOL Stopped by: Felix Pacini, DO       TAKE these medications    levonorgestrel 20 MCG/24HR IUD Commonly known as: MIRENA 1 each once by Intrauterine route.    levothyroxine 25 MCG tablet Commonly known as: SYNTHROID TAKE 1 TABLET BY MOUTH EVERY DAY   lisinopril-hydrochlorothiazide 10-12.5 MG tablet Commonly known as: ZESTORETIC Take 1 tablet by mouth daily. Started by: Felix Pacini, DO   Vitamin D 50 MCG (2000 UT) Caps Take 1 capsule by mouth daily.        All past medical history, surgical history, allergies, family history, immunizations andmedications were updated in the EMR today and reviewed under the history and medication portions of their EMR.     No results found for this or any previous visit (from the past 2160 hour(s)).   ROS: 14 pt review of systems performed and negative (unless mentioned in an HPI)  Objective: BP (!) 158/85   Pulse 87  Temp 98.2 F (36.8 C) (Oral)   Ht 5' 5.5" (1.664 m)   Wt (!) 461 lb (209.1 kg)   SpO2 97%   BMI 75.55 kg/m  Gen: Afebrile. No acute distress. Nontoxic in appearance, well-developed, well-nourished,  morbidly obese pleasant female.  HENT: AT. [place]. Bilateral TM visualized and normal in appearance, normal external auditory canal. MMM, no oral lesions, adequate dentition. Bilateral nares within normal limits. Throat without erythema, ulcerations or exudates. no Cough on exam, no hoarseness on exam. Eyes:Pupils Equal Round Reactive to light, Extraocular movements intact,  Conjunctiva without redness, discharge or icterus. Neck/lymp/endocrine: Supple,no lymphadenopathy, no thyromegaly CV: RRR no murmur, no edema, +2/4 P posterior tibialis pulses.  Chest: CTAB, no wheeze, rhonchi or crackles. normal Respiratory effort. good Air movement. Abd: Soft. obese NTND. BS present. no Masses palpated. No hepatosplenomegaly. No rebound tenderness or guarding. Skin: no rashes, purpura or petechiae. Warm and well-perfused. Skin intact. Neuro/Msk: Normal gait. PERLA. EOMi. Alert. Oriented x3.  Cranial nerves II through XII intact. Muscle strength 5/5 upper/lower extremity. DTRs equal bilaterally. Psych:  Normal affect, dress and demeanor. Normal speech. Normal thought content and judgment.   No results found.  Assessment/plan: Bodhi Stenglein is a 45 y.o. female present for CPE/e3 Vitamin D deficiency Continue supplementation  - VITAMIN D 25 Hydroxy (Vit-D Deficiency, Fractures) Colon cancer screening - Cologuard Breast cancer screening by mammogram - MM 3D SCREEN BREAST BILATERAL; Future Diabetes mellitus screening - Hemoglobin A1c - Comprehensive metabolic panel Screening for deficiency anemia - CBC with Differential/Platelet Need for hepatitis C screening test - Hepatitis C Antibody  Hematuria, unspecified type New problem Seen at an UC last week told hematuria and protein - no UTI - Urinalysis w microscopic + reflex cultur  New HTN/Morbid obesity with BMI of 70 and over, adult (HCC) Start lisinopril-hctz 10-12.5 mg qd - increase exercise - low sodium diet.  - Lipid panel - f/u 4 weeks for new onset HTN  Hypothyroidism, unspecified type Continue levo 25 mcg qd> refills will be provided in appropriate dose after results received - TSH - T4, free  Encounter for general adult medical examination with abnormal findings Patient was encouraged to exercise greater than 150 minutes a week. Patient was encouraged to choose a diet filled with fresh fruits and vegetables, and lean meats. AVS provided to patient today for education/recommendation on gender specific health and safety maintenance. Colonoscopy: No fhx. Screen due at 45 > cologuard Mammogram: No known history of breast cancer in the family. Screening ordered at breast center every year since 2020> pt has yet to schedule.  Cervical cancer screening: 05/2018- HPV positive- one year follow up>> she states she will make the [facility] 2022 Immunizations: tdap updated 2018, declines flu shot- encouraged yearly. Declined covid vaccine.  Infectious disease screening: HIV completed  2008. Hep C collected  today DEXA: n/a Return in about 4 weeks (around 08/02/2021) for CMC (30 min) new htn, and 1 yr1d cpe.   Orders Placed This Encounter  Procedures   MM 3D SCREEN BREAST BILATERAL   CBC with Differential/Platelet   Hemoglobin A1c   Lipid panel   Comprehensive metabolic panel   TSH   VITAMIN D 25 Hydroxy (Vit-D Deficiency, Fractures)   T4, free   Cologuard   Urinalysis w microscopic + reflex cultur   Hepatitis C Antibody   Meds ordered this encounter  Medications   lisinopril-hydrochlorothiazide (ZESTORETIC) 10-12.5 MG tablet    Sig: Take 1 tablet by mouth daily.  Dispense:  90 tablet    Refill:  0   Referral Orders  No referral(s) requested today     Electronically signed by: Felix Pacini, DO [facility]

## 2021-07-08 ENCOUNTER — Telehealth: Payer: Self-pay | Admitting: Family Medicine

## 2021-07-08 MED ORDER — LEVOTHYROXINE SODIUM 50 MCG PO TABS: 50.0000 ug | ORAL_TABLET | Freq: Every day | ORAL | 0 refills | Status: DC

## 2021-07-08 MED ORDER — SULFAMETHOXAZOLE-TRIMETHOPRIM 800-160 MG PO TABS: 1.0000 | ORAL_TABLET | Freq: Two times a day (BID) | ORAL | 0 refills | Status: DC

## 2021-07-08 NOTE — Telephone Encounter (Signed)
Spoke with pt regarding labs and instructions.   

## 2021-07-08 NOTE — Telephone Encounter (Signed)
Please call patient -Liver and kidney function are normal. -Blood cell counts and electrolytes are normal -Diabetes screening/A1c is normal  -Her vitamin D is improved from prior, but still low at 25.  I would encourage she increase her vitamin D to 3000-4000 units daily. -Cholesterol panel looks ok and LDL of 128.  Would recommend levels below 130 and closer to 100 for her. -Thyroid levels are above goal for her.  I have increased her thyroid supplement to 50 mcg daily.  New pill called in is 50 mg.  She has left over of the 25 mg she can take 2 daily so that she does not have to waste dose until new prescription is filled.  We will be retesting this on at her follow-up in September.  Lastly, her urinalysis does appear to be infectious.  I have called in an antibiotic called Bactrim to take every 12 hours for 5 days.

## 2021-07-09 LAB — URINALYSIS W MICROSCOPIC + REFLEX CULTURE
Bilirubin Urine: NEGATIVE
Glucose, UA: NEGATIVE
Hgb urine dipstick: NEGATIVE
Hyaline Cast: NONE SEEN /LPF
Ketones, ur: NEGATIVE
Nitrites, Initial: NEGATIVE
Protein, ur: NEGATIVE
Specific Gravity, Urine: 1.008 (ref 1.001–1.035)
pH: 7 (ref 5.0–8.0)

## 2021-07-09 LAB — CULTURE INDICATED

## 2021-07-09 LAB — URINE CULTURE
MICRO NUMBER:: 12211363
SPECIMEN QUALITY:: ADEQUATE

## 2021-07-09 LAB — HEPATITIS C ANTIBODY
Hepatitis C Ab: NONREACTIVE
SIGNAL TO CUT-OFF: 0.04 (ref <1.00)

## 2021-07-19 ENCOUNTER — Encounter: Payer: Managed Care, Other (non HMO) | Admitting: Family Medicine

## 2021-07-28 ENCOUNTER — Encounter: Payer: Self-pay | Admitting: Family Medicine

## 2021-08-03 ENCOUNTER — Other Ambulatory Visit: Payer: Self-pay | Admitting: Family Medicine

## 2021-08-14 ENCOUNTER — Encounter: Payer: Self-pay | Admitting: Family Medicine

## 2021-08-14 ENCOUNTER — Other Ambulatory Visit: Payer: Self-pay

## 2021-08-14 ENCOUNTER — Ambulatory Visit: Payer: Managed Care, Other (non HMO) | Admitting: Family Medicine

## 2021-08-14 VITALS — BP 129/79 | HR 97 | Temp 97.6°F | Wt >= 6400 oz

## 2021-08-14 DIAGNOSIS — E034 Atrophy of thyroid (acquired): Secondary | ICD-10-CM

## 2021-08-14 DIAGNOSIS — I10 Essential (primary) hypertension: Secondary | ICD-10-CM | POA: Insufficient documentation

## 2021-08-14 DIAGNOSIS — Z6841 Body Mass Index (BMI) 40.0 and over, adult: Secondary | ICD-10-CM

## 2021-08-14 DIAGNOSIS — N390 Urinary tract infection, site not specified: Secondary | ICD-10-CM | POA: Diagnosis not present

## 2021-08-14 LAB — POC URINALSYSI DIPSTICK (AUTOMATED)
Bilirubin, UA: NEGATIVE
Glucose, UA: NEGATIVE
Ketones, UA: NEGATIVE
Nitrite, UA: NEGATIVE
Protein, UA: POSITIVE — AB
Spec Grav, UA: 1.02 (ref 1.010–1.025)
Urobilinogen, UA: NEGATIVE E.U./dL — AB
pH, UA: 6.5 (ref 5.0–8.0)

## 2021-08-14 NOTE — Progress Notes (Signed)
This visit occurred during the SARS-CoV-2 public health emergency.  Safety protocols were in place, including screening questions prior to the visit, additional usage of staff PPE, and extensive cleaning of exam room while observing appropriate contact time as indicated for disinfecting solutions.    Patient ID: Teresa Osborne, female  DOB: 02-25-1976, 45 y.o.   MRN: [id number] Patient Care Team    Relationship Specialty Notifications Start End  Natalia Leatherwood, DO PCP - General Family Medicine  01/09/16   Malen Gauze, MD Consulting Physician Urology  12/12/19     Chief Complaint  Patient presents with   Hypertension   Follow-up     Subjective: Teresa Osborne is a 45 y.o.  Female  present for new onset hypertension follow-up and recurrent UTI follow-up. Elevated BP> new HTN/obesity: Pt reports compliance with lisinopril-HCTZ 10-12.5 mg daily.  She is tolerating medication well. Blood pressures ranges at home are not routinely checked. Patient denies chest pain, shortness of breath or lower extremity edema. RF: HTN, morbid obesity, HD fhx  Hypothyroid: Increase of levothyroxine from to 50 mcg last office visit.  Patient reports she is feeling well.  Retesting of labs needed today. Hematuria/recurrent UTI: Patient was found to have UTI last appointment.  She has a history of kidney stones.  She was treated with Bactrim for growth of both Klebsiella pneumonia and Citrobacter.  She reports all of her symptoms had resolved after use of antibiotic until yesterday.  She noticed a mild tinge of pain left lower back and left side.  We discussed today possible kidney stone causing infection is probable.  She would like to have her urine tested again today. Prior note: Pt reports she was seen in the UC for urinary frequency. She was told she did not have a UTI- they told her not a bacteris that causes uti was present, hematuria and protein. She is not certain what  that meant.    Depression screen [facility] 2/9 07/05/2021 07/12/2020 09/23/2019 09/15/2018 09/14/2017  Decreased Interest 0 0 0 0 0  Down, Depressed, Hopeless 0 0 0 1 0  PHQ - 2 Score 0 0 0 1 0  Altered sleeping - - - 0 -  Tired, decreased energy - - - 1 -  Change in appetite - - - 1 -  Feeling bad or failure about yourself  - - - 1 -  Trouble concentrating - - - 0 -  Moving slowly or fidgety/restless - - - 0 -  Suicidal thoughts - - - 0 -  PHQ-9 Score - - - 4 -  Difficult doing work/chores - - - Not difficult at all -   No flowsheet data found.   Immunization History  Administered Date(s) Administered   Tdap 12/01/2006, 09/14/2017    Past Medical History:  Diagnosis Date   Anxiety    History of kidney stones    Kidney stones    Staghorn   PONV (postoperative nausea and vomiting)    NOV 2018, JAN TREMORS LASTED ABOUT 15 MINUTES, LIKES SCOPOLOMINE PATCH; no issues wuth last kidney stone surgery    Pre-diabetes    Vaginal Pap smear, abnormal    Allergies  Allergen Reactions   Bactrim [Sulfamethoxazole-Trimethoprim] Rash   Past Surgical History:  Procedure Laterality Date   CYSTOSCOPY  10/27/2017   Procedure: CYSTOSCOPY FLEXIBLE;  Surgeon: Malen Gauze, MD;  [facility];  Service: Urology;;   CYSTOSCOPY WITH RETROGRADE PYELOGRAM, URETEROSCOPY AND STENT PLACEMENT Left 01/21/2018  Procedure: CYSTOSCOPY WITH RETROGRADE PYELOGRAM, URETEROSCOPY AND STENT PLACEMENT;  Surgeon: Malen Gauze, MD;  [facility];  Service: Urology;  Laterality: Left;   HOLMIUM LASER APPLICATION Left 01/21/2018   Procedure: HOLMIUM LASER APPLICATION;  Surgeon: Malen Gauze, MD;  [facility];  Service: Urology;  Laterality: Left;   IR URETERAL STENT LEFT NEW ACCESS W/O SEP NEPHROSTOMY CATH  02/18/2018   NEPHROLITHOTOMY Left 12/07/2017   Procedure: LEFT NEPHROLITHOTOMY PERCUTANEOUS AND STENT PLACEMENT;  Surgeon: Malen Gauze, MD;  [facility];  Service: Urology;  Laterality: Left;    NEPHROLITHOTOMY Left 02/19/2018   Procedure: NEPHROLITHOTOMY PERCUTANEOUS;  Surgeon: Malen Gauze, MD;  [facility];  Service: Urology;  Laterality: Left;   NEPHROLITHOTOMY Left 02/25/2018   Procedure: NEPHROLITHOTOMY PERCUTANEOUS SECOND LOOK;  Surgeon: Malen Gauze, MD;  [facility];  Service: Urology;  Laterality: Left;   NEPHROSTOMY Left 10/27/2017   Procedure: NEPHROSTOMY TUBE PLACEMENT;  Surgeon: Malen Gauze, MD;  [facility];  Service: Urology;  Laterality: Left;   Family History  Problem Relation Age of Onset   Cancer Paternal Grandfather        pancreatic   Alcohol abuse Paternal Grandfather    Arthritis Paternal Grandfather    COPD Paternal Grandfather    Early death Paternal Grandfather    Cancer Paternal Grandmother        unknown, metz   Arthritis Paternal Grandmother    COPD Paternal Grandmother    Early death Paternal Grandmother    Arthritis Father    Mental retardation Father    Arthritis Maternal Grandmother    COPD Maternal Grandfather    Heart disease Maternal Grandfather    Cancer Sister 35       ovarian   Social History   Social History Narrative   Married to [place]. 2 children [name] in [place].   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   Drink caffeinated beverages, wears her seatbelt, wears a bike helmet.   Smoke detector in the home   Feels safe in her relationships    Allergies as of 08/14/2021       Reactions   Bactrim [sulfamethoxazole-trimethoprim] Rash        Medication List        Accurate as of August 14, 2021  5:20 PM. If you have Teresa questions, ask your nurse or doctor.          STOP taking these medications    sulfamethoxazole-trimethoprim 800-160 MG tablet Commonly known as: BACTRIM DS Stopped by: Felix Pacini, DO       TAKE these medications    levonorgestrel 20 MCG/24HR IUD Commonly known as: MIRENA 1 each once by Intrauterine route.   levothyroxine  50 MCG tablet Commonly known as: SYNTHROID Take 1 tablet (50 mcg total) by mouth daily.   lisinopril-hydrochlorothiazide 10-12.5 MG tablet Commonly known as: ZESTORETIC Take 1 tablet by mouth daily.   Vitamin D 50 MCG (2000 UT) Caps Take 1 capsule by mouth daily.        All past medical history, surgical history, allergies, family history, immunizations andmedications were updated in the EMR today and reviewed under the history and medication portions of their EMR.       ROS: 14 pt review of systems performed and negative (unless mentioned in an HPI)  Objective: BP 129/79   Pulse 97   Temp 97.6 F (36.4 C)   Wt (!) 457 lb 9.6 oz (207.6 kg)  SpO2 97%   BMI 74.99 kg/m  Gen: Afebrile. No acute distress.  HENT: AT. [place].  Eyes:Pupils Equal Round Reactive to light, Extraocular movements intact,  Conjunctiva without redness, discharge or icterus. Neck/lymp/endocrine: Supple, no lymphadenopathy, no thyromegaly CV: RRR no murmur, no edema Chest: CTAB, no wheeze or crackles Skin: no rashes, purpura or petechiae.  Neuro: Normal gait. PERLA. EOMi. Alert. Oriented Psych: Normal affect, dress and demeanor. Normal speech. Normal thought content and judgment..   No results found.  Assessment/plan: Teresa Osborne is a 45 y.o. female present for  New HTN/Morbid obesity with BMI of 70 and over, adult [facility]) -Much improved on medication.  Now at goal - Continue lisinopril-hctz 10-12.5 mg qd - increase exercise - low sodium diet.  Follow-up 5.5 months.  Hypothyroidism, unspecified type Continue levo 50 mcg qd> refills will be provided in appropriate dose after results received. TSH, T3 free  Recurrent UTI/hematuria: Point-of-care urinalysis completed today with leukocytes Urine sent for microanalysis and culture.  We will treat if appropriate after results received.   No follow-ups on file.   Orders Placed This Encounter  Procedures   T3, free   TSH   Urinalysis w  microscopic + reflex cultur   POCT Urinalysis Dipstick (Automated)    No orders of the defined types were placed in this encounter.  Referral Orders  No referral(s) requested today     Electronically signed by: Felix Pacini, DO [facility]

## 2021-08-14 NOTE — Patient Instructions (Signed)
  We will call you with all results.   BP looks great.

## 2021-08-15 ENCOUNTER — Telehealth: Payer: Self-pay | Admitting: Family Medicine

## 2021-08-15 LAB — TSH: TSH: 2.15 u[IU]/mL (ref 0.450–4.500)

## 2021-08-15 LAB — T3, FREE: T3, Free: 3 pg/mL (ref 2.0–4.4)

## 2021-08-15 MED ORDER — TAMSULOSIN HCL 0.4 MG PO CAPS: 0.4000 mg | ORAL_CAPSULE | Freq: Every day | ORAL | 3 refills | Status: DC

## 2021-08-15 MED ORDER — LEVOTHYROXINE SODIUM 50 MCG PO TABS: 50.0000 ug | ORAL_TABLET | Freq: Every day | ORAL | 3 refills | Status: DC

## 2021-08-15 NOTE — Telephone Encounter (Signed)
Please inform patient her thyroid panel looks great.  I have refilled her thyroid medicine at the levothyroxine 50 mcg dose for 1 year. Her urinalysis did result with bacteria and high white blood cell count as well as a small amount of blood.  I am going to wait on the culture results before initiating treatment since her symptom was only mild, this could be a kidney stone versus recurrent infection. Since she had mild symptoms that just restarted that could reflect a recurrent kidney stone for her, I did go ahead and call in Flomax for her to start and take over the next few days to help with expelling potential kidney stone if her pain is continuing. I would also encourage her to increase her water intake flush out her kidneys well and use anti-inflammatories to help with discomfort.   We will call her with urine culture once resulted and treat appropriately if she has recurrent UTI as well.

## 2021-08-15 NOTE — Telephone Encounter (Signed)
Spoke with pt regarding labs and instructions.   

## 2021-08-16 ENCOUNTER — Telehealth: Payer: Self-pay | Admitting: Family Medicine

## 2021-08-16 MED ORDER — CEFDINIR 300 MG PO CAPS: 300.0000 mg | ORAL_CAPSULE | Freq: Two times a day (BID) | ORAL | 0 refills | Status: DC

## 2021-08-16 NOTE — Telephone Encounter (Signed)
Spoke with patient regarding results/recommendations,voiced understanding.  

## 2021-08-16 NOTE — Telephone Encounter (Signed)
LVM for pt to CB regarding results.  

## 2021-08-16 NOTE — Telephone Encounter (Signed)
Please inform patient the following information: Her urine cx is positive for E. Coli UTI- this is a different cause than the 2 bacteria from last time.  I have called in an antibiotic called Omnicef for her to take once every 12 hours for 7 days.

## 2021-08-17 LAB — URINALYSIS W MICROSCOPIC + REFLEX CULTURE
Bilirubin Urine: NEGATIVE
Glucose, UA: NEGATIVE
Ketones, ur: NEGATIVE
Nitrites, Initial: NEGATIVE
Specific Gravity, Urine: 1.015 (ref 1.001–1.035)
pH: 6.5 (ref 5.0–8.0)

## 2021-08-17 LAB — URINE CULTURE
MICRO NUMBER:: 12376676
SPECIMEN QUALITY:: ADEQUATE

## 2021-08-17 LAB — CULTURE INDICATED

## 2021-08-20 ENCOUNTER — Telehealth: Payer: Self-pay | Admitting: Family Medicine

## 2021-08-20 NOTE — Telephone Encounter (Signed)
The medication called in Friday was flomax. That is not an abx. It is to help with kidney stones. If she feels her pain is subsided or she knows she passed a stone over the weekend she can dc flomax. If she is still having discomfort she can continue flomax until stone resolves or 4 weeks. If still having pain at 4 weeks will need to see her urologist.

## 2021-08-20 NOTE — Telephone Encounter (Signed)
-----   Message from Maxie Barb, [place] sent at 08/20/2021  9:13 AM EDT ----- Spoke with pt regarding results and instructions. Pt would like to know if she could continue abx based from call on Friday.

## 2021-08-20 NOTE — Telephone Encounter (Signed)
Spoke with pt and inform to d/c abx due to re-culture showing no infection per Dr. Claiborne Billings. Pt verbalized understanding.

## 2021-09-18 ENCOUNTER — Ambulatory Visit: Payer: Managed Care, Other (non HMO)

## 2021-09-23 ENCOUNTER — Other Ambulatory Visit: Payer: Self-pay

## 2021-09-23 ENCOUNTER — Encounter: Payer: Self-pay | Admitting: Women's Health

## 2021-09-23 ENCOUNTER — Ambulatory Visit (INDEPENDENT_AMBULATORY_CARE_PROVIDER_SITE_OTHER): Payer: Managed Care, Other (non HMO) | Admitting: Women's Health

## 2021-09-23 ENCOUNTER — Other Ambulatory Visit (HOSPITAL_COMMUNITY)
Admission: RE | Admit: 2021-09-23 | Discharge: 2021-09-23 | Disposition: A | Discharge status: Home / Self Care | Payer: Managed Care, Other (non HMO) | Source: Ambulatory Visit | Attending: Women's Health | Admitting: Women's Health

## 2021-09-23 VITALS — BP 133/82 | HR 90 | Ht 66.0 in | Wt >= 6400 oz

## 2021-09-23 DIAGNOSIS — R102 Pelvic and perineal pain unspecified side: Secondary | ICD-10-CM

## 2021-09-23 DIAGNOSIS — Z01419 Encounter for gynecological examination (general) (routine) without abnormal findings: Secondary | ICD-10-CM

## 2021-09-23 DIAGNOSIS — N941 Unspecified dyspareunia: Secondary | ICD-10-CM | POA: Diagnosis not present

## 2021-09-23 NOTE — Progress Notes (Signed)
WELL-WOMAN EXAMINATION-Breast & Pap Only Patient name: Teresa Osborne MRN [id number]  Date of birth: 12-01-1976 Chief Complaint:   Gynecologic Exam (+ cramps)  History of Present Illness:   Teresa Osborne is a 45 y.o. G40P2002 Caucasian female being seen today for a routine well-woman exam-breast & pap only.  Full physical done w/ PCP in Aug. Current complaints: pelvic cramping, occ pain w/ sex. Bleeds only about q4mths on Liletta IUD. Some vaginal odor, no itching/irritation unless husband uses lubrication  PCP: Claiborne Billings      does not desire labs, does w/ PCP No LMP recorded. (Menstrual status: IUD). The current method of family planning is IUD Liletta inserted 05/21/18 Last pap 06/18/18. Results were: NILM w/ HRHPV positive: type not specified. H/O abnormal pap: no Last mammogram: never, has one scheduled for 11/14. Results were: N/A. Family h/o breast cancer: no Last colonoscopy: never, has Cologuard at home. Results were: N/A. Family h/o colorectal cancer: no  Depression screen [facility] 2/9 09/23/2021 07/05/2021 07/12/2020 09/23/2019 09/15/2018  Decreased Interest 0 0 0 0 0  Down, Depressed, Hopeless 0 0 0 0 1  PHQ - 2 Score 0 0 0 0 1  Altered sleeping 0 - - - 0  Tired, decreased energy 1 - - - 1  Change in appetite 1 - - - 1  Feeling bad or failure about yourself  1 - - - 1  Trouble concentrating 0 - - - 0  Moving slowly or fidgety/restless 0 - - - 0  Suicidal thoughts 0 - - - 0  PHQ-9 Score 3 - - - 4  Difficult doing work/chores - - - - Not difficult at all     GAD 7 : Generalized Anxiety Score 09/23/2021  Nervous, Anxious, on Edge 1  Control/stop worrying 0  Worry too much - different things 0  Trouble relaxing 0  Restless 0  Easily annoyed or irritable 1  Afraid - awful might happen 0  Total GAD 7 Score 2     Review of Systems:   Pertinent items are noted in HPI Denies any headaches, blurred vision, fatigue, shortness of breath, chest pain, abdominal pain, abnormal  vaginal discharge/itching/odor/irritation, problems with periods, bowel movements, urination, or intercourse unless otherwise stated above. Pertinent History Reviewed:  Reviewed past medical,surgical, social and family history.  Reviewed problem list, medications and allergies. Physical Assessment:   Vitals:   09/23/21 0919  BP: 133/82  Pulse: 90  Weight: (!) 455 lb (206.4 kg)  Height: 5\' 6"  (1.676 m)  Body mass index is 73.44 kg/m.        Physical Examination:   General appearance - well appearing, and in no distress  Mental status - alert, oriented to person, place, and time  Psych:  She has a normal mood and affect  Skin - warm and dry, normal color  Chest - effort normal  Heart - normal rate   Breasts - breasts appear normal, no suspicious masses, no skin or nipple changes or axillary nodes  Pelvic - VULVA: normal appearing vulva with no masses, tenderness or lesions  VAGINA: normal appearing vagina with normal color and discharge, no lesions  CERVIX: normal appearing cervix without discharge or lesions, no CMT, IUD strings visible  Thin prep pap is done w/ HR HPV cotesting  UTERUS: unable to palpate d/t body habitus, no tenderness   ADNEXA: unable to palpate d/t body habitus, no tenderness  Chaperone:    No results found for this or any previous visit (  from the past 24 hour(s)).  Assessment & Plan:  1) Well-Woman Exam- breast & pap smear only  2) Pelvic cramping & dyspareunia> will check gc/ct/vaginitis on pap, if neg discussed pelvic u/s- pt wants to do  Labs/procedures today: pap  Mammogram:  11/14 as scheduled Colonoscopy:  do Cologuard she already has at home  No orders of the defined types were placed in this encounter.   Meds: No orders of the defined types were placed in this encounter.   Follow-up: Return in about 1 year (around 09/23/2022) for Physical.  Cheral Marker CNM, [facility] 09/23/2021 9:56 AM

## 2021-09-24 LAB — CYTOLOGY - PAP
Chlamydia: NEGATIVE
Comment: NEGATIVE
Comment: NEGATIVE
Comment: NORMAL
Diagnosis: NEGATIVE
High risk HPV: NEGATIVE
Neisseria Gonorrhea: NEGATIVE

## 2021-09-25 ENCOUNTER — Other Ambulatory Visit: Payer: Self-pay | Admitting: Women's Health

## 2021-09-25 MED ORDER — FLUCONAZOLE 150 MG PO TABS: 150.0000 mg | ORAL_TABLET | Freq: Once | ORAL | 0 refills | Status: AC

## 2021-10-14 ENCOUNTER — Ambulatory Visit: Payer: Managed Care, Other (non HMO)

## 2021-12-22 ENCOUNTER — Other Ambulatory Visit: Payer: Self-pay | Admitting: Family Medicine

## 2022-01-28 ENCOUNTER — Other Ambulatory Visit: Payer: Self-pay | Admitting: Family Medicine

## 2022-02-21 DIAGNOSIS — Z0289 Encounter for other administrative examinations: Secondary | ICD-10-CM

## 2022-03-05 ENCOUNTER — Ambulatory Visit (INDEPENDENT_AMBULATORY_CARE_PROVIDER_SITE_OTHER): Payer: Managed Care, Other (non HMO) | Admitting: Family Medicine

## 2022-03-05 DIAGNOSIS — R0602 Shortness of breath: Secondary | ICD-10-CM

## 2022-03-05 DIAGNOSIS — R5383 Other fatigue: Secondary | ICD-10-CM

## 2022-03-07 ENCOUNTER — Encounter (INDEPENDENT_AMBULATORY_CARE_PROVIDER_SITE_OTHER): Payer: Self-pay | Admitting: Family Medicine

## 2022-03-07 ENCOUNTER — Encounter: Payer: Self-pay | Admitting: Family Medicine

## 2022-03-11 MED ORDER — LISINOPRIL-HYDROCHLOROTHIAZIDE 10-12.5 MG PO TABS: 1.0000 | ORAL_TABLET | Freq: Every day | ORAL | 0 refills | Status: DC

## 2022-03-12 ENCOUNTER — Ambulatory Visit (INDEPENDENT_AMBULATORY_CARE_PROVIDER_SITE_OTHER): Payer: Managed Care, Other (non HMO) | Admitting: Family Medicine

## 2022-03-12 ENCOUNTER — Encounter (INDEPENDENT_AMBULATORY_CARE_PROVIDER_SITE_OTHER): Payer: Self-pay | Admitting: Family Medicine

## 2022-03-12 VITALS — BP 146/94 | HR 73 | Temp 98.0°F | Ht 65.0 in | Wt >= 6400 oz

## 2022-03-12 DIAGNOSIS — R5383 Other fatigue: Secondary | ICD-10-CM

## 2022-03-12 DIAGNOSIS — R0602 Shortness of breath: Secondary | ICD-10-CM | POA: Diagnosis not present

## 2022-03-12 DIAGNOSIS — E668 Other obesity: Secondary | ICD-10-CM

## 2022-03-12 DIAGNOSIS — Z87442 Personal history of urinary calculi: Secondary | ICD-10-CM

## 2022-03-12 DIAGNOSIS — Z6841 Body Mass Index (BMI) 40.0 and over, adult: Secondary | ICD-10-CM

## 2022-03-12 DIAGNOSIS — I10 Essential (primary) hypertension: Secondary | ICD-10-CM

## 2022-03-12 DIAGNOSIS — E559 Vitamin D deficiency, unspecified: Secondary | ICD-10-CM

## 2022-03-12 DIAGNOSIS — Z1331 Encounter for screening for depression: Secondary | ICD-10-CM

## 2022-03-12 DIAGNOSIS — E034 Atrophy of thyroid (acquired): Secondary | ICD-10-CM | POA: Diagnosis not present

## 2022-03-12 DIAGNOSIS — Z9189 Other specified personal risk factors, not elsewhere classified: Secondary | ICD-10-CM

## 2022-03-12 DIAGNOSIS — E7849 Other hyperlipidemia: Secondary | ICD-10-CM

## 2022-03-12 DIAGNOSIS — R7303 Prediabetes: Secondary | ICD-10-CM

## 2022-03-13 ENCOUNTER — Encounter (INDEPENDENT_AMBULATORY_CARE_PROVIDER_SITE_OTHER): Payer: Self-pay | Admitting: Family Medicine

## 2022-03-13 LAB — LIPID PANEL WITH LDL/HDL RATIO
Cholesterol, Total: 202 mg/dL — ABNORMAL HIGH (ref 100–199)
HDL: 54 mg/dL (ref >39)
LDL Chol Calc (NIH): 135 mg/dL — ABNORMAL HIGH (ref 0–99)
LDL/HDL Ratio: 2.5 ratio (ref 0.0–3.2)
Triglycerides: 73 mg/dL (ref 0–149)
VLDL Cholesterol Cal: 13 mg/dL (ref 5–40)

## 2022-03-13 LAB — CBC WITH DIFFERENTIAL/PLATELET
Basophils Absolute: 0.1 10*3/uL (ref 0.0–0.2)
Basos: 1 %
EOS (ABSOLUTE): 0.3 10*3/uL (ref 0.0–0.4)
Eos: 5 %
Hematocrit: 41.1 % (ref 34.0–46.6)
Hemoglobin: 13.8 g/dL (ref 11.1–15.9)
Immature Grans (Abs): 0 10*3/uL (ref 0.0–0.1)
Immature Granulocytes: 0 %
Lymphocytes Absolute: 1.6 10*3/uL (ref 0.7–3.1)
Lymphs: 25 %
MCH: 30.7 pg (ref 26.6–33.0)
MCHC: 33.6 g/dL (ref 31.5–35.7)
MCV: 91 fL (ref 79–97)
Monocytes Absolute: 0.3 10*3/uL (ref 0.1–0.9)
Monocytes: 5 %
Neutrophils Absolute: 4 10*3/uL (ref 1.4–7.0)
Neutrophils: 64 %
Platelets: 353 10*3/uL (ref 150–450)
RBC: 4.5 x10E6/uL (ref 3.77–5.28)
RDW: 12.8 % (ref 11.7–15.4)
WBC: 6.3 10*3/uL (ref 3.4–10.8)

## 2022-03-13 LAB — INSULIN, RANDOM: INSULIN: 44.5 u[IU]/mL — ABNORMAL HIGH (ref 2.6–24.9)

## 2022-03-13 LAB — COMPREHENSIVE METABOLIC PANEL
ALT: 11 IU/L (ref 0–32)
AST: 12 IU/L (ref 0–40)
Albumin/Globulin Ratio: 1.1 — ABNORMAL LOW (ref 1.2–2.2)
Albumin: 3.7 g/dL — ABNORMAL LOW (ref 3.8–4.8)
Alkaline Phosphatase: 76 IU/L (ref 44–121)
BUN/Creatinine Ratio: 13 (ref 9–23)
BUN: 13 mg/dL (ref 6–24)
Bilirubin Total: 0.3 mg/dL (ref 0.0–1.2)
CO2: 21 mmol/L (ref 20–29)
Calcium: 9 mg/dL (ref 8.7–10.2)
Chloride: 106 mmol/L (ref 96–106)
Creatinine, Ser: 0.99 mg/dL (ref 0.57–1.00)
Globulin, Total: 3.5 g/dL (ref 1.5–4.5)
Glucose: 101 mg/dL — ABNORMAL HIGH (ref 70–99)
Potassium: 5.1 mmol/L (ref 3.5–5.2)
Sodium: 140 mmol/L (ref 134–144)
Total Protein: 7.2 g/dL (ref 6.0–8.5)
eGFR: 71 mL/min/{1.73_m2} (ref >59)

## 2022-03-13 LAB — TSH: TSH: 3.79 u[IU]/mL (ref 0.450–4.500)

## 2022-03-13 LAB — VITAMIN B12: Vitamin B-12: 219 pg/mL — ABNORMAL LOW (ref 232–1245)

## 2022-03-13 LAB — T3: T3, Total: 122 ng/dL (ref 71–180)

## 2022-03-13 LAB — FOLATE: Folate: 5.5 ng/mL (ref >3.0)

## 2022-03-13 LAB — T4, FREE: Free T4: 1.22 ng/dL (ref 0.82–1.77)

## 2022-03-13 LAB — HEMOGLOBIN A1C
Est. average glucose Bld gHb Est-mCnc: 117 mg/dL
Hgb A1c MFr Bld: 5.7 % — ABNORMAL HIGH (ref 4.8–5.6)

## 2022-03-13 LAB — VITAMIN D 25 HYDROXY (VIT D DEFICIENCY, FRACTURES): Vit D, 25-Hydroxy: 19.3 ng/mL — ABNORMAL LOW (ref 30.0–100.0)

## 2022-03-13 NOTE — Telephone Encounter (Signed)
Please advise 

## 2022-03-17 NOTE — Progress Notes (Signed)
? ? ? ?Chief Complaint:  ? ?OBESITY ?Teresa Osborne (MR# [id number]) is a 46 y.o. female who presents for evaluation and treatment of obesity and related comorbidities. Current BMI is Body mass index is 79.88 kg/m?Teresa Osborne has been struggling with her weight for many years and has been unsuccessful in either losing weight, maintaining weight loss, or reaching her healthy weight goal. ? ?[name] looked up the clinic on the Internet search. She works from home 7 am - 7 pm. She previously did [place] and [employer]. She skips breakfast and lunch 3-4 times per week. She may eat a bowl of cereal (2 cups) Special K with vanilla almond milk or Lance. Peanut butter crackers (3 crackers). 1:15 pm sandwich or bologna (1 slice) or banana or [place] (2 slices) +/- cheese and chips 1 cup (satisfied). She mat eat another pack of crackers. Supper is hot dogs or chicken (4 ounces)or spaghetti with regular (1/2 cup) (full).  ? ?Teresa Osborne is currently in the action stage of change and ready to dedicate time achieving and maintaining a healthier weight. Teresa Osborne is interested in becoming our patient and working on intensive lifestyle modifications including (but not limited to) diet and exercise for weight loss. ? ?Teresa Osborne's habits were reviewed today and are as follows: Her family eats meals together, she thinks her family will eat healthier with her, her desired weight loss is 220 pounds, she has been heavy most of her life, she started gaining weight after highschool, her heaviest weight ever was 490 pounds, she has significant food cravings issues, she snacks frequently in the evenings, she skips meals frequently, she is frequently drinking liquids with calories, she frequently makes poor food choices, she frequently eats larger portions than normal, and she struggles with emotional eating. ? ?Depression Screen ?Teresa Osborne's Food and Mood (modified PHQ-9) score was 16. ? ? ?  03/12/2022  ?  8:31 AM  ?Depression screen PHQ 2/9   ?Decreased Interest 3  ?Down, Depressed, Hopeless 2  ?PHQ - 2 Score 5  ?Altered sleeping 1  ?Tired, decreased energy 3  ?Change in appetite 1  ?Feeling bad or failure about yourself  3  ?Trouble concentrating 0  ?Moving slowly or fidgety/restless 3  ?Suicidal thoughts 0  ?PHQ-9 Score 16  ?Difficult doing work/chores Very difficult  ? ?Subjective:  ? ?1. Other fatigue ?Joury admits to daytime somnolence and admits to waking up still tired. Patient has a history of symptoms of morning fatigue. Teresa Osborne generally gets 6 or 7 hours of sleep per night, and states that she has difficulty falling asleep and generally restful sleep. Snoring is present. Apneic episodes are not present. Epworth Sleepiness Score is 4.   ? ?2. SOBOE (shortness of breath on exertion) ?Teresa Osborne notes increasing shortness of breath with exercising and seems to be worsening over time with weight gain. She notes getting out of breath sooner with activity than she used to. This has not gotten worse recently. Teresa Osborne denies shortness of breath at rest or orthopnea.  ? ?3. Essential hypertension ?Teresa Osborne was diagnosed just last year.  She is currently taking Lisinopril -HCTZ 10-12.5 mg daily. We reviewed  EKG it showed normal sinus rhythm.  ? ?4. Hypothyroidism due to acquired atrophy of thyroid ?[name]'s last TSH was within normal limits. She is currently on levothyroxine 50 mcg.  ? ?5. Other hyperlipidemia ?Teresa Osborne's last LDL was 12.5. Her HDL 61.9. Her Triglycerides was 74. She is not on medications currently.  ? ?6. Vitamin D deficiency ?Teresa Osborne's Vitamin D level  was of 25.2. ? ?7. Prediabetes ?Teresa Osborne's A1C was initially diagnosed 6 years ago. Her most recent A1C was last year 5.5. ? ?8. History of nephrolithiasis ?Teresa Osborne has a history of nephrolithiasis and required surgery in the past. She sees Dr. Ronne BinningMcKenzie but hasn't seen him since 2019. ? ?9. At risk for deficient intake of food ?Teresa Osborne is at risk for deficient intake of food due to  current diary choices.  ? ?Assessment/Plan:  ? ?1. Other fatigue ?Teresa Osborne does feel that her weight is causing her energy to be lower than it should be. Fatigue may be related to obesity, depression or many other causes. Labs will be ordered, and in the meanwhile, Teresa Osborne will focus on self care including making healthy food choices, increasing physical activity and focusing on stress reduction. We discussed  EKG today and it showed normal sinus rhythm at 73 bpm. ? ?- EKG 12-Lead ?- Vitamin B12 ?- Folate ?- TSH ?- T4, free ?- T3 ? ?2. SOBOE (shortness of breath on exertion) ?Teresa Osborne does feel that she gets out of breath more easily that she used to when she exercises. [name]'s shortness of breath appears to be obesity related and exercise induced. She has agreed to work on weight loss and gradually increase exercise to treat her exercise induced shortness of breath. Will continue to monitor closely. We reviewed EKG, IC, and labs today.  ? ?- CBC with Differential/Platelet ? ?3. Essential hypertension ?Teresa Osborne is working on healthy weight loss and exercise to improve blood pressure control. We will watch for signs of hypotension as she continues her lifestyle modifications. We will check CMP and EKG today. ?  ?- Lipid Panel With LDL/HDL Ratio ?- Comprehensive metabolic panel ? ?4. Hypothyroidism due to acquired atrophy of thyroid ?We will check thyroid panel today. Orders and follow up as documented in patient record. ? ?Counseling ?Good thyroid control is important for overall health. Supratherapeutic thyroid levels are dangerous and will not improve weight loss results. ?Counseling: The correct way to take levothyroxine is fasting, with water, separated by at least 30 minutes from breakfast, and separated by more than 4 hours from calcium, iron, multivitamins, acid reflux medications (PPIs).   ? ?5. Other hyperlipidemia ?Cardiovascular risk and specific lipid/LDL goals reviewed.  We will check FLP today. We  discussed several lifestyle modifications today and Teresa Osborne will continue to work on diet, exercise and weight loss efforts. Orders and follow up as documented in patient record.  ? ?Counseling ?Intensive lifestyle modifications are the first line treatment for this issue. ?Dietary changes: Increase soluble fiber. Decrease simple carbohydrates. ?Exercise changes: Moderate to vigorous-intensity aerobic activity 150 minutes per week if tolerated. ?Lipid-lowering medications: see documented in medical record. ? ?6. Vitamin D deficiency ?Low Vitamin D level contributes to fatigue and are associated with obesity, breast, and colon cancer. We will check Vitamin D level today. Teresa Osborne agrees to continue to take prescription Vitamin D 50,000 IU every week and she will follow-up for routine testing of Vitamin D, at least 2-3 times per year to avoid over-replacement. ? ?- VITAMIN D 25 Hydroxy (Vit-D Deficiency, Fractures) ? ?7. Prediabetes ?Teresa Osborne will continue to work on weight loss, exercise, and decreasing simple carbohydrates to help decrease the risk of diabetes. We will check A1C and insulin today.  ? ?- Hemoglobin A1c ?- Insulin, random ? ?8. History of nephrolithiasis ?We will check CMP today.  ? ?- Comprehensive metabolic panel ? ?9. Depression screening ?Teresa Osborne had a positive depression screening. Depression is commonly associated with  obesity and often results in emotional eating behaviors. We will monitor this closely and work on CBT to help improve the non-hunger eating patterns. Referral to Psychology may be required if no improvement is seen as she continues in our clinic.  ? ?10. At risk for deficient intake of food ?Demiyah was given approximately 15 minutes of deficient intake of food prevention counseling today. Isaac is at risk for eating too few calories based on current food recall. She was encouraged to focus on meeting caloric and protein goals according to her recommended meal plan.  ? ?11. 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

## 2022-03-19 ENCOUNTER — Ambulatory Visit (INDEPENDENT_AMBULATORY_CARE_PROVIDER_SITE_OTHER): Payer: Managed Care, Other (non HMO) | Admitting: Family Medicine

## 2022-03-20 ENCOUNTER — Other Ambulatory Visit: Payer: Self-pay | Admitting: Family Medicine

## 2022-03-25 ENCOUNTER — Ambulatory Visit: Payer: Managed Care, Other (non HMO) | Admitting: Family Medicine

## 2022-03-25 ENCOUNTER — Encounter: Payer: Self-pay | Admitting: Family Medicine

## 2022-03-25 VITALS — BP 112/77 | HR 105 | Temp 98.1°F | Ht 65.0 in | Wt >= 6400 oz

## 2022-03-25 DIAGNOSIS — Z6841 Body Mass Index (BMI) 40.0 and over, adult: Secondary | ICD-10-CM

## 2022-03-25 DIAGNOSIS — E034 Atrophy of thyroid (acquired): Secondary | ICD-10-CM

## 2022-03-25 DIAGNOSIS — I10 Essential (primary) hypertension: Secondary | ICD-10-CM

## 2022-03-25 MED ORDER — LEVOTHYROXINE SODIUM 50 MCG PO TABS: 50.0000 ug | ORAL_TABLET | Freq: Every day | ORAL | 3 refills | Status: DC

## 2022-03-25 MED ORDER — LISINOPRIL-HYDROCHLOROTHIAZIDE 10-12.5 MG PO TABS: 1.0000 | ORAL_TABLET | Freq: Every day | ORAL | 1 refills | Status: DC

## 2022-03-25 NOTE — Patient Instructions (Signed)
? ?  Great to see you today.  ?I have refilled the medication(s) we provide.  ? ?If labs were collected, we will inform you of lab results once received either by echart message or telephone call.  ? - echart message- for normal results that have been seen by the patient already.  ? - telephone call: abnormal results or if patient has not viewed results in their echart. ? ?Return in about 24 weeks (around 09/09/2022) for Routine chronic condition follow-up. ? ?

## 2022-03-25 NOTE — Progress Notes (Signed)
? ?This visit occurred during the SARS-CoV-2 public health emergency.  Safety protocols were in place, including screening questions prior to the visit, additional usage of staff PPE, and extensive cleaning of exam room while observing appropriate contact time as indicated for disinfecting solutions.  ? ? ?Patient ID: Teresa Osborne, female  DOB: 05-04-1976, 46 y.o.   MRN: [id number] ?Patient Care Team  ?  Relationship Specialty Notifications Start End  ?Natalia Leatherwood, DO PCP - General Family Medicine  01/09/16   ?McKenzie, Mardene Celeste, MD Consulting Physician Urology  12/12/19   ? ? ?Chief Complaint  ?Patient presents with  ? Hypertension  ?  Cmc; pt is not fasting  ? ? ? ?Subjective: ?Teresa Osborne is a 46 y.o.  Female  present for new onset hypertension follow-up and recurrent UTI follow-up. ?Elevated BP> new HTN/obesity: ?Pt reports compliance with lisinopril-HCTZ 10-12.5 mg daily.  She is tolerating medication well. Blood pressures ranges at home are not routinely checked. Patient denies chest pain, shortness of breath, dizziness or lower extremity edema.  ?RF: HTN, morbid obesity, HD fhx ? ?Hypothyroid: ?Pt reports compliance of levothyroxine 50 mcg last office visit.  Patient reports she is feeling well.   ?  ? ?  03/12/2022  ?  8:31 AM 09/23/2021  ?  9:16 AM 07/05/2021  ?  8:00 AM 07/12/2020  ? 10:02 AM 09/23/2019  ?  9:34 AM  ?Depression screen PHQ 2/9  ?Decreased Interest 3 0 0 0 0  ?Down, Depressed, Hopeless 2 0 0 0 0  ?PHQ - 2 Score 5 0 0 0 0  ?Altered sleeping 1 0     ?Tired, decreased energy 3 1     ?Change in appetite 1 1     ?Feeling bad or failure about yourself  3 1     ?Trouble concentrating 0 0     ?Moving slowly or fidgety/restless 3 0     ?Suicidal thoughts 0 0     ?PHQ-9 Score 16 3     ?Difficult doing work/chores Very difficult      ? ? ?  09/23/2021  ?  9:17 AM  ?GAD 7 : Generalized Anxiety Score  ?Nervous, Anxious, on Edge 1  ?Control/stop worrying 0  ?Worry too much - different things 0   ?Trouble relaxing 0  ?Restless 0  ?Easily annoyed or irritable 1  ?Afraid - awful might happen 0  ?Total GAD 7 Score 2  ? ? ? ?Immunization History  ?Administered Date(s) Administered  ? Tdap 12/01/2006, 09/14/2017  ? ? ?Past Medical History:  ?Diagnosis Date  ? Anxiety   ? Bilateral swelling of feet   ? History of kidney stones   ? Hypertension   ? Hypothyroid   ? Joint pain   ? Kidney problem   ? Kidney stones   ? [place]  ? PONV (postoperative nausea and vomiting)   ? NOV 2018, JAN TREMORS LASTED ABOUT 15 MINUTES, LIKES SCOPOLOMINE PATCH; no issues wuth last kidney stone surgery   ? Pre-diabetes   ? Prediabetes   ? SOB (shortness of breath)   ? Thyroid disease   ? Vaginal Pap smear, abnormal   ? Vitamin D deficiency   ? ?Allergies  ?Allergen Reactions  ? Bactrim [Sulfamethoxazole-Trimethoprim] Rash  ? ?Past Surgical History:  ?Procedure Laterality Date  ? CYSTOSCOPY  10/27/2017  ? Procedure: CYSTOSCOPY FLEXIBLE;  Surgeon: Malen Gauze, MD;  Location: [facility];  Service: Urology;;  ? CYSTOSCOPY WITH RETROGRADE PYELOGRAM, URETEROSCOPY AND  STENT PLACEMENT Left 01/21/2018  ? Procedure: CYSTOSCOPY WITH RETROGRADE PYELOGRAM, URETEROSCOPY AND STENT PLACEMENT;  Surgeon: Malen Gauze, MD;  Location: [facility];  Service: Urology;  Laterality: Left;  ? HOLMIUM LASER APPLICATION Left 01/21/2018  ? Procedure: HOLMIUM LASER APPLICATION;  Surgeon: Malen Gauze, MD;  Location: [facility];  Service: Urology;  Laterality: Left;  ? IR URETERAL STENT LEFT NEW ACCESS W/O SEP NEPHROSTOMY CATH  02/18/2018  ? NEPHROLITHOTOMY Left 12/07/2017  ? Procedure: LEFT NEPHROLITHOTOMY PERCUTANEOUS AND STENT PLACEMENT;  Surgeon: Malen Gauze, MD;  Location: [facility];  Service: Urology;  Laterality: Left;  ? NEPHROLITHOTOMY Left 02/19/2018  ? Procedure: NEPHROLITHOTOMY PERCUTANEOUS;  Surgeon: Malen Gauze, MD;  Location: [facility];  Service: Urology;  Laterality: Left;  ? NEPHROLITHOTOMY Left 02/25/2018  ? Procedure: NEPHROLITHOTOMY  PERCUTANEOUS SECOND LOOK;  Surgeon: Malen Gauze, MD;  Location: [facility];  Service: Urology;  Laterality: Left;  ? NEPHROSTOMY Left 10/27/2017  ? Procedure: NEPHROSTOMY TUBE PLACEMENT;  Surgeon: Malen Gauze, MD;  Location: [facility];  Service: Urology;  Laterality: Left;  ? ?Family History  ?Problem Relation Age of Onset  ? Anxiety disorder Mother   ? Obesity Mother   ? Obesity Father   ? Alcohol abuse Father   ? Heart disease Father   ? Hypertension Father   ? Arthritis Father   ? Mental retardation Father   ? Sudden death Father   ? Drug abuse Father   ? Cancer Sister 51  ?     ovarian  ? Arthritis Maternal Grandmother   ? COPD Maternal Grandfather   ? Heart disease Maternal Grandfather   ? Cancer Paternal Grandmother   ?     unknown, metz  ? Arthritis Paternal Grandmother   ? COPD Paternal Grandmother   ? Early death Paternal Grandmother   ? Cancer Paternal Grandfather   ?     pancreatic  ? Alcohol abuse Paternal Grandfather   ? Arthritis Paternal Grandfather   ? COPD Paternal Grandfather   ? Early death Paternal Grandfather   ? ?Social History  ? ?Social History Narrative  ? Married to [place]. 2 children [name] in [place].  ? 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  ? Drink caffeinated beverages, wears her seatbelt, wears a bike helmet.  ? Smoke detector in the home  ? Feels safe in her relationships  ? ? ?Allergies as of 03/25/2022   ? ?   Reactions  ? Bactrim [sulfamethoxazole-trimethoprim] Rash  ? ?  ? ?  ?Medication List  ?  ? ?  ? Accurate as of March 25, 2022  4:15 PM. If you have any questions, ask your nurse or doctor.  ?  ?  ? ?  ? ?levonorgestrel 20 MCG/24HR IUD ?Commonly known as: MIRENA ?1 each once by Intrauterine route. ?  ?levothyroxine 50 MCG tablet ?Commonly known as: SYNTHROID ?Take 1 tablet (50 mcg total) by mouth daily. ?  ?lisinopril-hydrochlorothiazide 10-12.5 MG tablet ?Commonly known as: ZESTORETIC ?Take 1 tablet by mouth daily. ?  ? ?  ? ? ?All past medical  history, surgical history, allergies, family history, immunizations andmedications were updated in the EMR today and reviewed under the history and medication portions of their EMR.    ? ? ? ?ROS: 14 pt review of systems performed and negative (unless mentioned in an HPI) ? ?Objective: ?BP 112/77   Pulse (!) 105   Temp 98.1 ?F (36.7 ?C) (Oral)   Ht 5\' 5"  (1.651  m)   Wt (!) 473 lb (214.6 kg)   SpO2 98%   BMI 78.71 kg/m?  ?Physical Exam ?Vitals and nursing note reviewed.  ?Constitutional:   ?   General: She is not in acute distress. ?   Appearance: Normal appearance. She is obese. She is not ill-appearing, toxic-appearing or diaphoretic.  ?HENT:  ?   Head: Normocephalic and atraumatic.  ?Eyes:  ?   General: No scleral icterus.    ?   Right eye: No discharge.     ?   Left eye: No discharge.  ?   Extraocular Movements: Extraocular movements intact.  ?   Conjunctiva/sclera: Conjunctivae normal.  ?   Pupils: Pupils are equal, round, and reactive to light.  ?Cardiovascular:  ?   Rate and Rhythm: Normal rate and regular rhythm.  ?   Heart sounds: No murmur heard. ?Pulmonary:  ?   Effort: Pulmonary effort is normal. No respiratory distress.  ?   Breath sounds: Normal breath sounds. No wheezing, rhonchi or rales.  ?Musculoskeletal:  ?   Cervical back: Neck supple. No tenderness.  ?   Right lower leg: No edema.  ?   Left lower leg: No edema.  ?Lymphadenopathy:  ?   Cervical: No cervical adenopathy.  ?Skin: ?   General: Skin is warm and dry.  ?   Coloration: Skin is not jaundiced or pale.  ?   Findings: No erythema or rash.  ?Neurological:  ?   Mental Status: She is alert and oriented to person, place, and time. Mental status is at baseline.  ?   Motor: No weakness.  ?   Gait: Gait normal.  ?Psychiatric:     ?   Mood and Affect: Mood normal.     ?   Behavior: Behavior normal.     ?   Thought Content: Thought content normal.     ?   Judgment: Judgment normal.  ? ? ?No results found. ? ?Assessment/plan: ?Teresa Osborne is  a 46 y.o. female present for  ?HTN/Morbid obesity with BMI of 70 and over, adult [facility](HCC) ?-stable ?- continue lisinopril-hctz 10-12.5 mg qd ?Reviewed cbc, cmp, tsh and lipids collected at weight loss clinic.  ?-

## 2022-03-27 ENCOUNTER — Ambulatory Visit (INDEPENDENT_AMBULATORY_CARE_PROVIDER_SITE_OTHER): Payer: Managed Care, Other (non HMO) | Admitting: Family Medicine

## 2022-03-27 ENCOUNTER — Encounter (INDEPENDENT_AMBULATORY_CARE_PROVIDER_SITE_OTHER): Payer: Self-pay | Admitting: Family Medicine

## 2022-03-27 VITALS — BP 134/80 | HR 92 | Temp 97.9°F | Ht 65.0 in | Wt >= 6400 oz

## 2022-03-27 DIAGNOSIS — E559 Vitamin D deficiency, unspecified: Secondary | ICD-10-CM

## 2022-03-27 DIAGNOSIS — E538 Deficiency of other specified B group vitamins: Secondary | ICD-10-CM

## 2022-03-27 DIAGNOSIS — I10 Essential (primary) hypertension: Secondary | ICD-10-CM | POA: Diagnosis not present

## 2022-03-27 DIAGNOSIS — R7303 Prediabetes: Secondary | ICD-10-CM | POA: Diagnosis not present

## 2022-03-27 DIAGNOSIS — E669 Obesity, unspecified: Secondary | ICD-10-CM

## 2022-03-27 DIAGNOSIS — E7849 Other hyperlipidemia: Secondary | ICD-10-CM | POA: Diagnosis not present

## 2022-03-27 DIAGNOSIS — Z9189 Other specified personal risk factors, not elsewhere classified: Secondary | ICD-10-CM

## 2022-03-27 DIAGNOSIS — Z6841 Body Mass Index (BMI) 40.0 and over, adult: Secondary | ICD-10-CM

## 2022-03-27 MED ORDER — VITAMIN D (ERGOCALCIFEROL) 1.25 MG (50000 UNIT) PO CAPS: 50000.0000 [IU] | ORAL_CAPSULE | ORAL | 0 refills | Status: DC

## 2022-04-05 NOTE — Progress Notes (Signed)
Chief Complaint:   OBESITY Teresa Osborne is here to discuss her progress with her obesity treatment plan along with follow-up of her obesity related diagnoses. Teresa Osborne is on the Category 4 Plan+200 calories and states she is following her eating plan approximately 70% of the time. Teresa Osborne states she is not exercising at this time. 0 minutes 0 times per week.  Today's visit was #: 2 Starting weight: 480 lbs Starting date: 03/12/2022 Today's weight: 466 lbs Today's date: 03/27/2022 Total lbs lost to date: 14 lbs Total lbs lost since last in-office visit: 14  Interim History: Teresa Osborne has enjoyed the meal plan over the 1st few weeks.  Teresa Osborne has not had any hunger except when it is time to eat, she denies any cravings.  Teresa Osborne had a [name] chocolate or packet of crackers for snack calories.  Teresa Osborne has no plans for the next few weeks, she does not feel any changes ned to be made to her her plan.   Subjective:   1. Essential hypertension Teresa Osborne's blood pressure is well controlled today.  Teresa Osborne denies any chest pain, chest pressure, or headaches.  2. Vitamin D deficiency Teresa Osborne is not on Vitamin D, but complains of fatigue.  Teresa Osborne's Vitamin D level was 19.3.  3. Other hyperlipidemia Teresa Osborne's LDL 135, HDL 54, Triglycerides 73, total cholesterol 202.  Teresa Osborne is currently not on any medications.   4. Prediabetes Teresa Osborne's A1c was 5.7, Insulin 44.5.  Teresa Osborne is currently not on medications. Teresa Osborne has some drives for carbs especially in the evening.  5. Vitamin B12 deficiency Teresa Osborne has complained of fatigue.  MCV, within normal limits, hemoglobin and hematocrit was normal.  No history of bariatric surgery or pernicious anemia.  6. At risk of diabetes mellitus Teresa Osborne is at higher than average risk for developing diabetes due to her obesity.   Assessment/Plan:   1. Essential hypertension Teresa Osborne has agreed to continue Zestoretic at current dose, no change in medications  today.   2. Vitamin D deficiency Teresa Osborne has agreed to to start prescription Vitamin D.  See below.  - Vitamin D, Ergocalciferol, (DRISDOL) 1.25 MG (50000 UNIT) CAPS capsule; Take 1 capsule (50,000 Units total) by mouth every 7 (seven) days.  Dispense: 4 capsule; Refill: 0  3. Other hyperlipidemia Teresa Osborne has agreed to Category 4 plan.  We will repeat labs in 3 months, currently not at goal yet.  4. Prediabetes Teresa Osborne agreed that if she keeps having increase in hunger or cravings, we will discuss medication initiation.   5. Vitamin B12 deficiency Teresa Osborne has agreed to start a multivitamin daily and repeat Vitamin B12 labs in 3 months.   6. At risk of diabetes mellitus Teresa Osborne was given approximately 15 minutes of diabetic education and counseling today. We discussed intensive lifestyle modifications today with an emphasis on weight loss as well as increasing exercise and decreasing simple carbohydrates in her diet. We also reviewed medication options with an emphasis on risk versus benefits of those discussed.  Repetitive spaced learning was employed today to elicit superior memory formation and behavioral change.   7. Obesity with current BMI of 77.7 Teresa Osborne is currently in the action stage of change. As such, her goal is to continue with weight loss efforts. She has agreed to the Category 4 Plan+ 200 calories.  Exercise goals: No exercise has been prescribed at this time.  Behavioral modification strategies: increasing lean protein intake, meal planning and cooking strategies, and keeping healthy foods in the home.  Teresa Osborne has agreed to  follow-up with our clinic in 3 weeks. She was informed of the importance of frequent follow-up visits to maximize her success with intensive lifestyle modifications for her multiple health conditions.  Objective:   Blood pressure 134/80, pulse 92, temperature 97.9 F (36.6 C), height 5\' 5"  (1.651 m), weight (!) 466 lb (211.4 kg), last menstrual  period 03/18/2022, SpO2 98 %. Body mass index is 77.55 kg/m.  General: Cooperative, alert, well developed, in no acute distress. HEENT: Conjunctivae and lids unremarkable. Cardiovascular: Regular rhythm.  Lungs: Normal work of breathing. Neurologic: No focal deficits.   Lab Results  Component Value Date   CREATININE 0.99 03/12/2022   BUN 13 03/12/2022   NA 140 03/12/2022   K 5.1 03/12/2022   CL 106 03/12/2022   CO2 21 03/12/2022   Lab Results  Component Value Date   ALT 11 03/12/2022   AST 12 03/12/2022   ALKPHOS 76 03/12/2022   BILITOT 0.3 03/12/2022   Lab Results  Component Value Date   HGBA1C 5.7 (H) 03/12/2022   HGBA1C 5.5 07/05/2021   HGBA1C 5.5 07/12/2020   HGBA1C 5.7 09/23/2019   HGBA1C 5.4 09/15/2018   Lab Results  Component Value Date   INSULIN 44.5 (H) 03/12/2022   Lab Results  Component Value Date   TSH 3.790 03/12/2022   Lab Results  Component Value Date   CHOL 202 (H) 03/12/2022   HDL 54 03/12/2022   LDLCALC 135 (H) 03/12/2022   TRIG 73 03/12/2022   CHOLHDL 3 07/05/2021   Lab Results  Component Value Date   VD25OH 19.3 (L) 03/12/2022   VD25OH 25.20 (L) 07/05/2021   VD25OH 17.37 (L) 09/23/2019   Lab Results  Component Value Date   WBC 6.3 03/12/2022   HGB 13.8 03/12/2022   HCT 41.1 03/12/2022   MCV 91 03/12/2022   PLT 353 03/12/2022   No results found for: IRON, TIBC, FERRITIN  Attestation Statements:   Reviewed by clinician on day of visit: allergies, medications, problem list, medical history, surgical history, family history, social history, and previous encounter notes.  I, 05/12/2022, RMA, am acting as transcriptionist for Teresa Metro, MD.  I have reviewed the above documentation for accuracy and completeness, and I agree with the above. - Teresa Likes, MD

## 2022-04-10 ENCOUNTER — Telehealth (INDEPENDENT_AMBULATORY_CARE_PROVIDER_SITE_OTHER): Payer: Managed Care, Other (non HMO) | Admitting: Family Medicine

## 2022-04-10 ENCOUNTER — Encounter (INDEPENDENT_AMBULATORY_CARE_PROVIDER_SITE_OTHER): Payer: Self-pay | Admitting: Family Medicine

## 2022-04-10 VITALS — Ht 65.0 in | Wt >= 6400 oz

## 2022-04-10 DIAGNOSIS — R7303 Prediabetes: Secondary | ICD-10-CM

## 2022-04-10 DIAGNOSIS — E669 Obesity, unspecified: Secondary | ICD-10-CM | POA: Diagnosis not present

## 2022-04-10 DIAGNOSIS — Z6841 Body Mass Index (BMI) 40.0 and over, adult: Secondary | ICD-10-CM

## 2022-04-10 DIAGNOSIS — E559 Vitamin D deficiency, unspecified: Secondary | ICD-10-CM | POA: Diagnosis not present

## 2022-04-10 MED ORDER — VITAMIN D (ERGOCALCIFEROL) 1.25 MG (50000 UNIT) PO CAPS: 50000.0000 [IU] | ORAL_CAPSULE | ORAL | 0 refills | Status: DC

## 2022-04-17 NOTE — Progress Notes (Signed)
TeleHealth Visit:  Due to the COVID-19 pandemic, this visit was completed with telemedicine (audio/video) technology to reduce patient and provider exposure as well as to preserve personal protective equipment.   [name] has verbally consented to this TeleHealth visit. The provider is located at home, the patient is located at the [facility] office. The participants in this visit include the listed provider and patient. The visit was conducted today via Mychart video.  Chief Complaint: OBESITY Teresa Osborne is here to discuss her progress with her obesity treatment plan along with follow-up of her obesity related diagnoses. Teresa Osborne is on the Category 4 Plan + 200 and states she is following her eating plan approximately 70% of the time. Cortnee states she is exercising  0 minutes 0 times per week.  Today's visit was #: 3 Starting weight: 480 lbs Starting date: 03/12/2022  Interim History: Since last appointment Teresa Osborne has been focusing on getting all food in on plan. Some days she isn't getting everything in. This weekend she is going out for Mother's Day. Denies any anticipated obstacles in the next few weeks.  Subjective:   1. Vitamin D deficiency Teresa Osborne is currently taking prescription Vit D. Denies any nausea, vomiting or muscle weakness.  2. Prediabetes Teresa Osborne's last A1c of 5.7. She is not currently on any medication.  Assessment/Plan:   1. Vitamin D deficiency We will refil Vit D 50K IU weekly for 1 month with zero refills.  -Refill Vitamin D, Ergocalciferol, (DRISDOL) 1.25 MG (50000 UNIT) CAPS capsule; Take 1 capsule (50,000 Units total) by mouth every 7 (seven) days.  Dispense: 4 capsule; Refill: 0  2. Prediabetes Teresa Osborne will continue on Cat 4 plan- she is encouraged to continue to try to get all food in.  3. Obesity with current BMI of 77.5 Teresa Osborne is currently in the action stage of change. As such, her goal is to continue with weight loss efforts. She  has agreed to the Category 4 Plan + 200.  Exercise goals: All adults should avoid inactivity. Some physical activity is better than none, and adults who participate in any amount of physical activity gain some health benefits.  Behavioral modification strategies: increasing lean protein intake, no skipping meals, meal planning and cooking strategies, keeping healthy foods in the home, and planning for success.  Teresa Osborne has agreed to follow-up with our clinic in 3 weeks. She was informed of the importance of frequent follow-up visits to maximize her success with intensive lifestyle modifications for her multiple health conditions.  Objective:   VITALS: Per patient if applicable, see vitals. GENERAL: Alert and in no acute distress. CARDIOPULMONARY: No increased WOB. Speaking in clear sentences.  PSYCH: Pleasant and cooperative. Speech normal rate and rhythm. Affect is appropriate. Insight and judgement are appropriate. Attention is focused, linear, and appropriate.  NEURO: Oriented as arrived to appointment on time with no prompting.   Lab Results  Component Value Date   CREATININE 0.99 03/12/2022   BUN 13 03/12/2022   NA 140 03/12/2022   K 5.1 03/12/2022   CL 106 03/12/2022   CO2 21 03/12/2022   Lab Results  Component Value Date   ALT 11 03/12/2022   AST 12 03/12/2022   ALKPHOS 76 03/12/2022   BILITOT 0.3 03/12/2022   Lab Results  Component Value Date   HGBA1C 5.7 (H) 03/12/2022   HGBA1C 5.5 07/05/2021   HGBA1C 5.5 07/12/2020   HGBA1C 5.7 09/23/2019   HGBA1C 5.4 09/15/2018   Lab Results  Component Value  Date   INSULIN 44.5 (H) 03/12/2022   Lab Results  Component Value Date   TSH 3.790 03/12/2022   Lab Results  Component Value Date   CHOL 202 (H) 03/12/2022   HDL 54 03/12/2022   LDLCALC 135 (H) 03/12/2022   TRIG 73 03/12/2022   CHOLHDL 3 07/05/2021   Lab Results  Component Value Date   VD25OH 19.3 (L) 03/12/2022   VD25OH 25.20 (L) 07/05/2021   VD25OH 17.37  (L) 09/23/2019   Lab Results  Component Value Date   WBC 6.3 03/12/2022   HGB 13.8 03/12/2022   HCT 41.1 03/12/2022   MCV 91 03/12/2022   PLT 353 03/12/2022   No results found for: IRON, TIBC, FERRITIN  Attestation Statements:   Reviewed by clinician on day of visit: allergies, medications, problem list, medical history, surgical history, family history, social history, and previous encounter notes.  I, Fortino Sic, RMA am acting as transcriptionist for Reuben Likes, MD.  I have reviewed the above documentation for accuracy and completeness, and I agree with the above. - Reuben Likes, MD

## 2022-05-01 ENCOUNTER — Ambulatory Visit (INDEPENDENT_AMBULATORY_CARE_PROVIDER_SITE_OTHER): Payer: Managed Care, Other (non HMO) | Admitting: Family Medicine

## 2022-05-20 ENCOUNTER — Ambulatory Visit (INDEPENDENT_AMBULATORY_CARE_PROVIDER_SITE_OTHER): Payer: Managed Care, Other (non HMO) | Admitting: Family Medicine

## 2022-05-28 ENCOUNTER — Telehealth: Payer: Self-pay

## 2022-05-28 NOTE — Telephone Encounter (Signed)
LVM for pt to cb re: order that has been placed and see if she has received kit.  Note: Please encourage pt to complete Cologuard

## 2022-06-11 ENCOUNTER — Ambulatory Visit (INDEPENDENT_AMBULATORY_CARE_PROVIDER_SITE_OTHER): Payer: Managed Care, Other (non HMO) | Admitting: Family Medicine

## 2022-06-16 ENCOUNTER — Encounter (INDEPENDENT_AMBULATORY_CARE_PROVIDER_SITE_OTHER): Payer: Self-pay

## 2022-07-09 ENCOUNTER — Encounter (INDEPENDENT_AMBULATORY_CARE_PROVIDER_SITE_OTHER): Payer: Self-pay

## 2022-09-17 ENCOUNTER — Encounter (INDEPENDENT_AMBULATORY_CARE_PROVIDER_SITE_OTHER): Payer: Self-pay | Admitting: Family Medicine

## 2022-09-17 ENCOUNTER — Telehealth (INDEPENDENT_AMBULATORY_CARE_PROVIDER_SITE_OTHER): Payer: Managed Care, Other (non HMO) | Admitting: Family Medicine

## 2022-09-17 VITALS — BP 142/81 | HR 95 | Temp 97.7°F | Ht 65.0 in | Wt >= 6400 oz

## 2022-09-17 DIAGNOSIS — E7849 Other hyperlipidemia: Secondary | ICD-10-CM | POA: Diagnosis not present

## 2022-09-17 DIAGNOSIS — E559 Vitamin D deficiency, unspecified: Secondary | ICD-10-CM | POA: Diagnosis not present

## 2022-09-17 DIAGNOSIS — I10 Essential (primary) hypertension: Secondary | ICD-10-CM

## 2022-09-17 DIAGNOSIS — Z6841 Body Mass Index (BMI) 40.0 and over, adult: Secondary | ICD-10-CM

## 2022-09-17 DIAGNOSIS — R7303 Prediabetes: Secondary | ICD-10-CM

## 2022-09-17 DIAGNOSIS — E669 Obesity, unspecified: Secondary | ICD-10-CM

## 2022-09-17 DIAGNOSIS — E039 Hypothyroidism, unspecified: Secondary | ICD-10-CM

## 2022-09-17 DIAGNOSIS — E538 Deficiency of other specified B group vitamins: Secondary | ICD-10-CM

## 2022-09-19 LAB — LIPID PANEL WITH LDL/HDL RATIO
Cholesterol, Total: 198 mg/dL (ref 100–199)
HDL: 50 mg/dL (ref >39)
LDL Chol Calc (NIH): 131 mg/dL — ABNORMAL HIGH (ref 0–99)
LDL/HDL Ratio: 2.6 ratio (ref 0.0–3.2)
Triglycerides: 92 mg/dL (ref 0–149)
VLDL Cholesterol Cal: 17 mg/dL (ref 5–40)

## 2022-09-19 LAB — COMPREHENSIVE METABOLIC PANEL
ALT: 15 IU/L (ref 0–32)
AST: 13 IU/L (ref 0–40)
Albumin/Globulin Ratio: 1.3 (ref 1.2–2.2)
Albumin: 4 g/dL (ref 3.9–4.9)
Alkaline Phosphatase: 79 IU/L (ref 44–121)
BUN/Creatinine Ratio: 12 (ref 9–23)
BUN: 12 mg/dL (ref 6–24)
Bilirubin Total: 0.3 mg/dL (ref 0.0–1.2)
CO2: 24 mmol/L (ref 20–29)
Calcium: 9.5 mg/dL (ref 8.7–10.2)
Chloride: 103 mmol/L (ref 96–106)
Creatinine, Ser: 1.03 mg/dL — ABNORMAL HIGH (ref 0.57–1.00)
Globulin, Total: 3.2 g/dL (ref 1.5–4.5)
Glucose: 89 mg/dL (ref 70–99)
Potassium: 4.6 mmol/L (ref 3.5–5.2)
Sodium: 141 mmol/L (ref 134–144)
Total Protein: 7.2 g/dL (ref 6.0–8.5)
eGFR: 68 mL/min/{1.73_m2} (ref >59)

## 2022-09-19 LAB — VITAMIN D 25 HYDROXY (VIT D DEFICIENCY, FRACTURES): Vit D, 25-Hydroxy: 23.7 ng/mL — ABNORMAL LOW (ref 30.0–100.0)

## 2022-09-19 LAB — HEMOGLOBIN A1C
Est. average glucose Bld gHb Est-mCnc: 120 mg/dL
Hgb A1c MFr Bld: 5.8 % — ABNORMAL HIGH (ref 4.8–5.6)

## 2022-09-19 LAB — TSH: TSH: 2.54 u[IU]/mL (ref 0.450–4.500)

## 2022-09-19 LAB — VITAMIN B12: Vitamin B-12: 224 pg/mL — ABNORMAL LOW (ref 232–1245)

## 2022-09-19 LAB — INSULIN, RANDOM: INSULIN: 38.4 u[IU]/mL — ABNORMAL HIGH (ref 2.6–24.9)

## 2022-09-23 NOTE — Progress Notes (Signed)
Chief Complaint:   OBESITY Teresa Osborne is here to discuss her progress with her obesity treatment plan along with follow-up of her obesity related diagnoses. Teresa Osborne is on the Category 4 Plan +200 and states she is following her eating plan approximately 0% of the time. Teresa Osborne states she is exercising 0 minutes 0 times per week.  Today's visit was #: 3 Starting weight: 466 lbs Starting date: 03/27/2022 Today's weight: 474 lbs Today's date: 09/17/2022 Total lbs lost to date: 0 lbs Total lbs lost since last in-office visit: 0  Interim History: Teresa Osborne returned to clinic since 04/10/22. She wants to get to beginning back on Cat 4. She wants to get back to Cat plan. Plans to go grocery shopping today. Going [place] [employer] [place].  Subjective:   1. Essential hypertension Teresa Osborne's blood pressure well controlled today. Denies chest pain, chest pressure and headache.  2. Prediabetes Teresa Osborne's A1c at 5.7, insulin at 44.5. She is not on medication.  3. Vitamin D deficiency Teresa Osborne's Vit D level of 19.3. Denies any nausea, vomiting or muscle weakness.  4. Other hyperlipidemia Teresa Osborne's last LDL 135, HDL 54, Trigly 73.  5. Hypothyroidism, unspecified type Denies chest pain, chest pressure and headache. Teresa Osborne is on Levothyroxine.  6. Vitamin B12 deficiency Teresa Osborne's last B12 level of 219. Not on multivitamin.  Assessment/Plan:   1. Essential hypertension We will obtain labs today. Continue Zestoretic.  - Comprehensive metabolic panel  2. Prediabetes We will obtain labs today.  - Hemoglobin A1c - Insulin, random  3. Vitamin D deficiency We will obtain labs today.  - VITAMIN D 25 Hydroxy (Vit-D Deficiency, Fractures)  4. Other hyperlipidemia We will obtain labs today.  - Lipid Panel With LDL/HDL Ratio  5. Hypothyroidism, unspecified type We will obtain labs today.  - TSH  6. Vitamin B12 deficiency We will obtain labs today.  - Vitamin B12  7.  Obesity with current BMI of 79.0 Teresa Osborne is currently in the action stage of change. As such, her goal is to continue with weight loss efforts. She has agreed to the Category 4 Plan +200.  Exercise goals: No exercise has been prescribed at this time.  Behavioral modification strategies: increasing lean protein intake, meal planning and cooking strategies, keeping healthy foods in the home, and planning for success.  Teresa Osborne has agreed to follow-up with our clinic in 3 weeks. She was informed of the importance of frequent follow-up visits to maximize her success with intensive lifestyle modifications for her multiple health conditions.   Objective:   Blood pressure (!) 142/81, pulse 95, temperature 97.7 F (36.5 C), height 5\' 5"  (1.651 m), weight (!) 474 lb (215 kg), SpO2 98 %. Body mass index is 78.88 kg/m.  General: Cooperative, alert, well developed, in no acute distress. HEENT: Conjunctivae and lids unremarkable. Cardiovascular: Regular rhythm.  Lungs: Normal work of breathing. Neurologic: No focal deficits.   Lab Results  Component Value Date   CREATININE 1.03 (H) 09/17/2022   BUN 12 09/17/2022   NA 141 09/17/2022   K 4.6 09/17/2022   CL 103 09/17/2022   CO2 24 09/17/2022   Lab Results  Component Value Date   ALT 15 09/17/2022   AST 13 09/17/2022   ALKPHOS 79 09/17/2022   BILITOT 0.3 09/17/2022   Lab Results  Component Value Date   HGBA1C 5.8 (H) 09/17/2022   HGBA1C 5.7 (H) 03/12/2022   HGBA1C 5.5 07/05/2021   HGBA1C 5.5 07/12/2020   HGBA1C 5.7 09/23/2019  Lab Results  Component Value Date   INSULIN 38.4 (H) 09/17/2022   INSULIN 44.5 (H) 03/12/2022   Lab Results  Component Value Date   TSH 2.540 09/17/2022   Lab Results  Component Value Date   CHOL 198 09/17/2022   HDL 50 09/17/2022   LDLCALC 131 (H) 09/17/2022   TRIG 92 09/17/2022   CHOLHDL 3 07/05/2021   Lab Results  Component Value Date   VD25OH 23.7 (L) 09/17/2022   VD25OH 19.3 (L) 03/12/2022    VD25OH 25.20 (L) 07/05/2021   Lab Results  Component Value Date   WBC 6.3 03/12/2022   HGB 13.8 03/12/2022   HCT 41.1 03/12/2022   MCV 91 03/12/2022   PLT 353 03/12/2022   No results found for: "IRON", "TIBC", "FERRITIN"  Attestation Statements:   Reviewed by clinician on day of visit: allergies, medications, problem list, medical history, surgical history, family history, social history, and previous encounter notes.  I, Elnora Morrison, RMA am acting as transcriptionist for Coralie Common, MD.  I have reviewed the above documentation for accuracy and completeness, and I agree with the above. - Coralie Common, MD

## 2022-10-07 ENCOUNTER — Encounter (INDEPENDENT_AMBULATORY_CARE_PROVIDER_SITE_OTHER): Payer: Self-pay | Admitting: Family Medicine

## 2022-10-07 ENCOUNTER — Ambulatory Visit (INDEPENDENT_AMBULATORY_CARE_PROVIDER_SITE_OTHER): Payer: Managed Care, Other (non HMO) | Admitting: Family Medicine

## 2022-10-07 VITALS — BP 131/81 | HR 105 | Temp 98.1°F | Ht 65.0 in | Wt >= 6400 oz

## 2022-10-07 DIAGNOSIS — E669 Obesity, unspecified: Secondary | ICD-10-CM

## 2022-10-07 DIAGNOSIS — I10 Essential (primary) hypertension: Secondary | ICD-10-CM

## 2022-10-07 DIAGNOSIS — R7303 Prediabetes: Secondary | ICD-10-CM | POA: Diagnosis not present

## 2022-10-07 DIAGNOSIS — E7849 Other hyperlipidemia: Secondary | ICD-10-CM

## 2022-10-07 DIAGNOSIS — E559 Vitamin D deficiency, unspecified: Secondary | ICD-10-CM | POA: Diagnosis not present

## 2022-10-07 DIAGNOSIS — Z6841 Body Mass Index (BMI) 40.0 and over, adult: Secondary | ICD-10-CM

## 2022-10-07 MED ORDER — VITAMIN D (ERGOCALCIFEROL) 1.25 MG (50000 UNIT) PO CAPS: 50000.0000 [IU] | ORAL_CAPSULE | ORAL | 0 refills | Status: DC

## 2022-10-14 NOTE — Progress Notes (Signed)
Chief Complaint:   OBESITY Teresa Osborne is here to discuss her progress with her obesity treatment plan along with follow-up of her obesity related diagnoses. Teresa Osborne is on the Category 4 Plan +200 and states she is following her eating plan approximately 60% of the time. Teresa Osborne states she is exercising 0 minutes 0 times per week.  Today's visit was #: 4 Starting weight: 480 lbs Starting date: 03/12/2022 Today's weight: 466 lbs Today's date: 10/07/2022 Total lbs lost to date: 14 lbs Total lbs lost since last in-office visit: 0  Interim History: Teresa Osborne recognizes she did not eat all food on plan due to lack of hunger. She really focused on getting significant amount of water in. Every meal she is missing a bit. Some days she is not getting all snacks in. Staying local for Thanksgiving.  Subjective:   1. Vitamin D deficiency Labs discussed during visit today. Teresa Osborne is currently taking prescription Vit D 50,000 IU once a week. Denies any nausea, vomiting or muscle weakness. She notes fatigue. Last Vit D level of 23.7.  2. Essential hypertension Teresa Osborne's blood pressure is well controlled. Denies chest pain, chest pressure and headache. She is on Zestoretic 10-12.5 mg daily.  3. Prediabetes Labs discussed during visit today. Teresa Osborne's last A1c was 5.8, Insulin 38.4. She is not on medication.  4. Other hyperlipidemia Labs discussed during visit today. Teresa Osborne's last LDL of 131, HDL of 50 and Trigly of 92. She is not on medication.  Assessment/Plan:   1. Vitamin D deficiency We will refill Vit D 50K IU once a week for 1 month with 0 refills.  -Refill Vitamin D, Ergocalciferol, (DRISDOL) 1.25 MG (50000 UNIT) CAPS capsule; Take 1 capsule (50,000 Units total) by mouth every 7 (seven) days.  Dispense: 4 capsule; Refill: 0  2. Essential hypertension Continue with current medications without any changes in dose.  3. Prediabetes Will repeat labs in 3 months and follow Cat 3 without  changes in plan.  4. Other hyperlipidemia Continue with current meal plan without any changes.  5. Obesity with current BMI of 79.0 Teresa Osborne is currently in the action stage of change. As such, her goal is to continue with weight loss efforts. She has agreed to the Category 4 Plan +200.  Exercise goals: No exercise has been prescribed at this time.  Behavioral modification strategies: increasing lean protein intake, meal planning and cooking strategies, keeping healthy foods in the home, and planning for success.  Teresa Osborne has agreed to follow-up with our clinic in 3 weeks. She was informed of the importance of frequent follow-up visits to maximize her success with intensive lifestyle modifications for her multiple health conditions.   Objective:   Blood pressure 131/81, pulse (!) 105, temperature 98.1 F (36.7 C), height 5\' 5"  (1.651 m), weight (!) 466 lb (211.4 kg), SpO2 96 %. Body mass index is 77.55 kg/m.  General: Cooperative, alert, well developed, in no acute distress. HEENT: Conjunctivae and lids unremarkable. Cardiovascular: Regular rhythm.  Lungs: Normal work of breathing. Neurologic: No focal deficits.   Lab Results  Component Value Date   CREATININE 1.03 (H) 09/17/2022   BUN 12 09/17/2022   NA 141 09/17/2022   K 4.6 09/17/2022   CL 103 09/17/2022   CO2 24 09/17/2022   Lab Results  Component Value Date   ALT 15 09/17/2022   AST 13 09/17/2022   ALKPHOS 79 09/17/2022   BILITOT 0.3 09/17/2022   Lab Results  Component Value Date   HGBA1C 5.8 (  H) 09/17/2022   HGBA1C 5.7 (H) 03/12/2022   HGBA1C 5.5 07/05/2021   HGBA1C 5.5 07/12/2020   HGBA1C 5.7 09/23/2019   Lab Results  Component Value Date   INSULIN 38.4 (H) 09/17/2022   INSULIN 44.5 (H) 03/12/2022   Lab Results  Component Value Date   TSH 2.540 09/17/2022   Lab Results  Component Value Date   CHOL 198 09/17/2022   HDL 50 09/17/2022   LDLCALC 131 (H) 09/17/2022   TRIG 92 09/17/2022   CHOLHDL 3  07/05/2021   Lab Results  Component Value Date   VD25OH 23.7 (L) 09/17/2022   VD25OH 19.3 (L) 03/12/2022   VD25OH 25.20 (L) 07/05/2021   Lab Results  Component Value Date   WBC 6.3 03/12/2022   HGB 13.8 03/12/2022   HCT 41.1 03/12/2022   MCV 91 03/12/2022   PLT 353 03/12/2022   No results found for: "IRON", "TIBC", "FERRITIN"  Attestation Statements:   Reviewed by clinician on day of visit: allergies, medications, problem list, medical history, surgical history, family history, social history, and previous encounter notes.  I, Fortino Sic, RMA am acting as transcriptionist for Reuben Likes, MD.  I have reviewed the above documentation for accuracy and completeness, and I agree with the above. - Reuben Likes, MD

## 2022-10-29 ENCOUNTER — Telehealth (INDEPENDENT_AMBULATORY_CARE_PROVIDER_SITE_OTHER): Payer: Self-pay | Admitting: Family Medicine

## 2022-10-29 NOTE — Telephone Encounter (Signed)
fyi

## 2022-10-29 NOTE — Telephone Encounter (Signed)
Pt called wanting Dr. Marquis Lunch to know that she is doing fine. 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 Pt wanted Dr. Marquis Lunch to know that she is doing ok. [facility].

## 2022-11-03 ENCOUNTER — Other Ambulatory Visit (INDEPENDENT_AMBULATORY_CARE_PROVIDER_SITE_OTHER): Payer: Self-pay | Admitting: Family Medicine

## 2022-11-03 DIAGNOSIS — E559 Vitamin D deficiency, unspecified: Secondary | ICD-10-CM

## 2022-11-04 ENCOUNTER — Ambulatory Visit (INDEPENDENT_AMBULATORY_CARE_PROVIDER_SITE_OTHER): Payer: Managed Care, Other (non HMO) | Admitting: Family Medicine

## 2022-11-06 ENCOUNTER — Other Ambulatory Visit: Payer: Self-pay

## 2022-11-06 MED ORDER — LISINOPRIL-HYDROCHLOROTHIAZIDE 10-12.5 MG PO TABS: 1.0000 | ORAL_TABLET | Freq: Every day | ORAL | 0 refills | Status: DC

## 2022-12-03 ENCOUNTER — Encounter (INDEPENDENT_AMBULATORY_CARE_PROVIDER_SITE_OTHER): Payer: Self-pay | Admitting: Family Medicine

## 2022-12-03 NOTE — Telephone Encounter (Signed)
Please review

## 2022-12-04 ENCOUNTER — Encounter: Payer: Self-pay | Admitting: Family Medicine

## 2022-12-04 ENCOUNTER — Ambulatory Visit: Payer: Managed Care, Other (non HMO) | Admitting: Family Medicine

## 2022-12-04 VITALS — BP 130/70 | HR 114 | Temp 97.4°F | Ht 65.0 in | Wt >= 6400 oz

## 2022-12-04 DIAGNOSIS — N3000 Acute cystitis without hematuria: Secondary | ICD-10-CM | POA: Diagnosis not present

## 2022-12-04 DIAGNOSIS — R300 Dysuria: Secondary | ICD-10-CM | POA: Diagnosis not present

## 2022-12-04 LAB — POCT URINALYSIS DIPSTICK
Bilirubin, UA: NEGATIVE
Blood, UA: NEGATIVE
Glucose, UA: NEGATIVE
Ketones, UA: NEGATIVE
Nitrite, UA: NEGATIVE
Protein, UA: POSITIVE — AB
Spec Grav, UA: 1.02 (ref 1.010–1.025)
Urobilinogen, UA: 0.2 E.U./dL
pH, UA: 6 (ref 5.0–8.0)

## 2022-12-04 MED ORDER — CIPROFLOXACIN HCL 500 MG PO TABS: 500.0000 mg | ORAL_TABLET | Freq: Two times a day (BID) | ORAL | 0 refills | Status: DC

## 2022-12-04 NOTE — Progress Notes (Signed)
OFFICE VISIT  12/04/2022  CC:  Chief Complaint  Patient presents with   Back Pain   Dysuria    Cramps, frequency, odor, dark yellow urine. Pt does hydrate well    Patient is a 47 y.o. female who presents for urinary concerns.  HPI: Onset 4 days ago of burning with urination, urinary urgency, and urinary frequency. Felt some diffuse low back pain and subjective fever. No flank pain or groin pain.  She has had mild nausea but no vomiting. No blood in urine. At the onset she did a virtual visit with a medical provider through her employer and was prescribed Macrobid. She has not improved.  Pertinent past history: History of large obstructing stone that required nephrolithotomy/nephrostomy tube placement--> 2019.  Past Medical History:  Diagnosis Date   Anxiety    Bilateral swelling of feet    History of kidney stones    Hypertension    Hypothyroid    Joint pain    Kidney problem    Kidney stones    Staghorn   PONV (postoperative nausea and vomiting)    NOV 2018, JAN TREMORS LASTED ABOUT 15 MINUTES, LIKES [place]; no issues wuth last kidney stone surgery    Pre-diabetes    Prediabetes    SOB (shortness of breath)    Thyroid disease    Vaginal Pap smear, abnormal    Vitamin D deficiency     Past Surgical History:  Procedure Laterality Date   CYSTOSCOPY  10/27/2017   Procedure: CYSTOSCOPY FLEXIBLE;  Surgeon: Cleon Gustin, MD;  [facility]: [facility];  Service: Urology;;   CYSTOSCOPY WITH RETROGRADE PYELOGRAM, URETEROSCOPY AND STENT PLACEMENT Left 01/21/2018   Procedure: [place], URETEROSCOPY AND STENT PLACEMENT;  Surgeon: Cleon Gustin, MD;  [facility];  Service: Urology;  Laterality: Left;   HOLMIUM LASER APPLICATION Left 3/71/6967   Procedure: HOLMIUM LASER APPLICATION;  Surgeon: Cleon Gustin, MD;  [facility];  Service: Urology;  Laterality: Left;   IR URETERAL STENT LEFT NEW ACCESS W/O SEP NEPHROSTOMY CATH   02/18/2018   NEPHROLITHOTOMY Left 12/07/2017   Procedure: LEFT NEPHROLITHOTOMY PERCUTANEOUS AND STENT PLACEMENT;  Surgeon: Cleon Gustin, MD;  [facility];  Service: Urology;  Laterality: Left;   NEPHROLITHOTOMY Left 02/19/2018   Procedure: NEPHROLITHOTOMY PERCUTANEOUS;  Surgeon: Cleon Gustin, MD;  [facility];  Service: Urology;  Laterality: Left;   NEPHROLITHOTOMY Left 02/25/2018   Procedure: NEPHROLITHOTOMY PERCUTANEOUS SECOND LOOK;  Surgeon: Cleon Gustin, MD;  [facility];  Service: Urology;  Laterality: Left;   NEPHROSTOMY Left 10/27/2017   Procedure: NEPHROSTOMY TUBE PLACEMENT;  Surgeon: Cleon Gustin, MD;  [facility];  Service: Urology;  Laterality: Left;    Outpatient Medications Prior to Visit  Medication Sig Dispense Refill   levonorgestrel (MIRENA) 20 MCG/24HR IUD 1 each once by Intrauterine route.     levothyroxine (SYNTHROID) 50 MCG tablet Take 1 tablet (50 mcg total) by mouth daily. 90 tablet 3   lisinopril-hydrochlorothiazide (ZESTORETIC) 10-12.5 MG tablet Take 1 tablet by mouth daily. 30 tablet 0   nitrofurantoin, macrocrystal-monohydrate, (MACROBID) 100 MG capsule Take 100 mg by mouth 2 (two) times daily.     Vitamin D, Ergocalciferol, (DRISDOL) 1.25 MG (50000 UNIT) CAPS capsule Take 1 capsule (50,000 Units total) by mouth every 7 (seven) days. 4 capsule 0   No facility-administered medications prior to visit.    Allergies  Allergen Reactions   Bactrim [Sulfamethoxazole-Trimethoprim] Rash  Review of Systems  As per HPI  PE:    12/04/2022    3:58 PM 10/07/2022    2:00 PM 09/17/2022   10:00 AM  Vitals with BMI  Height _0  _1  _2   Weight 477 lbs 466 lbs 474 lbs  BMI 79.38 70.34 03.52  Systolic 481 859 093  Diastolic 70 81 81  Pulse 112 105 95     Physical Exam  Gen: Alert, well appearing.  Patient is oriented to person, place, time, and situation. AFFECT: pleasant, lucid thought and speech. No further  exam today  LABS:  Last metabolic panel Lab Results  Component Value Date   GLUCOSE 89 09/17/2022   NA 141 09/17/2022   K 4.6 09/17/2022   CL 103 09/17/2022   CO2 24 09/17/2022   BUN 12 09/17/2022   CREATININE 1.03 (H) 09/17/2022   EGFR 68 09/17/2022   CALCIUM 9.5 09/17/2022   PROT 7.2 09/17/2022   ALBUMIN 4.0 09/17/2022   LABGLOB 3.2 09/17/2022   AGRATIO 1.3 09/17/2022   BILITOT 0.3 09/17/2022   ALKPHOS 79 09/17/2022   AST 13 09/17/2022   ALT 15 09/17/2022   ANIONGAP 7 02/20/2018   Lab Results  Component Value Date   HGBA1C 5.8 (H) 09/17/2022   IMPRESSION AND PLAN:  Acute UTI, symptoms unresponsive to empiric trial of Macrobid. UA today showed 3+ LEU, 2+ prot, otherwise normal. No sign of pyelonephritis or kidney stone at this time. Will treat with Cipro 500 mg twice daily x 5 days. Sent urine specimen for culture today.  An After Visit Summary was printed and given to the patient.  FOLLOW UP: Return if symptoms worsen or fail to improve.  Signed:  Crissie Sickles, MD           12/04/2022

## 2022-12-07 ENCOUNTER — Encounter: Payer: Self-pay | Admitting: Family Medicine

## 2022-12-07 LAB — URINE CULTURE
MICRO NUMBER:: 14389144
SPECIMEN QUALITY:: ADEQUATE

## 2022-12-08 ENCOUNTER — Ambulatory Visit (INDEPENDENT_AMBULATORY_CARE_PROVIDER_SITE_OTHER): Payer: Managed Care, Other (non HMO) | Admitting: Family Medicine

## 2022-12-08 ENCOUNTER — Encounter (INDEPENDENT_AMBULATORY_CARE_PROVIDER_SITE_OTHER): Payer: Self-pay | Admitting: Family Medicine

## 2022-12-08 ENCOUNTER — Encounter: Payer: Self-pay | Admitting: Family Medicine

## 2022-12-08 VITALS — BP 129/77 | HR 79 | Temp 97.5°F | Ht 65.0 in | Wt >= 6400 oz

## 2022-12-08 DIAGNOSIS — Z6841 Body Mass Index (BMI) 40.0 and over, adult: Secondary | ICD-10-CM

## 2022-12-08 DIAGNOSIS — E669 Obesity, unspecified: Secondary | ICD-10-CM | POA: Diagnosis not present

## 2022-12-08 DIAGNOSIS — E559 Vitamin D deficiency, unspecified: Secondary | ICD-10-CM | POA: Diagnosis not present

## 2022-12-08 DIAGNOSIS — I10 Essential (primary) hypertension: Secondary | ICD-10-CM

## 2022-12-08 MED ORDER — VITAMIN D (ERGOCALCIFEROL) 1.25 MG (50000 UNIT) PO CAPS: 50000.0000 [IU] | ORAL_CAPSULE | ORAL | 0 refills | Status: DC

## 2022-12-08 MED ORDER — LISINOPRIL-HYDROCHLOROTHIAZIDE 10-12.5 MG PO TABS: 1.0000 | ORAL_TABLET | Freq: Every day | ORAL | 0 refills | Status: DC

## 2022-12-11 ENCOUNTER — Encounter: Payer: Self-pay | Admitting: Family Medicine

## 2022-12-12 MED ORDER — CIPROFLOXACIN HCL 500 MG PO TABS: 500.0000 mg | ORAL_TABLET | Freq: Two times a day (BID) | ORAL | 0 refills | Status: AC

## 2022-12-17 ENCOUNTER — Encounter (INDEPENDENT_AMBULATORY_CARE_PROVIDER_SITE_OTHER): Payer: Self-pay | Admitting: Family Medicine

## 2022-12-17 NOTE — Telephone Encounter (Signed)
Please advise 

## 2022-12-24 ENCOUNTER — Encounter: Payer: Self-pay | Admitting: Family Medicine

## 2022-12-24 MED ORDER — FLUCONAZOLE 150 MG PO TABS: 150.0000 mg | ORAL_TABLET | Freq: Once | ORAL | 0 refills | Status: AC

## 2022-12-24 NOTE — Telephone Encounter (Signed)
Ok, diflucan eRXd

## 2022-12-25 ENCOUNTER — Encounter (INDEPENDENT_AMBULATORY_CARE_PROVIDER_SITE_OTHER): Payer: Self-pay | Admitting: Family Medicine

## 2022-12-25 ENCOUNTER — Ambulatory Visit (INDEPENDENT_AMBULATORY_CARE_PROVIDER_SITE_OTHER): Payer: Managed Care, Other (non HMO) | Admitting: Family Medicine

## 2022-12-25 VITALS — BP 137/80 | HR 94 | Temp 97.6°F | Ht 65.0 in

## 2022-12-25 DIAGNOSIS — E669 Obesity, unspecified: Secondary | ICD-10-CM | POA: Diagnosis not present

## 2022-12-25 DIAGNOSIS — Z6841 Body Mass Index (BMI) 40.0 and over, adult: Secondary | ICD-10-CM | POA: Diagnosis not present

## 2022-12-25 DIAGNOSIS — E559 Vitamin D deficiency, unspecified: Secondary | ICD-10-CM

## 2022-12-25 DIAGNOSIS — R7303 Prediabetes: Secondary | ICD-10-CM

## 2022-12-25 MED ORDER — VITAMIN D (ERGOCALCIFEROL) 1.25 MG (50000 UNIT) PO CAPS: 50000.0000 [IU] | ORAL_CAPSULE | ORAL | 0 refills | Status: DC

## 2023-01-06 NOTE — Progress Notes (Signed)
Chief Complaint:   Teresa Osborne is here to discuss her progress with her obesity treatment plan along with follow-up of her obesity related diagnoses. Teresa Osborne is on the Category 4 Plan +200 and states she is following her eating plan approximately 50% of the time. Teresa Osborne states she is exercising 0 minutes 0 times per week.  Today's visit was #: 5 Starting weight: 480 lbs Starting date: 03/12/2022 Today's weight: 474 lbs Today's date: 12/08/2022 Total lbs lost to date: 6 lbs Total lbs lost since last in-office visit: 0  Interim History: Teresa Osborne missed last appointment due to new job and responsibilities of trying new hires.  She recognizes she has not been as compliant to meal plan as she would have liked.  Thinks biggest obstacle will be herself and staying compliant.  Subjective:   1. Essential hypertension Blood pressure well-controlled today.  On Zestoretic.  2. Vitamin D deficiency Teresa Osborne is currently taking prescription Vit D 50,000 IU once a week.   Denies any nausea, vomiting or muscle weakness.  Assessment/Plan:   1. Essential hypertension Will refill lisinopril -hydrochlorothiazide 10-12.5 mg by mouth daily for 3 months with 0 refills.  -Refill lisinopril-hydrochlorothiazide (ZESTORETIC) 10-12.5 MG tablet; Take 1 tablet by mouth daily.  Dispense: 90 tablet; Refill: 0  2. Vitamin D deficiency Will refill vitamin D 50 K IU/once a week for 1 month.  3. BMI 70 and over, adult [facility]) Teresa Osborne is currently in the action stage of change. As such, her goal is to continue with weight loss efforts. She has agreed to the Category 4 Plan +200.   Exercise goals: No exercise has been prescribed at this time.  Behavioral modification strategies: increasing lean protein intake, meal planning and cooking strategies, keeping healthy foods in the home, and planning for success.  Teresa Osborne has agreed to follow-up with our clinic in 3 weeks. She was informed of the importance of  frequent follow-up visits to maximize her success with intensive lifestyle modifications for her multiple health conditions.   Objective:   Blood pressure 129/77, pulse 79, temperature (!) 97.5 F (36.4 C), height 5\' 5"  (1.651 m), weight (!) 474 lb (215 kg), SpO2 99 %. Body mass index is 78.88 kg/m.  General: Cooperative, alert, well developed, in no acute distress. HEENT: Conjunctivae and lids unremarkable. Cardiovascular: Regular rhythm.  Lungs: Normal work of breathing. Neurologic: No focal deficits.   Lab Results  Component Value Date   CREATININE 1.03 (H) 09/17/2022   BUN 12 09/17/2022   NA 141 09/17/2022   K 4.6 09/17/2022   CL 103 09/17/2022   CO2 24 09/17/2022   Lab Results  Component Value Date   ALT 15 09/17/2022   AST 13 09/17/2022   ALKPHOS 79 09/17/2022   BILITOT 0.3 09/17/2022   Lab Results  Component Value Date   HGBA1C 5.8 (H) 09/17/2022   HGBA1C 5.7 (H) 03/12/2022   HGBA1C 5.5 07/05/2021   HGBA1C 5.5 07/12/2020   HGBA1C 5.7 09/23/2019   Lab Results  Component Value Date   INSULIN 38.4 (H) 09/17/2022   INSULIN 44.5 (H) 03/12/2022   Lab Results  Component Value Date   TSH 2.540 09/17/2022   Lab Results  Component Value Date   CHOL 198 09/17/2022   HDL 50 09/17/2022   LDLCALC 131 (H) 09/17/2022   TRIG 92 09/17/2022   CHOLHDL 3 07/05/2021   Lab Results  Component Value Date   VD25OH 23.7 (L) 09/17/2022   VD25OH 19.3 (L) 03/12/2022  VD25OH 25.20 (L) 07/05/2021   Lab Results  Component Value Date   WBC 6.3 03/12/2022   HGB 13.8 03/12/2022   HCT 41.1 03/12/2022   MCV 91 03/12/2022   PLT 353 03/12/2022   No results found for: "IRON", "TIBC", "FERRITIN"  Attestation Statements:   Reviewed by clinician on day of visit: allergies, medications, problem list, medical history, surgical history, family history, social history, and previous encounter notes.  I, Elnora Morrison, RMA am acting as transcriptionist for Coralie Common,  MD.  I have reviewed the above documentation for accuracy and completeness, and I agree with the above. - Coralie Common, MD

## 2023-01-06 NOTE — Progress Notes (Signed)
Chief Complaint:   [place] is here to discuss her progress with her obesity treatment plan along with follow-up of her obesity related diagnoses. Cary is on the Category 4 Plan and states she is following her eating plan approximately 60% of the time. Minori states she is exercising 0 minutes 0 times per week.  Today's visit was #: 6 Starting weight: 480 lbs Starting date: 03/12/2022 Today's weight: 471 lbs Today's date: 12/25/2022 Total lbs lost to date: 9 lbs Total lbs lost since last in-office visit: 3  Interim History: Reni voices she feels really good-much lighter than she did previously.  Daughter's birthday is next weekend and she will be going to [employer].  Cellulitis 15 with category 4+200  Subjective:   1. Vitamin D deficiency [place] is currently taking prescription Vit D 50,000 IU once a week.   Denies any nausea, vomiting or muscle weakness.  She notes fatigue.  2. Prediabetes Last A1c 5.8, insulin 38.4.  Not on medications.  Assessment/Plan:   1. Vitamin D deficiency We will refill Vit D 50K IU once a week for 1 month with 0 refills.  -Refill Vitamin D, Ergocalciferol, (DRISDOL) 1.25 MG (50000 UNIT) CAPS capsule; Take 1 capsule (50,000 Units total) by mouth every 7 (seven) days.  Dispense: 4 capsule; Refill: 0  2. Prediabetes Continue with category 4 plan; labs with PCP.  3. Obesity with current BMI of 78.5 Lu is currently in the action stage of change. As such, her goal is to continue with weight loss efforts. She has agreed to the Category 4 Plan +200.   Exercise goals: No exercise has been prescribed at this time.  Behavioral modification strategies: increasing lean protein intake, meal planning and cooking strategies, keeping healthy foods in the home, and planning for success.  Kairee has agreed to follow-up with our clinic in 3 weeks. She was informed of the importance of frequent follow-up visits to maximize her success with  intensive lifestyle modifications for her multiple health conditions.   Objective:   Blood pressure 137/80, pulse 94, temperature 97.6 F (36.4 C), height 5' 5"$  (1.651 m), SpO2 98 %. Body mass index is 78.88 kg/m.  General: Cooperative, alert, well developed, in no acute distress. HEENT: Conjunctivae and lids unremarkable. Cardiovascular: Regular rhythm.  Lungs: Normal work of breathing. Neurologic: No focal deficits.   Lab Results  Component Value Date   CREATININE 1.03 (H) 09/17/2022   BUN 12 09/17/2022   NA 141 09/17/2022   K 4.6 09/17/2022   CL 103 09/17/2022   CO2 24 09/17/2022   Lab Results  Component Value Date   ALT 15 09/17/2022   AST 13 09/17/2022   ALKPHOS 79 09/17/2022   BILITOT 0.3 09/17/2022   Lab Results  Component Value Date   HGBA1C 5.8 (H) 09/17/2022   HGBA1C 5.7 (H) 03/12/2022   HGBA1C 5.5 07/05/2021   HGBA1C 5.5 07/12/2020   HGBA1C 5.7 09/23/2019   Lab Results  Component Value Date   INSULIN 38.4 (H) 09/17/2022   INSULIN 44.5 (H) 03/12/2022   Lab Results  Component Value Date   TSH 2.540 09/17/2022   Lab Results  Component Value Date   CHOL 198 09/17/2022   HDL 50 09/17/2022   LDLCALC 131 (H) 09/17/2022   TRIG 92 09/17/2022   CHOLHDL 3 07/05/2021   Lab Results  Component Value Date   VD25OH 23.7 (L) 09/17/2022   VD25OH 19.3 (L) 03/12/2022   VD25OH 25.20 (L) 07/05/2021  Lab Results  Component Value Date   WBC 6.3 03/12/2022   HGB 13.8 03/12/2022   HCT 41.1 03/12/2022   MCV 91 03/12/2022   PLT 353 03/12/2022   No results found for: "IRON", "TIBC", "FERRITIN"  Attestation Statements:   Reviewed by clinician on day of visit: allergies, medications, problem list, medical history, surgical history, family history, social history, and previous encounter notes.  I, Elnora Morrison, RMA am acting as transcriptionist for Coralie Common, MD.  I have reviewed the above documentation for accuracy and completeness, and I agree with  the above. - Coralie Common, MD

## 2023-01-07 ENCOUNTER — Other Ambulatory Visit: Payer: Self-pay | Admitting: Family Medicine

## 2023-01-07 DIAGNOSIS — Z1231 Encounter for screening mammogram for malignant neoplasm of breast: Secondary | ICD-10-CM

## 2023-01-13 ENCOUNTER — Ambulatory Visit: Payer: Managed Care, Other (non HMO) | Admitting: Women's Health

## 2023-01-15 ENCOUNTER — Ambulatory Visit (INDEPENDENT_AMBULATORY_CARE_PROVIDER_SITE_OTHER): Payer: Managed Care, Other (non HMO) | Admitting: Family Medicine

## 2023-01-21 ENCOUNTER — Ambulatory Visit (INDEPENDENT_AMBULATORY_CARE_PROVIDER_SITE_OTHER): Payer: Managed Care, Other (non HMO) | Admitting: Family Medicine

## 2023-02-02 ENCOUNTER — Other Ambulatory Visit (INDEPENDENT_AMBULATORY_CARE_PROVIDER_SITE_OTHER): Payer: Self-pay | Admitting: Family Medicine

## 2023-02-02 ENCOUNTER — Ambulatory Visit: Payer: Managed Care, Other (non HMO) | Admitting: Women's Health

## 2023-02-02 DIAGNOSIS — I10 Essential (primary) hypertension: Secondary | ICD-10-CM

## 2023-02-19 ENCOUNTER — Other Ambulatory Visit (INDEPENDENT_AMBULATORY_CARE_PROVIDER_SITE_OTHER): Payer: Self-pay | Admitting: Family Medicine

## 2023-02-19 DIAGNOSIS — I10 Essential (primary) hypertension: Secondary | ICD-10-CM

## 2023-03-24 ENCOUNTER — Inpatient Hospital Stay: Admission: RE | Admit: 2023-03-24 | Payer: Managed Care, Other (non HMO) | Source: Ambulatory Visit

## 2023-03-31 ENCOUNTER — Encounter: Payer: Self-pay | Admitting: Family Medicine

## 2023-03-31 ENCOUNTER — Ambulatory Visit: Payer: Managed Care, Other (non HMO) | Admitting: Family Medicine

## 2023-03-31 VITALS — BP 118/70 | HR 87 | Temp 97.8°F | Ht 65.35 in | Wt >= 6400 oz

## 2023-03-31 DIAGNOSIS — R7303 Prediabetes: Secondary | ICD-10-CM

## 2023-03-31 DIAGNOSIS — Z0000 Encounter for general adult medical examination without abnormal findings: Secondary | ICD-10-CM

## 2023-03-31 DIAGNOSIS — E034 Atrophy of thyroid (acquired): Secondary | ICD-10-CM | POA: Diagnosis not present

## 2023-03-31 DIAGNOSIS — I10 Essential (primary) hypertension: Secondary | ICD-10-CM | POA: Diagnosis not present

## 2023-03-31 DIAGNOSIS — Z1211 Encounter for screening for malignant neoplasm of colon: Secondary | ICD-10-CM

## 2023-03-31 DIAGNOSIS — E538 Deficiency of other specified B group vitamins: Secondary | ICD-10-CM

## 2023-03-31 DIAGNOSIS — Z1231 Encounter for screening mammogram for malignant neoplasm of breast: Secondary | ICD-10-CM

## 2023-03-31 DIAGNOSIS — Z6841 Body Mass Index (BMI) 40.0 and over, adult: Secondary | ICD-10-CM

## 2023-03-31 DIAGNOSIS — E559 Vitamin D deficiency, unspecified: Secondary | ICD-10-CM | POA: Diagnosis not present

## 2023-03-31 MED ORDER — LISINOPRIL-HYDROCHLOROTHIAZIDE 10-12.5 MG PO TABS: 1.0000 | ORAL_TABLET | Freq: Every day | ORAL | 1 refills | Status: DC

## 2023-03-31 NOTE — Progress Notes (Signed)
Patient ID: Teresa Osborne, female  DOB: Oct 05, 1976, 47 y.o.   MRN: [id number] Patient Care Team    Relationship Specialty Notifications Start End  Natalia Leatherwood, DO PCP - General Family Medicine  01/09/16   Malen Gauze, MD Consulting Physician Urology  12/12/19     Chief Complaint  Patient presents with   Annual Exam    Pt is fasting; [facility]    Subjective: Teresa Osborne is a 47 y.o.  Female  present for CPE and chronic condition appointment All past medical history, surgical history, allergies, family history, immunizations, medications and social history were updated in the electronic medical record today. All recent labs, ED visits and hospitalizations within the last year were reviewed.  Health maintenance:  Colonoscopy: No fhx. Screen due at 45 > cologuard ordered again Mammogram: Patient has declined scheduling her mammograms in the past.  She now has 1 scheduled 04/29/2023 Cervical cancer screening: Completed 09/19/2021- [facility] year 2022 Immunizations: tdap updated 2018, declines flu shot- encouraged yearly. .  Infectious disease screening: HIV completed  2008. Hep C completed DEXA: n/a Patient has a Dental home. Hospitalizations/ED visits: Reviewed  HTN: Pt reports compliance with lisinopril-HCTZ . Patient denies chest pain, shortness of breath or lower extremity edema.    RF: HTN, morbid obesity, HD fhx     03/12/2022    8:31 AM 09/23/2021    9:16 AM 07/05/2021    8:00 AM 07/12/2020   10:02 AM 09/23/2019    9:34 AM  Depression screen PHQ 2/9  Decreased Interest 3 0 0 0 0  Down, Depressed, Hopeless 2 0 0 0 0  PHQ - 2 Score 5 0 0 0 0  Altered sleeping 1 0     Tired, decreased energy 3 1     Change in appetite 1 1     Feeling bad or failure about yourself  3 1     Trouble concentrating 0 0     Moving slowly or fidgety/restless 3 0     Suicidal thoughts 0 0     PHQ-9 Score 16 3     Difficult doing work/chores Very difficult           09/23/2021    9:17 AM  GAD 7 : Generalized Anxiety Score  Nervous, Anxious, on Edge 1  Control/stop worrying 0  Worry too much - different things 0  Trouble relaxing 0  Restless 0  Easily annoyed or irritable 1  Afraid - awful might happen 0  Total GAD 7 Score 2     Immunization History  Administered Date(s) Administered   Tdap 12/01/2006, 09/14/2017    Past Medical History:  Diagnosis Date   Anxiety    Bilateral swelling of feet    History of kidney stones    Hypertension    Hypothyroid    Joint pain    Kidney problem    Kidney stones    Stag[place]   PONV (postoperative nausea and vomiting)    NOV 2018, JAN TREMORS LASTED ABOUT 15 MINUTES, LIKES [place] P[place]; no issues wuth last kidney stone surgery    Pre-diabetes    Prediabetes    SOB (shortness of breath)    Thyroid disease    Vaginal Pap smear, abnormal    Vitamin D deficiency    Allergies  Allergen Reactions   Bactrim [Sulfamethoxazole-Trimethoprim] Rash   Past Surgical History:  Procedure Laterality Date   CYSTOSCOPY  10/27/2017   Procedure: CYSTOSCOPY FLEXIBLE;  Surgeon: Malen Gauze, MD;  [facility];  Service: Urology;;   CYSTOSCOPY WITH RETROGRADE PYELOGRAM, URETEROSCOPY AND STENT PLACEMENT Left 01/21/2018   Procedure: CYSTOSCOPY WITH RETROGRADE PYELOGRAM, URETEROSCOPY AND STENT PLACEMENT;  Surgeon: Malen Gauze, MD;  [facility];  Service: Urology;  Laterality: Left;   HOLMIUM LASER APPLICATION Left 01/21/2018   Procedure: HOLMIUM LASER APPLICATION;  Surgeon: Malen Gauze, MD;  [facility];  Service: Urology;  Laterality: Left;   IR URETERAL STENT LEFT NEW ACCESS W/O SEP NEPHROSTOMY CATH  02/18/2018   NEPHROLITHOTOMY Left 12/07/2017   Procedure: LEFT NEPHROLITHOTOMY PERCUTANEOUS AND STENT PLACEMENT;  Surgeon: Malen Gauze, MD;  [facility];  Service: Urology;  Laterality: Left;   NEPHROLITHOTOMY Left 02/19/2018   Procedure: NEPHROLITHOTOMY PERCUTANEOUS;   Surgeon: Malen Gauze, MD;  [facility];  Service: Urology;  Laterality: Left;   NEPHROLITHOTOMY Left 02/25/2018   Procedure: NEPHROLITHOTOMY PERCUTANEOUS SECOND LOOK;  Surgeon: Malen Gauze, MD;  [facility];  Service: Urology;  Laterality: Left;   NEPHROSTOMY Left 10/27/2017   Procedure: NEPHROSTOMY TUBE PLACEMENT;  Surgeon: Malen Gauze, MD;  [facility];  Service: Urology;  Laterality: Left;   Family History  Problem Relation Age of Onset   Anxiety disorder Mother    Obesity Mother    Obesity Father    Alcohol abuse Father    Heart disease Father    Hypertension Father    Arthritis Father    Mental retardation Father    Sudden death Father    Drug abuse Father    Cancer Sister 35       ovarian   Arthritis Maternal Grandmother    COPD Maternal Grandfather    Heart disease Maternal Grandfather    Cancer Paternal Grandmother        unknown, [place]   Arthritis Paternal Grandmother    COPD Paternal Grandmother    Early death Paternal Grandmother    Cancer Paternal Grandfather        pancreatic   Alcohol abuse Paternal Grandfather    Arthritis Paternal Grandfather    COPD Paternal Grandfather    Early death Paternal Grandfather    Social History   Social History Narrative   Married to [place]. 2 children [name] in [place].   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   Drink caffeinated beverages, wears her seatbelt, wears a bike helmet.   Smoke detector in the home   Feels safe in her relationships    Allergies as of 03/31/2023       Reactions   Bactrim [sulfamethoxazole-trimethoprim] Rash        Medication List        Accurate as of March 31, 2023  8:59 AM. If you have any questions, ask your nurse or doctor.          STOP taking these medications    levonorgestrel 20 MCG/24HR IUD Commonly known as: MIRENA Stopped by: Felix Pacini, DO       TAKE these medications    levothyroxine 50 MCG  tablet Commonly known as: SYNTHROID Take 1 tablet (50 mcg total) by mouth daily.   lisinopril-hydrochlorothiazide 10-12.5 MG tablet Commonly known as: ZESTORETIC Take 1 tablet by mouth daily.   Vitamin D (Ergocalciferol) 1.25 MG (50000 UNIT) Caps capsule Commonly known as: DRISDOL Take 1 capsule (50,000 Units total) by mouth every 7 (seven) days.        All past medical history, surgical history, allergies, family  history, immunizations andmedications were updated in the EMR today and reviewed under the history and medication portions of their EMR.     No results found for this or any previous visit (from the past 2160 hour(s)).   ROS: 14 pt review of systems performed and negative (unless mentioned in an HPI)  Objective: BP 118/70   Pulse 87   Temp 97.8 F (36.6 C)   Ht 5' 5.35" (1.66 m)   Wt (!) 483 lb 6.4 oz (219.3 kg)   SpO2 95%   BMI 79.57 kg/m  Physical Exam Vitals and nursing note reviewed.  Constitutional:      General: She is not in acute distress.    Appearance: Normal appearance. She is obese. She is not ill-appearing or toxic-appearing.  HENT:     Head: Normocephalic and atraumatic.     Right Ear: Tympanic membrane, ear canal and external ear normal. There is no impacted cerumen.     Left Ear: Tympanic membrane, ear canal and external ear normal. There is no impacted cerumen.     Nose: No congestion or rhinorrhea.     Mouth/Throat:     Mouth: Mucous membranes are moist.     Pharynx: Oropharynx is clear. No oropharyngeal exudate or posterior oropharyngeal erythema.  Eyes:     General: No scleral icterus.       Right eye: No discharge.        Left eye: No discharge.     Extraocular Movements: Extraocular movements intact.     Conjunctiva/sclera: Conjunctivae normal.     Pupils: Pupils are equal, round, and reactive to light.  Cardiovascular:     Rate and Rhythm: Normal rate and regular rhythm.     Pulses: Normal pulses.     Heart sounds: Normal heart  sounds. No murmur heard.    No friction rub. No gallop.  Pulmonary:     Effort: Pulmonary effort is normal. No respiratory distress.     Breath sounds: Normal breath sounds. No stridor. No wheezing, rhonchi or rales.  Chest:     Chest wall: No tenderness.  Abdominal:     General: Abdomen is flat. Bowel sounds are normal. There is no distension.     Palpations: Abdomen is soft. There is no mass.     Tenderness: There is no abdominal tenderness. There is no right CVA tenderness, left CVA tenderness, guarding or rebound.     Hernia: No hernia is present.  Musculoskeletal:        General: No swelling, tenderness or deformity. Normal range of motion.     Cervical back: Normal range of motion and neck supple. No rigidity or tenderness.     Right lower leg: No edema.     Left lower leg: No edema.  Lymphadenopathy:     Cervical: No cervical adenopathy.  Skin:    General: Skin is warm and dry.     Coloration: Skin is not jaundiced or pale.     Findings: No bruising, erythema, lesion or rash.  Neurological:     General: No focal deficit present.     Mental Status: She is alert and oriented to person, place, and time. Mental status is at baseline.     Cranial Nerves: No cranial nerve deficit.     Sensory: No sensory deficit.     Motor: No weakness.     Coordination: Coordination normal.     Gait: Gait normal.     Deep Tendon Reflexes: Reflexes normal.  Psychiatric:  Mood and Affect: Mood normal.        Behavior: Behavior normal.        Thought Content: Thought content normal.        Judgment: Judgment normal.     No results found.  Assessment/plan: Jalexa Pifer is a 47 y.o. female present for CPE and chronic condition management combination appointment Vitamin D deficiency Continue supplementation   HTN/Morbid obesity with BMI of 70 and over, adult (HCC) Stable Continue lisinopril-hctz 10-12.5 mg qd - increase exercise - low sodium diet.  BC, CMP, TSH and lipids  collected today  Hypothyroidism, unspecified type TSH and T4 free collected today. Continue levothyroxine 50 mcg daily.  Refills will be provided in appropriate dose after results received  Encounter for general adult medical examination  Patient was encouraged to exercise greater than 150 minutes a week. Patient was encouraged to choose a diet filled with fresh fruits and vegetables, and lean meats. AVS provided to patient today for education/recommendation on gender specific health and safety maintenance. Colonoscopy: No fhx. Screen due at 45 > cologuard ordered again Mammogram: Patient has declined scheduling her mammograms in the past.  She now has 1 scheduled 04/29/2023 Cervical cancer screening: Completed 09/19/2021- [facility] 2022 Immunizations: tdap updated 2018, declines flu shot- encouraged yearly. .  Infectious disease screening: HIV completed  2008. Hep C completed DEXA: n/a   Pt expressed desire for weight loss counseling. She was provided with the following information verbally and printed.   No follow-ups on file.   Orders Placed This Encounter  Procedures   CBC with Differential/Platelet   Comprehensive metabolic panel   Hemoglobin A1c   Lipid panel   TSH   T4, free   B12   Vitamin D (25 hydroxy)   Cologuard   Meds ordered this encounter  Medications   lisinopril-hydrochlorothiazide (ZESTORETIC) 10-12.5 MG tablet    Sig: Take 1 tablet by mouth daily.    Dispense:  90 tablet    Refill:  1   Referral Orders  No referral(s) requested today     Electronically signed by: Felix Pacini, DO [facility] [place]

## 2023-03-31 NOTE — Patient Instructions (Signed)
     If you are interested in weight loss counseling please make appt to discuss and bring with you 2 weeks of a food diary/log on notebook paper.  Food log is mandatory on first appt to proceed with counseling.  Weight loss counseling encompasses diet, exercise and can include  medications when appropriate and affordable.  There are routine appts for check-ins and weights to track progress and keep you on track.  Routine check-ins (in person) are also mandatory to continue with prescription refills. Check-in timeline  can range from 4 weeks to 12 weeks, depending on physician's recommendations and which step you are in of your weight loss journey.  Your BMI today is Body mass index is 79.57 kg/m.  Please check with your insurance prior to appt and ask them if they cover weight loss medications for your BMI? And if so, which medications. They may tell you some of the diabetes meds that are used for weight loss also,  are on your formulary- but this does not mean they are covered for weight loss only.  Even if they tell with a prior auth it is covered- make sure they check to see if you personally meet criteria with your BMI.

## 2023-04-01 LAB — CBC WITH DIFFERENTIAL/PLATELET
Basophils Absolute: 0.1 10*3/uL (ref 0.0–0.2)
Basos: 1 %
EOS (ABSOLUTE): 0.3 10*3/uL (ref 0.0–0.4)
Eos: 4 %
Hematocrit: 40.1 % (ref 34.0–46.6)
Hemoglobin: 12.9 g/dL (ref 11.1–15.9)
Immature Grans (Abs): 0 10*3/uL (ref 0.0–0.1)
Immature Granulocytes: 0 %
Lymphocytes Absolute: 1.5 10*3/uL (ref 0.7–3.1)
Lymphs: 24 %
MCH: 29.7 pg (ref 26.6–33.0)
MCHC: 32.2 g/dL (ref 31.5–35.7)
MCV: 92 fL (ref 79–97)
Monocytes Absolute: 0.4 10*3/uL (ref 0.1–0.9)
Monocytes: 6 %
Neutrophils Absolute: 4.2 10*3/uL (ref 1.4–7.0)
Neutrophils: 65 %
Platelets: 332 10*3/uL (ref 150–450)
RBC: 4.35 x10E6/uL (ref 3.77–5.28)
RDW: 12.8 % (ref 11.7–15.4)
WBC: 6.4 10*3/uL (ref 3.4–10.8)

## 2023-04-01 LAB — COMPREHENSIVE METABOLIC PANEL
ALT: 13 IU/L (ref 0–32)
AST: 13 IU/L (ref 0–40)
Albumin/Globulin Ratio: 1.4 (ref 1.2–2.2)
Albumin: 3.9 g/dL (ref 3.9–4.9)
Alkaline Phosphatase: 81 IU/L (ref 44–121)
BUN/Creatinine Ratio: 11 (ref 9–23)
BUN: 11 mg/dL (ref 6–24)
Bilirubin Total: 0.3 mg/dL (ref 0.0–1.2)
CO2: 19 mmol/L — ABNORMAL LOW (ref 20–29)
Calcium: 8.9 mg/dL (ref 8.7–10.2)
Chloride: 107 mmol/L — ABNORMAL HIGH (ref 96–106)
Creatinine, Ser: 1.03 mg/dL — ABNORMAL HIGH (ref 0.57–1.00)
Globulin, Total: 2.7 g/dL (ref 1.5–4.5)
Glucose: 107 mg/dL — ABNORMAL HIGH (ref 70–99)
Potassium: 5 mmol/L (ref 3.5–5.2)
Sodium: 141 mmol/L (ref 134–144)
Total Protein: 6.6 g/dL (ref 6.0–8.5)
eGFR: 67 mL/min/{1.73_m2} (ref >59)

## 2023-04-01 LAB — HEMOGLOBIN A1C
Est. average glucose Bld gHb Est-mCnc: 123 mg/dL
Hgb A1c MFr Bld: 5.9 % — ABNORMAL HIGH (ref 4.8–5.6)

## 2023-04-01 LAB — TSH: TSH: 3.54 u[IU]/mL (ref 0.450–4.500)

## 2023-04-01 LAB — T4, FREE: Free T4: 1.22 ng/dL (ref 0.82–1.77)

## 2023-04-01 LAB — VITAMIN D 25 HYDROXY (VIT D DEFICIENCY, FRACTURES): Vit D, 25-Hydroxy: 23.6 ng/mL — ABNORMAL LOW (ref 30.0–100.0)

## 2023-04-01 LAB — LIPID PANEL
Chol/HDL Ratio: 3.8 ratio (ref 0.0–4.4)
Cholesterol, Total: 178 mg/dL (ref 100–199)
HDL: 47 mg/dL (ref >39)
LDL Chol Calc (NIH): 110 mg/dL — ABNORMAL HIGH (ref 0–99)
Triglycerides: 114 mg/dL (ref 0–149)
VLDL Cholesterol Cal: 21 mg/dL (ref 5–40)

## 2023-04-01 LAB — VITAMIN B12: Vitamin B-12: 228 pg/mL — ABNORMAL LOW (ref 232–1245)

## 2023-04-02 ENCOUNTER — Telehealth: Payer: Self-pay | Admitting: Family Medicine

## 2023-04-02 DIAGNOSIS — E559 Vitamin D deficiency, unspecified: Secondary | ICD-10-CM

## 2023-04-02 MED ORDER — LEVOTHYROXINE SODIUM 50 MCG PO TABS: 50.0000 ug | ORAL_TABLET | Freq: Every day | ORAL | 3 refills | Status: DC

## 2023-04-02 MED ORDER — VITAMIN D (ERGOCALCIFEROL) 1.25 MG (50000 UNIT) PO CAPS: 50000.0000 [IU] | ORAL_CAPSULE | ORAL | 3 refills | Status: DC

## 2023-04-02 NOTE — Telephone Encounter (Signed)
Please call patient Liver, kidney and thyroid function are normal.  Refilled her thyroid medication for her at same dose. Blood cell counts and electrolytes are normal Diabetes screening/A1c is 5.9.  Was 5.8 in October Cholesterol panel is at goal.  Vitamin D is low at 23.6.  I have refilled the once weekly high-dose vitamin D prescription for her to continue indefinitely.  B12 is severely low at 228.  Recommend she schedule B12 injections every 2 weeks for 4 doses and then once monthly.  Her B12 has been consistently low for over a year, suggesting she is of not absorbing B12 from her diet or orally.   -If she does not want to have injections, then she can try B12 1500-2000 mcg daily.  But she must purchased the B12 solution that is placed under the tongue and held for 30 seconds.  This type of B12 also must be taken daily.

## 2023-04-02 NOTE — Telephone Encounter (Signed)
Spoke with patient regarding results/recommendations. Pt elected to try the sublingual

## 2023-04-20 ENCOUNTER — Encounter: Payer: Self-pay | Admitting: Family Medicine

## 2023-04-22 ENCOUNTER — Telehealth: Payer: Self-pay

## 2023-04-22 ENCOUNTER — Other Ambulatory Visit: Payer: Self-pay | Admitting: Family Medicine

## 2023-04-22 ENCOUNTER — Encounter: Payer: Self-pay | Admitting: Family Medicine

## 2023-04-22 ENCOUNTER — Ambulatory Visit: Payer: Managed Care, Other (non HMO) | Admitting: Family Medicine

## 2023-04-22 DIAGNOSIS — Z6841 Body Mass Index (BMI) 40.0 and over, adult: Secondary | ICD-10-CM | POA: Diagnosis not present

## 2023-04-22 DIAGNOSIS — I10 Essential (primary) hypertension: Secondary | ICD-10-CM

## 2023-04-22 DIAGNOSIS — M545 Low back pain, unspecified: Secondary | ICD-10-CM

## 2023-04-22 DIAGNOSIS — Z713 Dietary counseling and surveillance: Secondary | ICD-10-CM

## 2023-04-22 MED ORDER — MOUNJARO 2.5 MG/0.5ML ~~LOC~~ SOAJ: 2.5000 mg | SUBCUTANEOUS | 0 refills | Status: DC

## 2023-04-22 NOTE — Patient Instructions (Signed)
Weekly net calorie calculator.   Applications for calorie counting.   glycemic index. exercise goal of 150 minutes a week (plus warm up and cool down) of cardiovascular exercise.   adequate water consumption of at least 100 ounces a day  Exercise and healthy eating are a must to reach your goals.  You will achieve your goals if you commit to your health 100%. No shortcuts.    - Being and feeling overweight/obese is tough.    - Achieving your healthy body is tough.        - You get to choose your "tough."    Return in about 4 weeks (around 05/20/2023) for weight loss.        Great to see you today.  I have refilled the medication(s) we provide.   If labs were collected, we will inform you of lab results once received either by echart message or telephone call.   - echart message- for normal results that have been seen by the patient already.   - telephone call: abnormal results or if patient has not viewed results in their echart.

## 2023-04-22 NOTE — Addendum Note (Signed)
Addended by: Felix Pacini A on: 04/22/2023 01:15 PM   Modules accepted: Orders

## 2023-04-22 NOTE — Telephone Encounter (Signed)
Please complete PA

## 2023-04-22 NOTE — Progress Notes (Addendum)
Teresa Osborne , 05-03-1976, 47 y.o., female MRN: [id number] Patient Care Team    Relationship Specialty Notifications Start End  Natalia Leatherwood, DO PCP - General Family Medicine  01/09/16   Malen Gauze, MD Consulting Physician Urology  12/12/19     Chief Complaint  Patient presents with   Obesity     Subjective: Teresa Osborne is a 47 y.o. Pt presents for an OV with complaints of morbid obesity  Patient reports she would like assistance in weight loss counseling.  She has difficulty exercising secondary to low back pain.  She reports she knows her low back pain is from her weight.  Last A1c was elevated.  Glucose greater than 126 in the past. Wt/lbs: 480 BMI: 79.91 Exercise: Patient does not exercise Diet: She currently is not watching her diet or counting calories.  She has counting calories in the past.  She brings her food log with her as requested which have very few vegetables over 2 weeks and mostly complex carbs. Water: 2 bottles Calories: Does not count Barriers: Meal prepping and lower back pain     03/31/2023    9:14 AM 03/12/2022    8:31 AM 09/23/2021    9:16 AM 07/05/2021    8:00 AM 07/12/2020   10:02 AM  Depression screen PHQ 2/9  Decreased Interest 1 3 0 0 0  Down, Depressed, Hopeless 1 2 0 0 0  PHQ - 2 Score 2 5 0 0 0  Altered sleeping 0 1 0    Tired, decreased energy 1 3 1     Change in appetite 0 1 1    Feeling bad or failure about yourself  1 3 1     Trouble concentrating 0 0 0    Moving slowly or fidgety/restless 0 3 0    Suicidal thoughts 0 0 0    PHQ-9 Score 4 16 3     Difficult doing work/chores Not difficult at all Very difficult       Allergies  Allergen Reactions   Bactrim [Sulfamethoxazole-Trimethoprim] Rash   Social History   Social History Narrative   Married [place]. 2 children [name] in [place].   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   Drink caffeinated beverages, wears her seatbelt, wears a bike  helmet.   Smoke detector in the home   Feels safe in her relationships   Past Medical History:  Diagnosis Date   Anxiety    Bilateral swelling of feet    History of kidney stones    Hypertension    Hypothyroid    Joint pain    Kidney problem    Kidney stones    Stag[place]   PONV (postoperative nausea and vomiting)    NOV 2018, JAN TREMORS LASTED ABOUT 15 MINUTES, LIKES SCOPOLOMINE PATCH; no issues wuth last kidney stone surgery    Pre-diabetes    Prediabetes    SOB (shortness of breath)    Thyroid disease    Vaginal Pap smear, abnormal    Vitamin D deficiency    Past Surgical History:  Procedure Laterality Date   CYSTOSCOPY  10/27/2017   Procedure: CYSTOSCOPY FLEXIBLE;  Surgeon: Malen Gauze, MD;  Location: [facility];  Service: Urology;;   CYSTOSCOPY WITH RETROGRADE PYELOGRAM, URETEROSCOPY AND STENT PLACEMENT Left 01/21/2018   Procedure: CYSTOSCOPY WITH RETROGRADE PYELOGRAM, URETEROSCOPY AND STENT PLACEMENT;  Surgeon: Malen Gauze, MD;  Location: [facility];  Service: Urology;  Laterality: Left;   HOLMIUM LASER  APPLICATION Left 01/21/2018   Procedure: HOLMIUM LASER APPLICATION;  Surgeon: Malen Gauze, MD;  Location: [facility];  Service: Urology;  Laterality: Left;   IR URETERAL STENT LEFT NEW ACCESS W/O SEP NEPHROSTOMY CATH  02/18/2018   NEPHROLITHOTOMY Left 12/07/2017   Procedure: LEFT NEPHROLITHOTOMY PERCUTANEOUS AND STENT PLACEMENT;  Surgeon: Malen Gauze, MD;  Location: [facility];  Service: Urology;  Laterality: Left;   NEPHROLITHOTOMY Left 02/19/2018   Procedure: NEPHROLITHOTOMY PERCUTANEOUS;  Surgeon: Malen Gauze, MD;  Location: [facility];  Service: Urology;  Laterality: Left;   NEPHROLITHOTOMY Left 02/25/2018   Procedure: NEPHROLITHOTOMY PERCUTANEOUS SECOND LOOK;  Surgeon: Malen Gauze, MD;  Location: [facility];  Service: Urology;  Laterality: Left;   NEPHROSTOMY Left 10/27/2017   Procedure: NEPHROSTOMY TUBE PLACEMENT;  Surgeon: Malen Gauze, MD;   Location: [facility];  Service: Urology;  Laterality: Left;   Family History  Problem Relation Age of Onset   Anxiety disorder Mother    Obesity Mother    Obesity Father    Alcohol abuse Father    Heart disease Father    Hypertension Father    Arthritis Father    Mental retardation Father    Sudden death Father    Drug abuse Father    Cancer Sister 106       ovarian   Arthritis Maternal Grandmother    COPD Maternal Grandfather    Heart disease Maternal Grandfather    Cancer Paternal Grandmother        unknown, [place]   Arthritis Paternal Grandmother    COPD Paternal Grandmother    Early death Paternal Grandmother    Cancer Paternal Grandfather        pancreatic   Alcohol abuse Paternal Grandfather    Arthritis Paternal Grandfather    COPD Paternal Grandfather    Early death Paternal Grandfather    Allergies as of 04/22/2023       Reactions   Bactrim [sulfamethoxazole-trimethoprim] Rash        Medication List        Accurate as of Apr 22, 2023 11:19 AM. If you have any questions, ask your nurse or doctor.          levothyroxine 50 MCG tablet Commonly known as: SYNTHROID Take 1 tablet (50 mcg total) by mouth daily.   lisinopril-hydrochlorothiazide 10-12.5 MG tablet Commonly known as: ZESTORETIC Take 1 tablet by mouth daily.   Mounjaro 2.5 MG/0.5ML Pen Generic drug: tirzepatide Inject 2.5 mg into the skin once a week. Started by: Felix Pacini, DO   Vitamin D (Ergocalciferol) 1.25 MG (50000 UNIT) Caps capsule Commonly known as: DRISDOL Take 1 capsule (50,000 Units total) by mouth every 7 (seven) days.        All past medical history, surgical history, allergies, family history, immunizations andmedications were updated in the EMR today and reviewed under the history and medication portions of their EMR.     ROS Negative, with the exception of above mentioned in HPI   Objective:  BP 126/80   Pulse (!) 101   Temp (!) 97.5 F (36.4 C)   Ht 5\' 5"   (1.651 m)   Wt (!) 480 lb 3.2 oz (217.8 kg)   SpO2 97%   BMI 79.91 kg/m  Body mass index is 79.91 kg/m. Physical Exam Vitals and nursing note reviewed.  Constitutional:      General: She is not in acute distress.    Appearance: Normal appearance. She is normal weight. She is not  ill-appearing or toxic-appearing.  HENT:     Head: Normocephalic and atraumatic.  Eyes:     General: No scleral icterus.       Right eye: No discharge.        Left eye: No discharge.     Extraocular Movements: Extraocular movements intact.     Conjunctiva/sclera: Conjunctivae normal.     Pupils: Pupils are equal, round, and reactive to light.  Skin:    Findings: No rash.  Neurological:     Mental Status: She is alert and oriented to person, place, and time. Mental status is at baseline.     Motor: No weakness.     Coordination: Coordination normal.     Gait: Gait normal.  Psychiatric:        Mood and Affect: Mood normal.        Behavior: Behavior normal.        Thought Content: Thought content normal.        Judgment: Judgment normal.      No results found. No results found. No results found for this or any previous visit (from the past 24 hour(s)).  Assessment/Plan: Teresa Osborne is a 47 y.o. female present for OV for  Morbid obesity with BMI of 70 and over, adult (HCC)-with serious comorbidity/HTN/Weight loss counseling, encounter for Patient was counseled on exercise, calorie counting, weight loss and potential medications to help with weight loss today. -Patient was provided with online resources for: Weekly net calorie calculator.  Applications for calorie counting.  Patient was advised to ensure she is taking in adequate nutrition daily by meeting calorie goals. -Patient was educated on dietary changes to not only lose weight but to eat healthy.  Patient was educated on glycemic index. -Patient was educated on exercise goal of 150 minutes a week (plus warm up and cool down) of  cardiovascular exercise.  Patient was educated on heart rate for cardiovascular and fat burning zones. -Patient was encouraged to maintain adequate water consumption of at least 100 ounces a day, more if exercising/sweating. We discussed medications today and she would like to try [facility] taper.  Per her insurance this was covered for her. We discussed Wellbutrin and Depade, she would like to wait on this for now and discuss next visit. Follow-up in 4 weeks  Lumbar pain: We discussed referral to physical therapy to treat lumbar pain and teach her proper mechanics for back strengthening.  Reviewed expectations re: course of current medical issues. Discussed self-management of symptoms. Outlined signs and symptoms indicating need for more acute intervention. Patient verbalized understanding and all questions were answered. Patient received an After-Visit Summary.    No orders of the defined types were placed in this encounter.  Meds ordered this encounter  Medications   MOUNJARO 2.5 MG/0.5ML Pen    Sig: Inject 2.5 mg into the skin once a week.    Dispense:  4 mL    Refill:  0   Referral Orders  No referral(s) requested today     Note is dictated utilizing voice recognition software. Although note has been proof read prior to signing, occasional typographical errors still can be missed. If any questions arise, please do not hesitate to call for verification.   electronically signed by:  Felix Pacini, DO   Primary Care - [place]

## 2023-04-23 ENCOUNTER — Other Ambulatory Visit (HOSPITAL_COMMUNITY): Payer: Self-pay

## 2023-04-23 NOTE — Telephone Encounter (Signed)
Pt sent mychart message stating insurance denied coverage. Message sent to provider for review of alternatives provided by patient.

## 2023-04-23 NOTE — Telephone Encounter (Signed)
No further action needed.

## 2023-04-23 NOTE — Telephone Encounter (Signed)
A Prior Authorization was initiated for this patients MOUNJARO 2.5MG   through CoverMyMeds.   Key: [id number]  [employer] is reviewing your PA request. Typically an electronic response will be received within 24-72 hours.  Georga Bora Rx Patient Advocate [phone]

## 2023-04-24 ENCOUNTER — Other Ambulatory Visit: Payer: Self-pay | Admitting: Family Medicine

## 2023-04-24 MED ORDER — WEGOVY 0.25 MG/0.5ML ~~LOC~~ SOAJ: 0.2500 mg | SUBCUTANEOUS | 0 refills | Status: DC

## 2023-04-24 MED ORDER — WEGOVY 0.5 MG/0.5ML ~~LOC~~ SOAJ: 0.5000 mg | SUBCUTANEOUS | 0 refills | Status: DC

## 2023-04-24 NOTE — Telephone Encounter (Signed)
I called and Wegovy first month dose and second month dose.

## 2023-04-28 NOTE — Telephone Encounter (Signed)
No further action needed.

## 2023-04-28 NOTE — Telephone Encounter (Signed)
Please complete PA for Wegovy.

## 2023-04-29 ENCOUNTER — Telehealth: Payer: Self-pay

## 2023-04-29 ENCOUNTER — Other Ambulatory Visit (HOSPITAL_COMMUNITY): Payer: Self-pay

## 2023-04-29 ENCOUNTER — Ambulatory Visit
Admission: RE | Admit: 2023-04-29 | Discharge: 2023-04-29 | Disposition: A | Discharge status: Home / Self Care | Payer: Managed Care, Other (non HMO) | Source: Ambulatory Visit | Attending: Family Medicine | Admitting: Family Medicine

## 2023-04-29 DIAGNOSIS — Z1231 Encounter for screening mammogram for malignant neoplasm of breast: Secondary | ICD-10-CM

## 2023-04-29 NOTE — Telephone Encounter (Signed)
Pharmacy Patient Advocate Encounter   Received notification that prior authorization for [facility] 0.25MG /0.5ML auto-injectors is required/requested.   PA submitted on 04/29/23 to (ins) [employer] via CoverMyMeds Key or (Medicaid) confirmation # [id number] Status is pending

## 2023-04-29 NOTE — Telephone Encounter (Signed)
No further action needed.

## 2023-04-30 ENCOUNTER — Other Ambulatory Visit (HOSPITAL_COMMUNITY): Payer: Self-pay

## 2023-04-30 NOTE — Telephone Encounter (Signed)
Patient Advocate Encounter  Prior Authorization for [employer] 0.25MG /0.5ML auto-injectors has been approved with [employer].    PA# [id number] Effective dates: 04/29/23 through 11/29/23

## 2023-04-30 NOTE — Telephone Encounter (Signed)
My Chart message sent

## 2023-05-08 ENCOUNTER — Ambulatory Visit: Payer: Managed Care, Other (non HMO)

## 2023-05-08 NOTE — Progress Notes (Signed)
Patient came in today for [facility] teaching. It was demonstrated to patient with training pen:   Patient instructed to clean area with alcohol swab in circular motion. Hold pen up to subcutaneous area (preferably back of the arm or stomach) & push pen until a click is heard. When she hears the 2nd click, she should hold pen in place for 10 additional seconds. Then injection is done & she can discard needle in sharps container/milk jug.  Pt administered first dose today. If she has further questions she will call the office.

## 2023-05-20 ENCOUNTER — Ambulatory Visit: Payer: Managed Care, Other (non HMO) | Admitting: Family Medicine

## 2023-05-21 ENCOUNTER — Other Ambulatory Visit: Payer: Self-pay

## 2023-05-22 ENCOUNTER — Other Ambulatory Visit: Payer: Self-pay

## 2023-05-22 NOTE — Telephone Encounter (Signed)
Pharmacy is requesting alternative, PA sent for [facility] and Mounjaro.  PA denied for [facility]. PA approved for [facility].

## 2023-05-28 ENCOUNTER — Encounter: Payer: Self-pay | Admitting: Family Medicine

## 2023-05-28 ENCOUNTER — Ambulatory Visit: Payer: Managed Care, Other (non HMO) | Admitting: Family Medicine

## 2023-05-28 VITALS — BP 134/83 | HR 96 | Temp 98.2°F | Wt >= 6400 oz

## 2023-05-28 DIAGNOSIS — R7309 Other abnormal glucose: Secondary | ICD-10-CM

## 2023-05-28 DIAGNOSIS — Z6841 Body Mass Index (BMI) 40.0 and over, adult: Secondary | ICD-10-CM

## 2023-05-28 DIAGNOSIS — I10 Essential (primary) hypertension: Secondary | ICD-10-CM

## 2023-05-28 DIAGNOSIS — E034 Atrophy of thyroid (acquired): Secondary | ICD-10-CM

## 2023-05-28 MED ORDER — LEVOTHYROXINE SODIUM 75 MCG PO TABS: 75.0000 ug | ORAL_TABLET | Freq: Every day | ORAL | 0 refills | Status: DC

## 2023-05-28 MED ORDER — WEGOVY 1 MG/0.5ML ~~LOC~~ SOAJ: 1.0000 mg | SUBCUTANEOUS | 0 refills | Status: DC

## 2023-05-28 NOTE — Progress Notes (Signed)
Teresa Osborne , [birth year], 47 y.o., female MRN: [id number] Patient Care Team    Relationship Specialty Notifications Start End  Natalia Leatherwood, DO PCP - General Family Medicine  01/09/16   Malen Gauze, MD Consulting Physician Urology  12/12/19     Chief Complaint  Patient presents with   Obesity     Subjective: Teresa Osborne is a 47 y.o. Pt presents for OV to discuss morbid obesity and receive weight loss counseling.   She has difficulty exercising secondary to low back pain.  She reports she knows her low back pain is from her weight.  Last A1c was elevated.  Glucose greater than 126 in the past. Wt/lbs: 480>477 BMI: 79.91>79.48 Exercise: Patient started exercising lightly- by walking. 4x week.  Diet:  - changes: more fruits and veggies, lean meats She has counting calories in the past.   food log 1st appt >  very few vegetables over 2 weeks and mostly complex carbs. Water: getting close to 100 ounces Calories: Does not count Barriers: Meal prepping and lower back pain     05/28/2023    7:42 AM 03/31/2023    9:14 AM 03/12/2022    8:31 AM 09/23/2021    9:16 AM 07/05/2021    8:00 AM  Depression screen PHQ 2/9  Decreased Interest 0 1 3 0 0  Down, Depressed, Hopeless 0 1 2 0 0  PHQ - 2 Score 0 2 5 0 0  Altered sleeping  0 1 0   Tired, decreased energy  1 3 1    Change in appetite  0 1 1   Feeling bad or failure about yourself   1 3 1    Trouble concentrating  0 0 0   Moving slowly or fidgety/restless  0 3 0   Suicidal thoughts  0 0 0   PHQ-9 Score  4 16 3    Difficult doing work/chores  Not difficult at all Very difficult      Allergies  Allergen Reactions   Bactrim [Sulfamethoxazole-Trimethoprim] Rash   Social History   Social History Narrative   Married to [place]. 2 children [name] in [place].   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   Drink caffeinated beverages, wears her seatbelt, wears a bike helmet.   Smoke detector in  the home   Feels safe in her relationships   Past Medical History:  Diagnosis Date   Anxiety    Bilateral swelling of feet    History of kidney stones    Hypertension    Hypothyroid    Joint pain    Kidney problem    Kidney stones    Staghorn   PONV (postoperative nausea and vomiting)    NOV 2018, JAN TREMORS LASTED ABOUT 15 MINUTES, LIKES SCOPOLOMINE PATCH; no issues wuth last kidney stone surgery    Pre-diabetes    Prediabetes    SOB (shortness of breath)    Thyroid disease    Vaginal Pap smear, abnormal    Vitamin D deficiency    Past Surgical History:  Procedure Laterality Date   CYSTOSCOPY  10/27/2017   Procedure: CYSTOSCOPY FLEXIBLE;  Surgeon: Malen Gauze, MD;  Location: [facility];  Service: Urology;;   CYSTOSCOPY WITH RETROGRADE PYELOGRAM, URETEROSCOPY AND STENT PLACEMENT Left 01/21/2018   Procedure: CYSTOSCOPY WITH RETROGRADE PYELOGRAM, URETEROSCOPY AND STENT PLACEMENT;  Surgeon: Malen Gauze, MD;  Location: [facility];  Service: Urology;  Laterality: Left;   HOLMIUM LASER APPLICATION Left 01/21/2018  Procedure: HOLMIUM LASER APPLICATION;  Surgeon: Malen Gauze, MD;  Location: [facility];  Service: Urology;  Laterality: Left;   IR URETERAL STENT LEFT NEW ACCESS W/O SEP NEPHROSTOMY CATH  02/18/2018   NEPHROLITHOTOMY Left 12/07/2017   Procedure: LEFT NEPHROLITHOTOMY PERCUTANEOUS AND STENT PLACEMENT;  Surgeon: Malen Gauze, MD;  Location: [facility];  Service: Urology;  Laterality: Left;   NEPHROLITHOTOMY Left 02/19/2018   Procedure: NEPHROLITHOTOMY PERCUTANEOUS;  Surgeon: Malen Gauze, MD;  Location: [facility];  Service: Urology;  Laterality: Left;   NEPHROLITHOTOMY Left 02/25/2018   Procedure: NEPHROLITHOTOMY PERCUTANEOUS SECOND LOOK;  Surgeon: Malen Gauze, MD;  Location[facility];  Service: Urology;  Laterality: Left;   NEPHROSTOMY Left 10/27/2017   Procedure: NEPHROSTOMY TUBE PLACEMENT;  Surgeon: Malen Gauze, MD;  Location[facility];  Service:  Urology;  Laterality: Left;   Family History  Problem Relation Age of Onset   Anxiety disorder Mother    Obesity Mother    Obesity Father    Alcohol abuse Father    Heart disease Father    Hypertension Father    Arthritis Father    Mental retardation Father    Sudden death Father    Drug abuse Father    Cancer Sister 47       ovarian   Arthritis Maternal Grandmother    COPD Maternal Grandfather    Heart disease Maternal Grandfather    Cancer Paternal Grandmother        unknown, [place]   Arthritis Paternal Grandmother    COPD Paternal Grandmother    Early death Paternal Grandmother    Cancer Paternal Grandfather        pancreatic   Alcohol abuse Paternal Grandfather    Arthritis Paternal Grandfather    COPD Paternal Grandfather    Early death Paternal Grandfather    Allergies as of 05/28/2023       Reactions   Bactrim [sulfamethoxazole-trimethoprim] Rash        Medication List        Accurate as of May 28, 2023  1:38 PM. If you have any questions, ask your nurse or doctor.          STOP taking these medications    Wegovy 0.25 MG/0.5ML Soaj Generic drug: Semaglutide-Weight Management Replaced by: Wegovy 1 MG/0.5ML Soaj You also have another medication with the same name that you need to continue taking as instructed. Stopped by: Felix Pacini, DO       TAKE these medications    levothyroxine 75 MCG tablet Commonly known as: SYNTHROID Take 1 tablet (75 mcg total) by mouth daily. What changed:  medication strength how much to take Changed by: Felix Pacini, DO   lisinopril-hydrochlorothiazide 10-12.5 MG tablet Commonly known as: ZESTORETIC Take 1 tablet by mouth daily.   Vitamin D (Ergocalciferol) 1.25 MG (50000 UNIT) Caps capsule Commonly known as: DRISDOL Take 1 capsule (50,000 Units total) by mouth every 7 (seven) days.   Wegovy 0.5 MG/0.5ML Soaj Generic drug: Semaglutide-Weight Management Inject 0.5 mg into the skin once a week. What  changed:  Another medication with the same name was added. Make sure you understand how and when to take each. Another medication with the same name was removed. Continue taking this medication, and follow the directions you see here. Changed by: Felix Pacini, DO   MJ.Hock 1 MG/0.5ML Soaj Generic drug: Semaglutide-Weight Management Inject 1 mg into the skin once a week. What changed: You were already taking a medication with the  same name, and this prescription was added. Make sure you understand how and when to take each. Replaces: Wegovy 0.25 MG/0.5ML Soaj Changed by: Felix Pacini, DO        All past medical history, surgical history, allergies, family history, immunizations andmedications were updated in the EMR today and reviewed under the history and medication portions of their EMR.     ROS Negative, with the exception of above mentioned in HPI   Objective:  BP 134/83   Pulse 96   Temp 98.2 F (36.8 C)   Wt (!) 477 lb 9.6 oz (216.6 kg)   SpO2 95%   BMI 79.48 kg/m  Body mass index is 79.48 kg/m. Physical Exam Vitals and nursing note reviewed.  Constitutional:      General: She is not in acute distress.    Appearance: Normal appearance. She is obese. She is not ill-appearing or toxic-appearing.  HENT:     Head: Normocephalic and atraumatic.  Eyes:     General: No scleral icterus.       Right eye: No discharge.        Left eye: No discharge.     Extraocular Movements: Extraocular movements intact.     Conjunctiva/sclera: Conjunctivae normal.     Pupils: Pupils are equal, round, and reactive to light.  Cardiovascular:     Rate and Rhythm: Normal rate and regular rhythm.  Pulmonary:     Effort: Pulmonary effort is normal.  Neurological:     Mental Status: She is alert and oriented to person, place, and time. Mental status is at baseline.     Motor: No weakness.     Coordination: Coordination normal.     Gait: Gait normal.  Psychiatric:        Mood and Affect:  Mood normal.        Behavior: Behavior normal.        Thought Content: Thought content normal.        Judgment: Judgment normal.     No results found. No results found. No results found for this or any previous visit (from the past 24 hour(s)).  Assessment/Plan: Teresa Osborne is a 47 y.o. female present for OV for  Morbid obesity with BMI of 70 and over, adult (HCC)-with serious comorbidity/HTN/Weight loss counseling, encounter for #2 Patient has been counseled on exercise, calorie counting, weight loss and potential medications to help with weight loss today. -Patient has been provided with online resources for: Weekly net calorie calculator.  Applications for calorie counting.  Patient was advised to ensure she is taking in adequate nutrition daily by meeting calorie goals. -Patient has been educated on dietary changes to not only lose weight but to eat healthy.  -  Patient was educated on glycemic index. -Patient has been educated on exercise goal of 150 minutes a week (plus warm up and cool down) of cardiovascular exercise.  Patient was educated on heart rate for cardiovascular and fat burning zones. -Patient has been encouraged to maintain adequate water consumption of at least 100 ounces a day, more if exercising/sweating. Tolerating start of Wegovy. Increase Wegovy to next dose 0.5mg  next week, and Wegovy 1 mg called in to continue taper thereafter. We discussed Wellbutrin and Depade,she would like to wait on this for now and discuss next visit. Follow-up in 4-6 weeks  Lumbar pain: We discussed referral to physical therapy to treat lumbar pain and teach her proper mechanics for back strengthening PT- encouraged to start for at least a couple  weeks, then can perform at home.   Reviewed expectations re: course of current medical issues. Discussed self-management of symptoms. Outlined signs and symptoms indicating need for more acute intervention. Patient verbalized  understanding and all questions were answered. Patient received an After-Visit Summary.    No orders of the defined types were placed in this encounter.  Meds ordered this encounter  Medications   WEGOVY 1 MG/0.5ML SOAJ    Sig: Inject 1 mg into the skin once a week.    Dispense:  2 mL    Refill:  0   levothyroxine (SYNTHROID) 75 MCG tablet    Sig: Take 1 tablet (75 mcg total) by mouth daily.    Dispense:  90 tablet    Refill:  0   Referral Orders  No referral(s) requested today     Note is dictated utilizing voice recognition software. Although note has been proof read prior to signing, occasional typographical errors still can be missed. If any questions arise, please do not hesitate to call for verification.   electronically signed by:  Felix Pacini, DO  [facility] - [place]

## 2023-05-28 NOTE — Patient Instructions (Addendum)
-  Increase Synthroid to 75 mcg a day.  New prescription provided.  We will retest your thyroid levels at next follow-up visit with a goal of a TSH closer to 1.0 -Continue Wegovy taper, Wegovy 0.5 mg already at pharmacy, [facility] 1 mg will be called in after your appointment today. -Follow-up in 5-6 weeks  Goal:increase exercise routine and start PT and aerobics.   Return in about 5 weeks (around 07/03/2023) for weight.        Great to see you today.

## 2023-05-31 ENCOUNTER — Encounter: Payer: Self-pay | Admitting: Family Medicine

## 2023-06-01 NOTE — Telephone Encounter (Signed)
No further action needed.

## 2023-07-02 ENCOUNTER — Ambulatory Visit: Payer: Managed Care, Other (non HMO) | Admitting: Family Medicine

## 2023-07-06 ENCOUNTER — Ambulatory Visit: Payer: Managed Care, Other (non HMO) | Admitting: Family Medicine

## 2023-07-15 ENCOUNTER — Other Ambulatory Visit: Payer: Self-pay | Admitting: Family Medicine

## 2023-07-15 ENCOUNTER — Ambulatory Visit: Payer: Managed Care, Other (non HMO) | Admitting: Family Medicine

## 2023-07-15 ENCOUNTER — Encounter: Payer: Self-pay | Admitting: Family Medicine

## 2023-07-15 VITALS — BP 102/68 | HR 90 | Temp 98.3°F | Wt >= 6400 oz

## 2023-07-15 DIAGNOSIS — R7309 Other abnormal glucose: Secondary | ICD-10-CM

## 2023-07-15 DIAGNOSIS — Z6841 Body Mass Index (BMI) 40.0 and over, adult: Secondary | ICD-10-CM

## 2023-07-15 DIAGNOSIS — E034 Atrophy of thyroid (acquired): Secondary | ICD-10-CM

## 2023-07-15 DIAGNOSIS — I10 Essential (primary) hypertension: Secondary | ICD-10-CM

## 2023-07-15 DIAGNOSIS — F419 Anxiety disorder, unspecified: Secondary | ICD-10-CM | POA: Diagnosis not present

## 2023-07-15 LAB — T4, FREE: Free T4: 1.06 ng/dL (ref 0.60–1.60)

## 2023-07-15 LAB — T3, FREE: T3, Free: 2.8 pg/mL (ref 2.3–4.2)

## 2023-07-15 LAB — TSH: TSH: 2.14 u[IU]/mL (ref 0.35–5.50)

## 2023-07-15 MED ORDER — WEGOVY 2.4 MG/0.75ML ~~LOC~~ SOAJ: 2.4000 mg | SUBCUTANEOUS | 2 refills | Status: DC

## 2023-07-15 MED ORDER — BUPROPION HCL ER (SR) 150 MG PO TB12: 150.0000 mg | ORAL_TABLET | Freq: Two times a day (BID) | ORAL | 1 refills | Status: DC

## 2023-07-15 MED ORDER — WEGOVY 1.7 MG/0.75ML ~~LOC~~ SOAJ: 1.7000 mg | SUBCUTANEOUS | 0 refills | Status: DC

## 2023-07-15 NOTE — Addendum Note (Signed)
Addended by: Leonie Douglas on: 07/15/2023 10:35 AM   Modules accepted: Orders

## 2023-07-15 NOTE — Patient Instructions (Addendum)
Return in about 7 weeks (around 09/02/2023) for weight counseling/obesity.        Great to see you today.  I have refilled the medication(s) we provide.   If labs were collected or images ordered, we will inform you of  results once we have received them and reviewed. We will contact you either by echart message, or telephone call.  Please give ample time to the testing facility, and our office to run,  receive and review results. Please do not call inquiring of results, even if you can see them in your chart. We will contact you as soon as we are able. If it has been over 1 week since the test was completed, and you have not yet heard from [place], then please call us.    - echart message- for normal results that have been seen by the patient already.   - telephone call: abnormal results or if patient has not viewed results in their echart.  If a referral to a specialist was entered for you, please call us in 2 weeks if you have not heard from the specialist office to schedule.

## 2023-07-15 NOTE — Progress Notes (Signed)
Teresa Osborne , 1976-07-17, 47 y.o., female MRN: [id number] Patient Care Team    Relationship Specialty Notifications Start End  Natalia Leatherwood, DO PCP - General Family Medicine  01/09/16   Malen Gauze, MD Consulting Physician Urology  12/12/19     Chief Complaint  Patient presents with   Obesity     Subjective: Teresa Osborne is a 47 y.o. Pt presents for OV to discuss morbid obesity and receive weight loss counseling.   She has difficulty exercising secondary to low back pain.  She reports she knows her low back pain is from her weight.  Last A1c was elevated.  Glucose greater than 126 in the past. Wt/lbs: 480>477> 467 BMI: 79.91>79.48> 77.81 Exercise: Patient started exercising -walking in neighborhood. 4x week.(20 min)  Diet:  - changes: more fruits and veggies, lean meats food log 1st appt >  very few vegetables over 2 weeks and mostly complex carbs. Water: getting close to 100 ounces (90 ounces now) Calories: Does not count- she thinks 1200-1500 Barriers: Meal prepping and lower back pain     07/15/2023    7:44 AM 05/28/2023    7:42 AM 03/31/2023    9:14 AM 03/12/2022    8:31 AM 09/23/2021    9:16 AM  Depression screen PHQ 2/9  Decreased Interest 1 0 1 3 0  Down, Depressed, Hopeless 1 0 1 2 0  PHQ - 2 Score 2 0 2 5 0  Altered sleeping 1  0 1 0  Tired, decreased energy 1  1 3 1   Change in appetite 1  0 1 1  Feeling bad or failure about yourself  1  1 3 1   Trouble concentrating 0  0 0 0  Moving slowly or fidgety/restless 0  0 3 0  Suicidal thoughts 0  0 0 0  PHQ-9 Score 6  4 16 3   Difficult doing work/chores Not difficult at all  Not difficult at all Very difficult     Allergies  Allergen Reactions   Bactrim [Sulfamethoxazole-Trimethoprim] Rash   Social History   Social History Narrative   Married to [place]. 2 children [name] in [place].   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   Drink caffeinated beverages, wears her  seatbelt, wears a bike helmet.   Smoke detector in the home   Feels safe in her relationships   Past Medical History:  Diagnosis Date   Anxiety    Bilateral swelling of feet    History of kidney stones    Hypertension    Hypothyroid    Joint pain    Kidney problem    Kidney stones    Staghorn   PONV (postoperative nausea and vomiting)    NOV 2018, JAN TREMORS LASTED ABOUT 15 MINUTES, LIKES SCOPOLOMINE PATCH; no issues wuth last kidney stone surgery    Pre-diabetes    Prediabetes    SOB (shortness of breath)    Thyroid disease    Vaginal Pap smear, abnormal    Vitamin D deficiency    Past Surgical History:  Procedure Laterality Date   CYSTOSCOPY  10/27/2017   Procedure: CYSTOSCOPY FLEXIBLE;  Surgeon: Malen Gauze, MD;  Location: [facility];  Service: Urology;;   CYSTOSCOPY WITH RETROGRADE PYELOGRAM, URETEROSCOPY AND STENT PLACEMENT Left 01/21/2018   Procedure: CYSTOSCOPY WITH RETROGRADE PYELOGRAM, URETEROSCOPY AND STENT PLACEMENT;  Surgeon: Malen Gauze, MD;  Location: [facility];  Service: Urology;  Laterality: Left;   HOLMIUM LASER APPLICATION  Left 01/21/2018   Procedure: HOLMIUM LASER APPLICATION;  Surgeon: Malen Gauze, MD;  Location: [facility];  Service: Urology;  Laterality: Left;   IR URETERAL STENT LEFT NEW ACCESS W/O SEP NEPHROSTOMY CATH  02/18/2018   NEPHROLITHOTOMY Left 12/07/2017   Procedure: LEFT NEPHROLITHOTOMY PERCUTANEOUS AND STENT PLACEMENT;  Surgeon: Malen Gauze, MD;  Location: [facility];  Service: Urology;  Laterality: Left;   NEPHROLITHOTOMY Left 02/19/2018   Procedure: NEPHROLITHOTOMY PERCUTANEOUS;  Surgeon: Malen Gauze, MD;  Location: [facility];  Service: Urology;  Laterality: Left;   NEPHROLITHOTOMY Left 02/25/2018   Procedure: NEPHROLITHOTOMY PERCUTANEOUS SECOND LOOK;  Surgeon: Malen Gauze, MD;  Location: [facility];  Service: Urology;  Laterality: Left;   NEPHROSTOMY Left 10/27/2017   Procedure: NEPHROSTOMY TUBE PLACEMENT;  Surgeon:  Malen Gauze, MD;  Location: [facility];  Service: Urology;  Laterality: Left;   Family History  Problem Relation Age of Onset   Anxiety disorder Mother    Obesity Mother    Obesity Father    Alcohol abuse Father    Heart disease Father    Hypertension Father    Arthritis Father    Mental retardation Father    Sudden death Father    Drug abuse Father    Cancer Sister 52       ovarian   Arthritis Maternal Grandmother    COPD Maternal Grandfather    Heart disease Maternal Grandfather    Cancer Paternal Grandmother        unknown, [place]   Arthritis Paternal Grandmother    COPD Paternal Grandmother    Early death Paternal Grandmother    Cancer Paternal Grandfather        pancreatic   Alcohol abuse Paternal Grandfather    Arthritis Paternal Grandfather    COPD Paternal Grandfather    Early death Paternal Grandfather    Allergies as of 07/15/2023       Reactions   Bactrim [sulfamethoxazole-trimethoprim] Rash        Medication List        Accurate as of July 15, 2023  8:06 AM. If you have any questions, ask your nurse or doctor.          STOP taking these medications    Wegovy 0.5 MG/0.5ML Soaj Generic drug: Semaglutide-Weight Management Replaced by: Wegovy 1.7 MG/0.75ML Soaj You also have another medication with the same name that you need to continue taking as instructed. Stopped by: Felix Pacini       TAKE these medications    buPROPion 150 MG 12 hr tablet Commonly known as: Wellbutrin SR Take 1 tablet (150 mg total) by mouth 2 (two) times daily. Started by: Felix Pacini   levothyroxine 75 MCG tablet Commonly known as: SYNTHROID Take 1 tablet (75 mcg total) by mouth daily.   lisinopril-hydrochlorothiazide 10-12.5 MG tablet Commonly known as: ZESTORETIC Take 1 tablet by mouth daily.   Vitamin D (Ergocalciferol) 1.25 MG (50000 UNIT) Caps capsule Commonly known as: DRISDOL Take 1 capsule (50,000 Units total) by mouth every 7 (seven) days.    Wegovy 1 MG/0.5ML Soaj Generic drug: Semaglutide-Weight Management Inject 1 mg into the skin once a week. What changed:  Another medication with the same name was added. Make sure you understand how and when to take each. Another medication with the same name was removed. Continue taking this medication, and follow the directions you see here. Changed by: Felix Pacini   Wegovy 1.7 MG/0.75ML Soaj Generic drug: Semaglutide-Weight Management Inject 1.7  mg into the skin once a week. What changed: You were already taking a medication with the same name, and this prescription was added. Make sure you understand how and when to take each. Replaces: Wegovy 0.5 MG/0.5ML Soaj Changed by: Felix Pacini   Wegovy 2.4 MG/0.75ML Soaj Generic drug: Semaglutide-Weight Management Inject 2.4 mg into the skin once a week. Start taking on: August 12, 2023 What changed: You were already taking a medication with the same name, and this prescription was added. Make sure you understand how and when to take each. Changed by: Felix Pacini        All past medical history, surgical history, allergies, family history, immunizations andmedications were updated in the EMR today and reviewed under the history and medication portions of their EMR.     ROS Negative, with the exception of above mentioned in HPI   Objective:  BP 102/68   Pulse 90   Temp 98.3 F (36.8 C)   Wt (!) 467 lb 9.6 oz (212.1 kg)   SpO2 96%   BMI 77.81 kg/m  Body mass index is 77.81 kg/m. Physical Exam Vitals and nursing note reviewed.  Constitutional:      General: She is not in acute distress.    Appearance: Normal appearance. She is normal weight. She is not ill-appearing or toxic-appearing.  HENT:     Head: Normocephalic and atraumatic.  Eyes:     General: No scleral icterus.       Right eye: No discharge.        Left eye: No discharge.     Extraocular Movements: Extraocular movements intact.     Conjunctiva/sclera:  Conjunctivae normal.     Pupils: Pupils are equal, round, and reactive to light.  Skin:    Findings: No rash.  Neurological:     Mental Status: She is alert and oriented to person, place, and time. Mental status is at baseline.     Motor: No weakness.     Coordination: Coordination normal.     Gait: Gait normal.  Psychiatric:        Mood and Affect: Mood normal.        Behavior: Behavior normal.        Thought Content: Thought content normal.        Judgment: Judgment normal.     No results found. No results found. No results found for this or any previous visit (from the past 24 hour(s)).  Assessment/Plan: Teresa Osborne is a 47 y.o. female present for OV for  Morbid obesity with BMI of 70 and over, adult (HCC)-with serious comorbidity/HTN/Weight loss counseling, encounter for #3 Patient has been counseled on exercise, calorie counting, weight loss and potential medications to help with weight loss today. -Patient has been provided with online resources for: Weekly net calorie calculator.  Applications for calorie counting.  Patient was advised to ensure she is taking in adequate nutrition daily by meeting calorie goals. -Patient has been educated on dietary changes to not only lose weight but to eat healthy.  -  Patient was educated on glycemic index. -Patient has been educated on exercise goal of 150 minutes a week (plus warm up and cool down) of cardiovascular exercise.  Patient was educated on heart rate for cardiovascular and fat burning zones. -Patient has been encouraged to maintain adequate water consumption of at least 100 ounces a day, more if exercising/sweating.  - up to 80-90 ounces Tolerating  Wegovy  -  Increase Wegovy 1 mg >  1.7 weekly x4, then 2.4 mg weekly Start Wellbutrin  for cravings and increased anxiety - Depade,she would like to wait on this for now Referral to PT was placed for back pain and strengthening to help start her exercise program correctly   Exercise- by next visit increase to 5, 30 min sessions.  Follow-up in 4-6 weeks   Hypothyroidism due to acquired atrophy of thyroid Increased levo  last visit to 75 mcg every day, in attempt to reach goal of 1.  - TSH - T3, free - T4, free   Reviewed expectations re: course of current medical issues. Discussed self-management of symptoms. Outlined signs and symptoms indicating need for more acute intervention. Patient verbalized understanding and all questions were answered. Patient received an After-Visit Summary.    Orders Placed This Encounter  Procedures   TSH   T3, free   T4, free   Meds ordered this encounter  Medications   WEGOVY 1.7 MG/0.75ML SOAJ    Sig: Inject 1.7 mg into the skin once a week.    Dispense:  3 mL    Refill:  0   WEGOVY 2.4 MG/0.75ML SOAJ    Sig: Inject 2.4 mg into the skin once a week.    Dispense:  3 mL    Refill:  2   buPROPion (WELLBUTRIN SR) 150 MG 12 hr tablet    Sig: Take 1 tablet (150 mg total) by mouth 2 (two) times daily.    Dispense:  180 tablet    Refill:  1   Referral Orders  No referral(s) requested today     Note is dictated utilizing voice recognition software. Although note has been proof read prior to signing, occasional typographical errors still can be missed. If any questions arise, please do not hesitate to call for verification.   electronically signed by:  Felix Pacini, DO  [facility] - [place]

## 2023-07-16 ENCOUNTER — Other Ambulatory Visit: Payer: Self-pay | Admitting: Family Medicine

## 2023-07-16 MED ORDER — LEVOTHYROXINE SODIUM 75 MCG PO TABS: ORAL_TABLET | ORAL | 3 refills | Status: DC

## 2023-09-02 ENCOUNTER — Ambulatory Visit: Payer: Managed Care, Other (non HMO) | Admitting: Family Medicine

## 2023-09-17 ENCOUNTER — Ambulatory Visit: Payer: Managed Care, Other (non HMO) | Admitting: Family Medicine

## 2023-09-29 ENCOUNTER — Ambulatory Visit: Payer: Managed Care, Other (non HMO) | Admitting: Family Medicine

## 2023-09-30 ENCOUNTER — Ambulatory Visit: Payer: Managed Care, Other (non HMO) | Admitting: Family Medicine

## 2023-09-30 ENCOUNTER — Encounter: Payer: Self-pay | Admitting: Family Medicine

## 2023-09-30 VITALS — BP 132/82 | HR 86 | Temp 97.6°F | Wt >= 6400 oz

## 2023-09-30 DIAGNOSIS — I10 Essential (primary) hypertension: Secondary | ICD-10-CM

## 2023-09-30 DIAGNOSIS — R7309 Other abnormal glucose: Secondary | ICD-10-CM

## 2023-09-30 DIAGNOSIS — E034 Atrophy of thyroid (acquired): Secondary | ICD-10-CM

## 2023-09-30 DIAGNOSIS — Z6841 Body Mass Index (BMI) 40.0 and over, adult: Secondary | ICD-10-CM

## 2023-09-30 MED ORDER — BUPROPION HCL ER (XL) 150 MG PO TB24: 150.0000 mg | ORAL_TABLET | Freq: Every day | ORAL | 1 refills | Status: DC

## 2023-09-30 MED ORDER — WEGOVY 2.4 MG/0.75ML ~~LOC~~ SOAJ: 2.4000 mg | SUBCUTANEOUS | 5 refills | Status: DC

## 2023-09-30 MED ORDER — LISINOPRIL-HYDROCHLOROTHIAZIDE 10-12.5 MG PO TABS: 1.0000 | ORAL_TABLET | Freq: Every day | ORAL | 1 refills | Status: DC

## 2023-09-30 NOTE — Progress Notes (Signed)
Teresa Osborne , 12/20/1975, 47 y.o., female MRN: [id number] Patient Care Team    Relationship Specialty Notifications Start End  Natalia Leatherwood, DO PCP - General Family Medicine  01/09/16   Malen Gauze, MD Consulting Physician Urology  12/12/19     Chief Complaint  Patient presents with   Obesity     Subjective: Teresa Osborne is a 47 y.o. Pt presents for OV to discuss morbid obesity and receive weight loss counseling.   She has difficulty exercising secondary to low back pain.  She reports she knows her low back pain is from her weight.  A1c was elevated.  Glucose greater than 126 in the past. Wt/lbs: 480>477> 467> 457 BMI: 79.91>79.48> 77.81> 76.18 Exercise: Patient started exercising -walking in neighborhood. It is getting easier for her now!!! Diet:  - changes: more fruits and veggies, lean meats food log 1st appt >  very few vegetables over 2 weeks and mostly complex carbs. Water: getting close to 100 ounces (90 ounces now) Calories: Does not count- she thinks 1200-1500 Barriers: Meal prepping and lower back pain  HTN: Pt reports compliance with lisinopril-HCTZ . Patient denies chest pain, shortness of breath, dizziness or lower extremity edema.  RF: HTN, morbid obesity, HD fhx     09/30/2023    7:47 AM 07/15/2023    7:44 AM 05/28/2023    7:42 AM 03/31/2023    9:14 AM 03/12/2022    8:31 AM  Depression screen PHQ 2/9  Decreased Interest 0 1 0 1 3  Down, Depressed, Hopeless 1 1 0 1 2  PHQ - 2 Score 1 2 0 2 5  Altered sleeping  1  0 1  Tired, decreased energy  1  1 3   Change in appetite  1  0 1  Feeling bad or failure about yourself   1  1 3   Trouble concentrating  0  0 0  Moving slowly or fidgety/restless  0  0 3  Suicidal thoughts  0  0 0  PHQ-9 Score  6  4 16   Difficult doing work/chores  Not difficult at all  Not difficult at all Very difficult    Allergies  Allergen Reactions   Bactrim [Sulfamethoxazole-Trimethoprim] Rash   Social  History   Social History Narrative   Married to [place]. 2 children [name] in [place].   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   Drink caffeinated beverages, wears her seatbelt, wears a bike helmet.   Smoke detector in the home   Feels safe in her relationships   Past Medical History:  Diagnosis Date   Anxiety    Bilateral swelling of feet    History of kidney stones    Hypertension    Hypothyroid    Joint pain    Kidney problem    Kidney stones    [place]   PONV (postoperative nausea and vomiting)    NOV 2018, JAN TREMORS LASTED ABOUT 15 MINUTES, LIKES SCOPOLOMINE PATCH; no issues wuth last kidney stone surgery    Pre-diabetes    Prediabetes    SOB (shortness of breath)    Thyroid disease    Vaginal Pap smear, abnormal    Vitamin D deficiency    Past Surgical History:  Procedure Laterality Date   CYSTOSCOPY  10/27/2017   Procedure: CYSTOSCOPY FLEXIBLE;  Surgeon: Malen Gauze, MD;  Location: [facility];  Service: Urology;;   CYSTOSCOPY WITH RETROGRADE PYELOGRAM, URETEROSCOPY AND STENT PLACEMENT Left 01/21/2018  Procedure: CYSTOSCOPY WITH RETROGRADE PYELOGRAM, URETEROSCOPY AND STENT PLACEMENT;  Surgeon: Malen Gauze, MD;  Location: [facility];  Service: Urology;  Laterality: Left;   HOLMIUM LASER APPLICATION Left 01/21/2018   Procedure: HOLMIUM LASER APPLICATION;  Surgeon: Malen Gauze, MD;  Location: [facility];  Service: Urology;  Laterality: Left;   IR URETERAL STENT LEFT NEW ACCESS W/O SEP NEPHROSTOMY CATH  02/18/2018   NEPHROLITHOTOMY Left 12/07/2017   Procedure: LEFT NEPHROLITHOTOMY PERCUTANEOUS AND STENT PLACEMENT;  Surgeon: Malen Gauze, MD;  Location: [facility];  Service: Urology;  Laterality: Left;   NEPHROLITHOTOMY Left 02/19/2018   Procedure: NEPHROLITHOTOMY PERCUTANEOUS;  Surgeon: Malen Gauze, MD;  Location: [facility];  Service: Urology;  Laterality: Left;   NEPHROLITHOTOMY Left 02/25/2018   Procedure: NEPHROLITHOTOMY  PERCUTANEOUS SECOND LOOK;  Surgeon: Malen Gauze, MD;  Location: [facility];  Service: Urology;  Laterality: Left;   NEPHROSTOMY Left 10/27/2017   Procedure: NEPHROSTOMY TUBE PLACEMENT;  Surgeon: Malen Gauze, MD;  Location: [facility];  Service: Urology;  Laterality: Left;   Family History  Problem Relation Age of Onset   Anxiety disorder Mother    Obesity Mother    Obesity Father    Alcohol abuse Father    Heart disease Father    Hypertension Father    Arthritis Father    Mental retardation Father    Sudden death Father    Drug abuse Father    Cancer Sister 73       ovarian   Arthritis Maternal Grandmother    COPD Maternal Grandfather    Heart disease Maternal Grandfather    Cancer Paternal Grandmother        unknown, [place]   Arthritis Paternal Grandmother    COPD Paternal Grandmother    Early death Paternal Grandmother    Cancer Paternal Grandfather        pancreatic   Alcohol abuse Paternal Grandfather    Arthritis Paternal Grandfather    COPD Paternal Grandfather    Early death Paternal Grandfather    Allergies as of 09/30/2023       Reactions   Bactrim [sulfamethoxazole-trimethoprim] Rash        Medication List        Accurate as of September 30, 2023  8:35 AM. If you have any questions, ask your nurse or doctor.          STOP taking these medications    buPROPion 150 MG 12 hr tablet Commonly known as: Wellbutrin SR Replaced by: buPROPion 150 MG 24 hr tablet       TAKE these medications    buPROPion 150 MG 24 hr tablet Commonly known as: WELLBUTRIN XL Take 1 tablet (150 mg total) by mouth daily. Replaces: buPROPion 150 MG 12 hr tablet   levothyroxine 75 MCG tablet Commonly known as: SYNTHROID 1 tab po 6 days a week, and 1.5 tabs 1 day a week   lisinopril-hydrochlorothiazide 10-12.5 MG tablet Commonly known as: ZESTORETIC Take 1 tablet by mouth daily.   Vitamin D (Ergocalciferol) 1.25 MG (50000 UNIT) Caps capsule Commonly known as:  DRISDOL Take 1 capsule (50,000 Units total) by mouth every 7 (seven) days.   Wegovy 2.4 MG/0.75ML Soaj Generic drug: Semaglutide-Weight Management Inject 2.4 mg into the skin once a week. What changed: Another medication with the same name was removed. Continue taking this medication, and follow the directions you see here.        All past medical history, surgical history, allergies, family history,  immunizations andmedications were updated in the EMR today and reviewed under the history and medication portions of their EMR.     ROS Negative, with the exception of above mentioned in HPI   Objective:  BP 132/82   Pulse 86   Temp 97.6 F (36.4 C)   Wt (!) 457 lb 12.8 oz (207.7 kg)   SpO2 95%   BMI 76.18 kg/m  Body mass index is 76.18 kg/m. Physical Exam Vitals and nursing note reviewed.  Constitutional:      General: She is not in acute distress.    Appearance: Normal appearance. She is normal weight. She is not ill-appearing or toxic-appearing.  HENT:     Head: Normocephalic and atraumatic.  Eyes:     General: No scleral icterus.       Right eye: No discharge.        Left eye: No discharge.     Extraocular Movements: Extraocular movements intact.     Conjunctiva/sclera: Conjunctivae normal.     Pupils: Pupils are equal, round, and reactive to light.  Skin:    Findings: No rash.  Neurological:     Mental Status: She is alert and oriented to person, place, and time. Mental status is at baseline.     Motor: No weakness.     Coordination: Coordination normal.     Gait: Gait normal.  Psychiatric:        Mood and Affect: Mood normal.        Behavior: Behavior normal.        Thought Content: Thought content normal.        Judgment: Judgment normal.     No results found. No results found. No results found for this or any previous visit (from the past 24 hour(s)).  Assessment/Plan: Terah Giaimo is a 47 y.o. female present for OV for  Morbid obesity with BMI  of 70 and over, adult (HCC)-with serious comorbidity/HTN/Weight loss counseling, encounter for #4 Patient has been counseled on exercise, calorie counting, weight loss and potential medications to help with weight loss today. -Patient has been provided with online resources for: Weekly net calorie calculator.  Applications for calorie counting.  Patient was advised to ensure she is taking in adequate nutrition daily by meeting calorie goals. -Patient has been educated on dietary changes to not only lose weight but to eat healthy.  -  Patient was educated on glycemic index. -Patient has been educated on exercise goal of 150 minutes a week (plus warm up and cool down) of cardiovascular exercise.  Patient was educated on heart rate for cardiovascular and fat burning zones. -Patient has been encouraged to maintain adequate water consumption of at least 100 ounces a day, more if exercising/sweating.  - up to 80-90 ounces Tolerating  Wegovy  -  continue Wegovy 2.4 mg weekly- tolerating START (issue with insurance last time)Wellbutrin  for cravings and increased anxiety - Depade,she would like to wait on this for now Referral to PT was placed for back pain and strengthening to help start her exercise program correctly  Exercise- by next visit increase to 5, 30 min sessions.  Follow-up in 15 weeks   HTN/Morbid obesity with BMI of 70 and over, adult (HCC) Stable Continue  lisinopril-hctz 10-12.5 mg qd - increase exercise - low sodium diet.    Reviewed expectations re: course of current medical issues. Discussed self-management of symptoms. Outlined signs and symptoms indicating need for more acute intervention. Patient verbalized understanding and all questions were answered.  Patient received an After-Visit Summary.    No orders of the defined types were placed in this encounter.  Meds ordered this encounter  Medications   WEGOVY 2.4 MG/0.75ML SOAJ    Sig: Inject 2.4 mg into the skin  once a week.    Dispense:  3 mL    Refill:  5   lisinopril-hydrochlorothiazide (ZESTORETIC) 10-12.5 MG tablet    Sig: Take 1 tablet by mouth daily.    Dispense:  90 tablet    Refill:  1   buPROPion (WELLBUTRIN XL) 150 MG 24 hr tablet    Sig: Take 1 tablet (150 mg total) by mouth daily.    Dispense:  90 tablet    Refill:  1   Referral Orders  No referral(s) requested today     Note is dictated utilizing voice recognition software. Although note has been proof read prior to signing, occasional typographical errors still can be missed. If any questions arise, please do not hesitate to call for verification.   electronically signed by:  Felix Pacini, DO  [facility] - [place]

## 2023-09-30 NOTE — Patient Instructions (Addendum)
Return in about 15 weeks (around 01/13/2024) for Routine chronic condition follow-up.   Vitals - 1 value per visit     Weight (lb) Height BMI  03/31/2023 483.4 (H)  5' 5.35" (1.66 m)  79.57 kg/m2   04/22/2023 480.2 (H)  5\' 5"  (1.651 m)  79.91 kg/m2   05/28/2023 477.6 (H)   79.48 kg/m2   07/15/2023 467.6 (H)   77.81 kg/m2   09/30/2023 457.8 (H)   76.18 kg/m2           Great to see you today.  I have refilled the medication(s) we provide.   If labs were collected or images ordered, we will inform you of  results once we have received them and reviewed. We will contact you either by echart message, or telephone call.  Please give ample time to the testing facility, and our office to run,  receive and review results. Please do not call inquiring of results, even if you can see them in your chart. We will contact you as soon as we are able. If it has been over 1 week since the test was completed, and you have not yet heard from [place], then please call us.    - echart message- for normal results that have been seen by the patient already.   - telephone call: abnormal results or if patient has not viewed results in their echart.  If a referral to a specialist was entered for you, please call us in 2 weeks if you have not heard from the specialist office to schedule.

## 2023-11-02 ENCOUNTER — Other Ambulatory Visit (HOSPITAL_COMMUNITY): Payer: Self-pay

## 2023-11-18 ENCOUNTER — Other Ambulatory Visit (HOSPITAL_COMMUNITY): Payer: Self-pay

## 2023-12-04 ENCOUNTER — Other Ambulatory Visit (HOSPITAL_COMMUNITY): Payer: Self-pay

## 2023-12-28 ENCOUNTER — Other Ambulatory Visit (HOSPITAL_COMMUNITY): Payer: Self-pay

## 2023-12-30 ENCOUNTER — Encounter: Payer: Self-pay | Admitting: Family Medicine

## 2024-01-04 ENCOUNTER — Telehealth: Payer: Self-pay

## 2024-01-04 ENCOUNTER — Other Ambulatory Visit (HOSPITAL_COMMUNITY): Payer: Self-pay

## 2024-01-04 NOTE — Telephone Encounter (Signed)
We are unable to do this prior auth as we do not have any updated insurance info on our Programme researcher, broadcasting/film/video. I have called the pharmacy and they didn't have anything current on file for her either. Can you please contact the patient and let them know we need updated insurance info to submit the prior auth.

## 2024-01-04 NOTE — Telephone Encounter (Signed)
See insurance encounter

## 2024-01-05 ENCOUNTER — Other Ambulatory Visit (HOSPITAL_COMMUNITY): Payer: Self-pay

## 2024-01-05 NOTE — Telephone Encounter (Signed)
Patient needs to upload new pharmacy benefits card-card on file is from 2024 and does not have any pharmacy benefits information.

## 2024-01-13 ENCOUNTER — Ambulatory Visit: Payer: Managed Care, Other (non HMO) | Admitting: Family Medicine

## 2024-01-15 ENCOUNTER — Other Ambulatory Visit (HOSPITAL_COMMUNITY): Payer: Self-pay

## 2024-01-19 ENCOUNTER — Other Ambulatory Visit (HOSPITAL_COMMUNITY): Payer: Self-pay

## 2024-01-21 ENCOUNTER — Other Ambulatory Visit (HOSPITAL_COMMUNITY): Payer: Self-pay

## 2024-01-21 NOTE — Telephone Encounter (Signed)
Pharmacy Patient Advocate Encounter  Received notification from [facility] that Prior Authorization for [facility] 2.4MG /0.75ML auto-injectors has been DENIED.  See denial reason below. No denial letter attached in CMM. Will attach denial letter to Media tab once received.   PA #/Case ID/Reference #: [id number]  *Insurance states that she has not been taking for 4 months but I see on her chart she has been on it since May 2024. I have done an appeal uploading the report of when she has gotten it filled and what pharmacy it was filled at. I am hoping this will reverse their determination. It can take up to 30 days for a determination in the case of an appeal.

## 2024-01-21 NOTE — Telephone Encounter (Signed)
 Spoke with pt

## 2024-01-21 NOTE — Telephone Encounter (Signed)
Pharmacy Patient Advocate Encounter   Received notification from CoverMyMeds that prior authorization for [facility] 2.4MG /0.75ML auto-injectors is required/requested.   Insurance verification completed.   The patient is insured through [facility] .   Per test claim: PA required; PA submitted to above mentioned insurance via CoverMyMeds Key/confirmation #/EOC [id number] Status is pending

## 2024-01-26 NOTE — Telephone Encounter (Signed)
 No further action needed at this time.

## 2024-02-16 ENCOUNTER — Ambulatory Visit: Payer: Managed Care, Other (non HMO) | Admitting: Family Medicine

## 2024-02-24 ENCOUNTER — Ambulatory Visit: Payer: Managed Care, Other (non HMO) | Admitting: Family Medicine

## 2024-02-24 DIAGNOSIS — E559 Vitamin D deficiency, unspecified: Secondary | ICD-10-CM

## 2024-02-29 ENCOUNTER — Ambulatory Visit: Admitting: Advanced Practice Midwife

## 2024-03-05 ENCOUNTER — Other Ambulatory Visit: Payer: Self-pay | Admitting: Family Medicine

## 2024-03-06 ENCOUNTER — Encounter: Payer: Self-pay | Admitting: Family Medicine

## 2024-03-08 ENCOUNTER — Encounter: Payer: Self-pay | Admitting: Family Medicine

## 2024-03-08 MED ORDER — ONDANSETRON 4 MG PO TBDP: 4.0000 mg | ORAL_TABLET | Freq: Three times a day (TID) | ORAL | 0 refills | Status: DC | PRN

## 2024-03-08 NOTE — Telephone Encounter (Signed)
 LVM for pt.

## 2024-03-08 NOTE — Telephone Encounter (Signed)
 Please call patient - I called in zofran for her for the nausea.  However, if she has not taking wegovy in a long time, greater than a month, than we will likely need to restart her at a lower dose again.   We will need to discuss that at her upcoming appt.

## 2024-03-11 ENCOUNTER — Ambulatory Visit: Admitting: Family Medicine

## 2024-03-11 DIAGNOSIS — E559 Vitamin D deficiency, unspecified: Secondary | ICD-10-CM

## 2024-03-29 NOTE — Telephone Encounter (Signed)
 See other encounter/messages for further information.

## 2024-05-11 ENCOUNTER — Encounter: Admitting: Family Medicine

## 2024-05-11 ENCOUNTER — Ambulatory Visit: Admitting: Family Medicine

## 2024-05-18 ENCOUNTER — Ambulatory Visit: Admitting: Family Medicine

## 2024-05-20 ENCOUNTER — Ambulatory Visit: Admitting: Family Medicine

## 2024-05-20 ENCOUNTER — Telehealth: Payer: Self-pay | Admitting: Pharmacist

## 2024-05-20 ENCOUNTER — Encounter: Payer: Self-pay | Admitting: Family Medicine

## 2024-05-20 VITALS — BP 130/80 | HR 95 | Temp 98.2°F | Wt >= 6400 oz

## 2024-05-20 DIAGNOSIS — E538 Deficiency of other specified B group vitamins: Secondary | ICD-10-CM | POA: Diagnosis not present

## 2024-05-20 DIAGNOSIS — E559 Vitamin D deficiency, unspecified: Secondary | ICD-10-CM | POA: Diagnosis not present

## 2024-05-20 DIAGNOSIS — K760 Fatty (change of) liver, not elsewhere classified: Secondary | ICD-10-CM | POA: Insufficient documentation

## 2024-05-20 DIAGNOSIS — I10 Essential (primary) hypertension: Secondary | ICD-10-CM

## 2024-05-20 DIAGNOSIS — R7309 Other abnormal glucose: Secondary | ICD-10-CM

## 2024-05-20 DIAGNOSIS — Z1211 Encounter for screening for malignant neoplasm of colon: Secondary | ICD-10-CM

## 2024-05-20 DIAGNOSIS — E034 Atrophy of thyroid (acquired): Secondary | ICD-10-CM | POA: Diagnosis not present

## 2024-05-20 DIAGNOSIS — Z6841 Body Mass Index (BMI) 40.0 and over, adult: Secondary | ICD-10-CM

## 2024-05-20 LAB — COMPREHENSIVE METABOLIC PANEL WITH GFR
ALT: 10 U/L (ref 0–35)
AST: 11 U/L (ref 0–37)
Albumin: 3.9 g/dL (ref 3.5–5.2)
Alkaline Phosphatase: 65 U/L (ref 39–117)
BUN: 16 mg/dL (ref 6–23)
CO2: 28 meq/L (ref 19–32)
Calcium: 9.4 mg/dL (ref 8.4–10.5)
Chloride: 103 meq/L (ref 96–112)
Creatinine, Ser: 0.96 mg/dL (ref 0.40–1.20)
GFR: 70.08 mL/min (ref >60.00)
Glucose, Bld: 113 mg/dL — ABNORMAL HIGH (ref 70–99)
Potassium: 4.8 meq/L (ref 3.5–5.1)
Sodium: 138 meq/L (ref 135–145)
Total Bilirubin: 0.3 mg/dL (ref 0.2–1.2)
Total Protein: 7.1 g/dL (ref 6.0–8.3)

## 2024-05-20 LAB — B12 AND FOLATE PANEL
Folate: 11.8 ng/mL (ref >5.9)
Vitamin B-12: 159 pg/mL — ABNORMAL LOW (ref 211–911)

## 2024-05-20 LAB — CBC
HCT: 41.1 % (ref 36.0–46.0)
Hemoglobin: 13.7 g/dL (ref 12.0–15.0)
MCHC: 33.2 g/dL (ref 30.0–36.0)
MCV: 91.1 fl (ref 78.0–100.0)
Platelets: 332 10*3/uL (ref 150.0–400.0)
RBC: 4.52 Mil/uL (ref 3.87–5.11)
RDW: 14 % (ref 11.5–15.5)
WBC: 7.1 10*3/uL (ref 4.0–10.5)

## 2024-05-20 LAB — LIPID PANEL
Cholesterol: 194 mg/dL (ref 0–200)
HDL: 50.3 mg/dL (ref >39.00)
LDL Cholesterol: 129 mg/dL — ABNORMAL HIGH (ref 0–99)
NonHDL: 143.92
Total CHOL/HDL Ratio: 4
Triglycerides: 74 mg/dL (ref 0.0–149.0)
VLDL: 14.8 mg/dL (ref 0.0–40.0)

## 2024-05-20 LAB — POCT GLYCOSYLATED HEMOGLOBIN (HGB A1C)
HbA1c POC (<> result, manual entry): 5.6 % (ref 4.0–5.6)
HbA1c, POC (controlled diabetic range): 5.6 % (ref 0.0–7.0)
HbA1c, POC (prediabetic range): 5.6 % — AB (ref 5.7–6.4)
Hemoglobin A1C: 5.6 % (ref 4.0–5.6)

## 2024-05-20 LAB — TSH: TSH: 3.94 u[IU]/mL (ref 0.35–5.50)

## 2024-05-20 LAB — VITAMIN D 25 HYDROXY (VIT D DEFICIENCY, FRACTURES): VITD: 17.08 ng/mL — ABNORMAL LOW (ref 30.00–100.00)

## 2024-05-20 MED ORDER — LISINOPRIL-HYDROCHLOROTHIAZIDE 10-12.5 MG PO TABS: 1.0000 | ORAL_TABLET | Freq: Every day | ORAL | 1 refills | Status: DC

## 2024-05-20 MED ORDER — ZEPBOUND 2.5 MG/0.5ML ~~LOC~~ SOAJ: 2.5000 mg | SUBCUTANEOUS | 0 refills | Status: DC

## 2024-05-20 MED ORDER — ONDANSETRON 4 MG PO TBDP: 4.0000 mg | ORAL_TABLET | Freq: Three times a day (TID) | ORAL | 0 refills | Status: AC | PRN

## 2024-05-20 MED ORDER — ZEPBOUND 5 MG/0.5ML ~~LOC~~ SOAJ: 5.0000 mg | SUBCUTANEOUS | 0 refills | Status: AC

## 2024-05-20 MED ORDER — BUPROPION HCL ER (SR) 150 MG PO TB12: 150.0000 mg | ORAL_TABLET | Freq: Two times a day (BID) | ORAL | 1 refills | Status: DC

## 2024-05-20 MED ORDER — VITAMIN D (ERGOCALCIFEROL) 1.25 MG (50000 UNIT) PO CAPS: 50000.0000 [IU] | ORAL_CAPSULE | ORAL | 3 refills | Status: DC

## 2024-05-20 NOTE — Patient Instructions (Addendum)
 Return in about 5 weeks (around 06/24/2024) for obesity.     Weekly net calorie calculator.  1500 cal Applications for calorie counting.   LOW glycemic index.lean meats/fish and veggies/fruit for meals exercise goal of 150 minutes a week (plus warm up and cool down) of cardiovascular exercise.   adequate water  consumption of at least 100 ounces a day  Exercise and healthy eating are a must to reach your goals.  You will achieve your goals if you commit to your health 100%. No shortcuts.    - Being and feeling overweight/obese is tough.    - Achieving your healthy body is tough.        - You get to choose your tough.    You CAN do this.       Great to see you today.  I have refilled the medication(s) we provide.   If labs were collected or images ordered, we will inform you of  results once we have received them and reviewed. We will contact you either by echart message, or telephone call.  Please give ample time to the testing facility, and our office to run,  receive and review results. Please do not call inquiring of results, even if you can see them in your chart. We will contact you as soon as we are able. If it has been over 1 week since the test was completed, and you have not yet heard from us , then please call us .    - echart message- for normal results that have been seen by the patient already.   - telephone call: abnormal results or if patient has not viewed results in their echart.  If a referral to a specialist was entered for you, please call us  in 2 weeks if you have not heard from the specialist office to schedule.

## 2024-05-20 NOTE — Telephone Encounter (Signed)
 Pharmacy Patient Advocate Encounter   Received notification from Patient Pharmacy that prior authorization for Zepbound 2.5MG /0.5ML pen-injectors is required/requested.   Insurance verification completed.   The patient is insured through [facility] .   Per test claim: PA required; PA submitted to above mentioned insurance via CoverMyMeds Key/confirmation #/EOC [id number] Status is pending

## 2024-05-20 NOTE — Progress Notes (Signed)
 Teresa Osborne , 1976-11-05, 48 y.o., female MRN: [id number] Patient Care Team    Relationship Specialty Notifications Start End  Mariel Shope, DO PCP - General Family Medicine  01/09/16   Marco Severs, MD Consulting Physician Urology  12/12/19     Chief Complaint  Patient presents with   Hypertension    Chronic Conditions/illness Management Obesity Pt is fasting.      Subjective: Teresa Osborne is a 48 y.o. Pt presents for OV for Chronic Conditions/illness Management and discuss morbid obesity.  Wt/lbs: 480>477> 467> 457> 465 BMI: 79.91>79.48> 77.81> 76.18> 77.38 Exercise: Patient is stopped exercising Diet:  - changes: more fruits and veggies, lean meats-unfortunately she reverted back to her prior diet. She reports she has not been taking the Wegovy  for about 5-6 months.  Authorization was needed for Wegovy , which was completed, but patient did not go back on the medicine after the prior authorization of the medication. She would like to get back on track now and restart medication to help her in her weight loss journey.  food log 1st appt >  very few vegetables over 2 weeks and mostly complex carbs. Water : getting close to 100 ounces (90 ounces now) Calories: Does not count- she thinks 1200-1500 Barriers: Meal prepping and lower back pain  HTN: Pt reports compliance with lisinopril -HCTZ . Patient denies dizziness, hyperglycemic or hypoglycemic events. Patient denies numbness, tingling in the extremities or nonhealing wounds of feet.  RF: HTN, morbid obesity, HD fhx     09/30/2023    7:47 AM 07/15/2023    7:44 AM 05/28/2023    7:42 AM 03/31/2023    9:14 AM 03/12/2022    8:31 AM  Depression screen PHQ 2/9  Decreased Interest 0 1 0 1 3  Down, Depressed, Hopeless 1 1 0 1 2  PHQ - 2 Score 1 2 0 2 5  Altered sleeping  1  0 1  Tired, decreased energy  1  1 3   Change in appetite  1  0 1  Feeling bad or failure about yourself   1  1 3   Trouble  concentrating  0  0 0  Moving slowly or fidgety/restless  0  0 3  Suicidal thoughts  0  0 0  PHQ-9 Score  6  4 16   Difficult doing work/chores  Not difficult at all  Not difficult at all Very difficult    Allergies  Allergen Reactions   Bactrim  [Sulfamethoxazole -Trimethoprim ] Rash   Social History   Social History Narrative   Married to [place]. 2 children [name] in [name].   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   Drink caffeinated beverages, wears her seatbelt, wears a bike helmet.   Smoke detector in the home   Feels safe in her relationships   Past Medical History:  Diagnosis Date   Anxiety    Bilateral swelling of feet    History of kidney stones    Hypertension    Hypothyroid    Joint pain    Kidney problem    Kidney stones    [place]   PONV (postoperative nausea and vomiting)    NOV 2018, JAN TREMORS LASTED ABOUT 15 MINUTES, LIKES SCOPOLOMINE PATCH; no issues wuth last kidney stone surgery    Pre-diabetes    Prediabetes    SOB (shortness of breath)    Thyroid  disease    Vaginal Pap smear, abnormal    Vitamin D  deficiency    Past Surgical  History:  Procedure Laterality Date   CYSTOSCOPY  10/27/2017   Procedure: CYSTOSCOPY FLEXIBLE;  Surgeon: Marco Severs, MD;  Location: [facility];  Service: Urology;;   CYSTOSCOPY WITH RETROGRADE PYELOGRAM, URETEROSCOPY AND STENT PLACEMENT Left 01/21/2018   Procedure: CYSTOSCOPY WITH RETROGRADE PYELOGRAM, URETEROSCOPY AND STENT PLACEMENT;  Surgeon: Marco Severs, MD;  Location: [facility];  Service: Urology;  Laterality: Left;   HOLMIUM LASER APPLICATION Left 01/21/2018   Procedure: HOLMIUM LASER APPLICATION;  Surgeon: Marco Severs, MD;  Location: [facility];  Service: Urology;  Laterality: Left;   IR URETERAL STENT LEFT NEW ACCESS W/O SEP NEPHROSTOMY CATH  02/18/2018   NEPHROLITHOTOMY Left 12/07/2017   Procedure: LEFT NEPHROLITHOTOMY PERCUTANEOUS AND STENT PLACEMENT;  Surgeon: Marco Severs, MD;   Location: [facility];  Service: Urology;  Laterality: Left;   NEPHROLITHOTOMY Left 02/19/2018   Procedure: NEPHROLITHOTOMY PERCUTANEOUS;  Surgeon: Marco Severs, MD;  Location: [facility];  Service: Urology;  Laterality: Left;   NEPHROLITHOTOMY Left 02/25/2018   Procedure: NEPHROLITHOTOMY PERCUTANEOUS SECOND LOOK;  Surgeon: Marco Severs, MD;  Location: [facility];  Service: Urology;  Laterality: Left;   NEPHROSTOMY Left 10/27/2017   Procedure: NEPHROSTOMY TUBE PLACEMENT;  Surgeon: Marco Severs, MD;  Location: [facility];  Service: Urology;  Laterality: Left;   Family History  Problem Relation Age of Onset   Anxiety disorder Mother    Obesity Mother    Obesity Father    Alcohol abuse Father    Heart disease Father    Hypertension Father    Arthritis Father    Mental retardation Father    Sudden death Father    Drug abuse Father    Cancer Sister 3       ovarian   Arthritis Maternal Grandmother    COPD Maternal Grandfather    Heart disease Maternal Grandfather    Cancer Paternal Grandmother        unknown, met[place]   Arthritis Paternal Grandmother    COPD Paternal Grandmother    Early death Paternal Grandmother    Cancer Paternal Grandfather        pancreatic   Alcohol abuse Paternal Grandfather    Arthritis Paternal Grandfather    COPD Paternal Grandfather    Early death Paternal Grandfather    Allergies as of 05/20/2024       Reactions   Bactrim  [sulfamethoxazole -trimethoprim ] Rash        Medication List        Accurate as of May 20, 2024 12:10 PM. If you have any questions, ask your nurse or doctor.          STOP taking these medications    buPROPion  150 MG 24 hr tablet Commonly known as: WELLBUTRIN  XL Replaced by: buPROPion  150 MG 12 hr tablet Stopped by: Napolean Backbone   Wegovy  2.4 MG/0.75ML Soaj Generic drug: Semaglutide -Weight Management Stopped by: Napolean Backbone       TAKE these medications    buPROPion  150 MG 12 hr tablet Commonly known as:  Wellbutrin  SR Take 1 tablet (150 mg total) by mouth 2 (two) times daily. Replaces: buPROPion  150 MG 24 hr tablet Started by: Napolean Backbone   levothyroxine  75 MCG tablet Commonly known as: SYNTHROID  1 tab po 6 days a week, and 1.5 tabs 1 day a week   lisinopril -hydrochlorothiazide  10-12.5 MG tablet Commonly known as: ZESTORETIC  Take 1 tablet by mouth daily.   ondansetron  4 MG disintegrating tablet Commonly known as: ZOFRAN -ODT Take 1 tablet (4 mg total)  by mouth every 8 (eight) hours as needed for nausea or vomiting.   Vitamin D  (Ergocalciferol ) 1.25 MG (50000 UNIT) Caps capsule Commonly known as: DRISDOL  Take 1 capsule (50,000 Units total) by mouth every 7 (seven) days.   Zepbound 2.5 MG/0.5ML Pen Generic drug: tirzepatide  Inject 2.5 mg into the skin once a week. Started by: Napolean Backbone   Zepbound 5 MG/0.5ML Pen Generic drug: tirzepatide  Inject 5 mg into the skin once a week. Start taking on: June 10, 2024 Started by: Napolean Backbone        All past medical history, surgical history, allergies, family history, immunizations andmedications were updated in the EMR today and reviewed under the history and medication portions of their EMR.     ROS Negative, with the exception of above mentioned in HPI   Objective:  BP 130/80   Pulse 95   Temp 98.2 F (36.8 C)   Wt (!) 465 lb (210.9 kg)   SpO2 98%   BMI 77.38 kg/m  Body mass index is 77.38 kg/m. Physical Exam Vitals and nursing note reviewed.  Constitutional:      General: She is not in acute distress.    Appearance: Normal appearance. She is obese. She is not ill-appearing, toxic-appearing or diaphoretic.  HENT:     Head: Normocephalic and atraumatic.   Eyes:     General: No scleral icterus.       Right eye: No discharge.        Left eye: No discharge.     Extraocular Movements: Extraocular movements intact.     Conjunctiva/sclera: Conjunctivae normal.     Pupils: Pupils are equal, round, and reactive to  light.    Cardiovascular:     Rate and Rhythm: Normal rate and regular rhythm.  Pulmonary:     Effort: Pulmonary effort is normal. No respiratory distress.     Breath sounds: Normal breath sounds. No wheezing, rhonchi or rales.   Musculoskeletal:     Right lower leg: No edema.     Left lower leg: No edema.   Skin:    General: Skin is warm.     Findings: No rash.   Neurological:     Mental Status: She is alert and oriented to person, place, and time. Mental status is at baseline.     Motor: No weakness.     Coordination: Coordination normal.     Gait: Gait normal.   Psychiatric:        Mood and Affect: Mood normal.        Behavior: Behavior normal.        Thought Content: Thought content normal.        Judgment: Judgment normal.    Results for orders placed or performed in visit on 05/20/24 (from the past 48 hours)  POCT HgB A1C     Status: Abnormal   Collection Time: 05/20/24  8:10 AM  Result Value Ref Range   Hemoglobin A1C 5.6 4.0 - 5.6 %   HbA1c POC (<> result, manual entry) 5.6 4.0 - 5.6 %   HbA1c, POC (prediabetic range) 5.6 (A) 5.7 - 6.4 %   HbA1c, POC (controlled diabetic range) 5.6 0.0 - 7.0 %    Assessment/Plan: Reana Chacko is a 48 y.o. female present for OV for Chronic Conditions/illness Management and obesity Morbid obesity with BMI of 70 and over, adult (HCC)-with serious comorbidity/HTN/Weight loss counseling, encounter for #4>lost to f/u 6 mos> #5 today Patient has been RE- counseled on exercise, calorie  counting, weight loss and potential medications to help with weight loss today. -Patient has been provided with online resources for: Weekly net calorie calculator.  Applications for calorie counting.  Patient was advised to ensure she is taking in adequate nutrition daily by meeting calorie goals. -Patient has been educated on dietary changes to not only lose weight but to eat healthy.  -  Patient was educated on glycemic index. -Patient has been  educated on exercise goal of 150 minutes a week (plus warm up and cool down) of cardiovascular exercise.  Patient was educated on heart rate for cardiovascular and fat burning zones. -Patient has been encouraged to maintain adequate water  consumption of at least 100 ounces a day, more if exercising/sweating.  - up to 80-90 ounces Start Zepbound 2.5 weekly injection x 4, then increase to Zepbound 5 mg weekly injection x 4 START Wellbutrin  twice daily for cravings and increased anxiety Referral to PT was placed for back pain and strengthening to help start her exercise program correctly-she states she never did start PT.  When she started exercising her back felt better.  She will let us  know if she reconsiders Follow-up 5 weeks B12 deficiency: B12 collected today  Vitamin D  deficiency: Vitamin D  collected today  Colon cancer screening: Cologuard ordered today  HTN/Morbid obesity with BMI of 70 and over, adult (HCC) Stable Continue lisinopril -hctz 10-12.5 mg qd - increase exercise - low sodium diet.  CBC, CMP, lipid, TSH collected  Reviewed expectations re: course of current medical issues. Discussed self-management of symptoms. Outlined signs and symptoms indicating need for more acute intervention. Patient verbalized understanding and all questions were answered. Patient received an After-Visit Summary.    Orders Placed This Encounter  Procedures   CBC   Comp Met (CMET)   Lipid panel   TSH   Vitamin D  (25 hydroxy)   B12 and Folate Panel   Cologuard   POCT HgB A1C   Meds ordered this encounter  Medications   ondansetron  (ZOFRAN -ODT) 4 MG disintegrating tablet    Sig: Take 1 tablet (4 mg total) by mouth every 8 (eight) hours as needed for nausea or vomiting.    Dispense:  20 tablet    Refill:  0   Vitamin D , Ergocalciferol , (DRISDOL ) 1.25 MG (50000 UNIT) CAPS capsule    Sig: Take 1 capsule (50,000 Units total) by mouth every 7 (seven) days.    Dispense:  12 capsule     Refill:  3   lisinopril -hydrochlorothiazide  (ZESTORETIC ) 10-12.5 MG tablet    Sig: Take 1 tablet by mouth daily.    Dispense:  90 tablet    Refill:  1   buPROPion  (WELLBUTRIN  SR) 150 MG 12 hr tablet    Sig: Take 1 tablet (150 mg total) by mouth 2 (two) times daily.    Dispense:  180 tablet    Refill:  1   ZEPBOUND 2.5 MG/0.5ML Pen    Sig: Inject 2.5 mg into the skin once a week.    Dispense:  2 mL    Refill:  0   ZEPBOUND 5 MG/0.5ML Pen    Sig: Inject 5 mg into the skin once a week.    Dispense:  2 mL    Refill:  0    Script 2:2 (fill lower script first)   Referral Orders  No referral(s) requested today     Note is dictated utilizing voice recognition software. Although note has been proof read prior to signing, occasional typographical errors still can  be missed. If any questions arise, please do not hesitate to call for verification.   electronically signed by:  Napolean Backbone, DO  [facility] [place]

## 2024-05-20 NOTE — Telephone Encounter (Signed)
 Pharmacy Patient Advocate Encounter  Received notification from [employer] that Prior Authorization for Zepbound 2.5MG /0.5ML pen-injectors has been APPROVED from 05/20/2024 to 11/19/2024   PA #/Case ID/Reference #:  [id number]

## 2024-05-23 ENCOUNTER — Other Ambulatory Visit: Payer: Self-pay | Admitting: Family Medicine

## 2024-05-23 ENCOUNTER — Ambulatory Visit: Payer: Self-pay | Admitting: Family Medicine

## 2024-05-23 MED ORDER — LEVOTHYROXINE SODIUM 75 MCG PO TABS: ORAL_TABLET | ORAL | 3 refills | Status: AC

## 2024-06-24 ENCOUNTER — Ambulatory Visit: Admitting: Family Medicine

## 2024-06-30 ENCOUNTER — Encounter: Admitting: Family Medicine

## 2024-07-04 ENCOUNTER — Ambulatory Visit: Admitting: Family Medicine

## 2024-07-13 ENCOUNTER — Ambulatory Visit: Admitting: Family Medicine

## 2024-08-04 ENCOUNTER — Ambulatory Visit (INDEPENDENT_AMBULATORY_CARE_PROVIDER_SITE_OTHER): Admitting: Family Medicine

## 2024-08-04 ENCOUNTER — Encounter: Payer: Self-pay | Admitting: Family Medicine

## 2024-08-04 VITALS — BP 132/82 | HR 94 | Temp 98.1°F | Ht 65.0 in | Wt >= 6400 oz

## 2024-08-04 DIAGNOSIS — I10 Essential (primary) hypertension: Secondary | ICD-10-CM | POA: Diagnosis not present

## 2024-08-04 DIAGNOSIS — Z0000 Encounter for general adult medical examination without abnormal findings: Secondary | ICD-10-CM | POA: Diagnosis not present

## 2024-08-04 DIAGNOSIS — R7309 Other abnormal glucose: Secondary | ICD-10-CM | POA: Diagnosis not present

## 2024-08-04 DIAGNOSIS — Z6841 Body Mass Index (BMI) 40.0 and over, adult: Secondary | ICD-10-CM

## 2024-08-04 DIAGNOSIS — E559 Vitamin D deficiency, unspecified: Secondary | ICD-10-CM | POA: Diagnosis not present

## 2024-08-04 DIAGNOSIS — K760 Fatty (change of) liver, not elsewhere classified: Secondary | ICD-10-CM | POA: Diagnosis not present

## 2024-08-04 DIAGNOSIS — Z1231 Encounter for screening mammogram for malignant neoplasm of breast: Secondary | ICD-10-CM | POA: Diagnosis not present

## 2024-08-04 DIAGNOSIS — Z1211 Encounter for screening for malignant neoplasm of colon: Secondary | ICD-10-CM | POA: Diagnosis not present

## 2024-08-04 DIAGNOSIS — Z23 Encounter for immunization: Secondary | ICD-10-CM | POA: Diagnosis not present

## 2024-08-04 DIAGNOSIS — E063 Autoimmune thyroiditis: Secondary | ICD-10-CM

## 2024-08-04 DIAGNOSIS — E538 Deficiency of other specified B group vitamins: Secondary | ICD-10-CM

## 2024-08-04 MED ORDER — BUPROPION HCL ER (SR) 150 MG PO TB12: 150.0000 mg | ORAL_TABLET | Freq: Two times a day (BID) | ORAL | 1 refills | Status: AC

## 2024-08-04 MED ORDER — LISINOPRIL-HYDROCHLOROTHIAZIDE 20-12.5 MG PO TABS
1.0000 | ORAL_TABLET | Freq: Every day | ORAL | 1 refills | Status: AC
Start: 2024-08-04 — End: ?

## 2024-08-04 MED ORDER — ZEPBOUND 2.5 MG/0.5ML ~~LOC~~ SOAJ
2.5000 mg | SUBCUTANEOUS | 0 refills | Status: AC
Start: 2024-08-04 — End: ?

## 2024-08-04 NOTE — Patient Instructions (Signed)

## 2024-08-04 NOTE — Progress Notes (Signed)
 Teresa Osborne , [birth year], 48 y.o., female MRN: [id number] Patient Care Team    Relationship Specialty Notifications Start End  Catherine Charlies LABOR, DO PCP - General Family Medicine  01/09/16   Sherrilee Belvie CROME, MD Consulting Physician Urology  12/12/19     Chief Complaint  Patient presents with   Annual Exam    chronic Conditions/illness Management.  Pt is fasting.      Subjective: Teresa Osborne is a 48 y.o. Pt presents for OV for annual CPE and chronic Conditions/illness Management and discuss morbid obesity.  Medication reconciliation completed today.  All past medical history updated today where appropriate  Health maintenance:  Colonoscopy: No fhx. Screen due at 45 > cologuard ordered again x 3 has kit at home Mammogram: Completed 04/29/2023-ordered [facility] Cervical cancer screening: Completed 09/19/2021- [facility] 2022 Immunizations: tdap UTD 2018,flu shot- encouraged yearly.  Infectious disease screening: HIV completed  2008. Hep C completed DEXA: Screening to start at approximately 48 years old Patient has a Dental home. Hospitalizations/ED visits: Reviewed  Morbid obesity: Wt/lbs: 480>477> 467> 457> 465> off med >474 BMI: 79.91>79.48> 77.81> 76.18> 77.38> off med >78.88 Exercise: Patient is stopped exercising Patient reports she is not getting every time at work like she used to and feels like she cannot afford the $75 monthly cost for Zepbound  at this time. Diet:  - changes: more fruits and veggies, lean meats-unfortunately she reverted back to her prior diet. She reports she has not been taking the Wegovy  for about 5-6 months.  Authorization was needed for Wegovy , which was completed, but patient did not go back on the medicine after the prior authorization of the medication. She would like to get back on track now and restart medication to help her in her weight loss journey.  food log 1st appt >  very few vegetables over 2 weeks and mostly  complex carbs. Water : getting close to 100 ounces (90 ounces now) Calories: Does not count- she thinks 1200-1500 Barriers: Meal prepping and lower back pain  HTN: Pt reports compliance with lisinopril -HCTZ . Patient denies chest pain, shortness of breath, dizziness or lower extremity edema.   RF: HTN, morbid obesity, HD fhx     08/04/2024    8:04 AM 09/30/2023    7:47 AM 07/15/2023    7:44 AM 05/28/2023    7:42 AM 03/31/2023    9:14 AM  Depression screen PHQ 2/9  Decreased Interest 0 0 1 0 1  Down, Depressed, Hopeless 1 1 1  0 1  PHQ - 2 Score 1 1 2  0 2  Altered sleeping 0  1  0  Tired, decreased energy 1  1  1   Change in appetite 1  1  0  Feeling bad or failure about yourself  1  1  1   Trouble concentrating 0  0  0  Moving slowly or fidgety/restless 0  0  0  Suicidal thoughts 0  0  0  PHQ-9 Score 4  6  4   Difficult doing work/chores Not difficult at all  Not difficult at all  Not difficult at all    Allergies  Allergen Reactions   Bactrim  [Sulfamethoxazole -Trimethoprim ] Rash   Social History   Social History Narrative   Married to [place]. 2 children [name] in [name].   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   Drink caffeinated beverages, wears her seatbelt, wears a bike helmet.   Smoke detector in the home   Feels safe  in her relationships   Past Medical History:  Diagnosis Date   Anxiety    Bilateral swelling of feet    History of kidney stones    Hypertension    Hypothyroid    Joint pain    Kidney problem    Kidney stones    [place]   PONV (postoperative nausea and vomiting)    NOV 2018, JAN TREMORS LASTED ABOUT 15 MINUTES, LIKES SCOPOLOMINE PATCH; no issues wuth last kidney stone surgery    Pre-diabetes    Prediabetes    SOB (shortness of breath)    Thyroid  disease    Vaginal Pap smear, abnormal    Vitamin D  deficiency    Past Surgical History:  Procedure Laterality Date   CYSTOSCOPY  10/27/2017   Procedure: CYSTOSCOPY FLEXIBLE;   Surgeon: Sherrilee Belvie CROME, MD;  Location: [facility];  Service: Urology;;   CYSTOSCOPY WITH RETROGRADE PYELOGRAM, URETEROSCOPY AND STENT PLACEMENT Left 01/21/2018   Procedure: CYSTOSCOPY WITH RETROGRADE PYELOGRAM, URETEROSCOPY AND STENT PLACEMENT;  Surgeon: Sherrilee Belvie CROME, MD;  Location: [facility];  Service: Urology;  Laterality: Left;   HOLMIUM LASER APPLICATION Left 01/21/2018   Procedure: HOLMIUM LASER APPLICATION;  Surgeon: Sherrilee Belvie CROME, MD;  Location: [facility];  Service: Urology;  Laterality: Left;   IR URETERAL STENT LEFT NEW ACCESS W/O SEP NEPHROSTOMY CATH  02/18/2018   NEPHROLITHOTOMY Left 12/07/2017   Procedure: LEFT NEPHROLITHOTOMY PERCUTANEOUS AND STENT PLACEMENT;  Surgeon: Sherrilee Belvie CROME, MD;  Location: [facility];  Service: Urology;  Laterality: Left;   NEPHROLITHOTOMY Left 02/19/2018   Procedure: NEPHROLITHOTOMY PERCUTANEOUS;  Surgeon: Sherrilee Belvie CROME, MD;  Location: [facility];  Service: Urology;  Laterality: Left;   NEPHROLITHOTOMY Left 02/25/2018   Procedure: NEPHROLITHOTOMY PERCUTANEOUS SECOND LOOK;  Surgeon: Sherrilee Belvie CROME, MD;  Location: [facility];  Service: Urology;  Laterality: Left;   NEPHROSTOMY Left 10/27/2017   Procedure: NEPHROSTOMY TUBE PLACEMENT;  Surgeon: Sherrilee Belvie CROME, MD;  Location: [facility];  Service: Urology;  Laterality: Left;   Family History  Problem Relation Age of Onset   Anxiety disorder Mother    Obesity Mother    Obesity Father    Alcohol abuse Father    Heart disease Father    Hypertension Father    Arthritis Father    Mental retardation Father    Sudden death Father    Drug abuse Father    Cancer Sister 59       ovarian   Arthritis Maternal Grandmother    COPD Maternal Grandfather    Heart disease Maternal Grandfather    Cancer Paternal Grandmother        unknown, met[place]   Arthritis Paternal Grandmother    COPD Paternal Grandmother    Early death Paternal Grandmother    Cancer Paternal Grandfather        pancreatic   Alcohol abuse  Paternal Grandfather    Arthritis Paternal Grandfather    COPD Paternal Grandfather    Early death Paternal Grandfather    Allergies as of 08/04/2024       Reactions   Bactrim  [sulfamethoxazole -trimethoprim ] Rash        Medication List        Accurate as of August 04, 2024  8:11 AM. If you have any questions, ask your nurse or doctor.          buPROPion  150 MG 12 hr tablet Commonly known as: Wellbutrin  SR Take 1 tablet (150 mg total) by mouth 2 (two) times daily.  levothyroxine  75 MCG tablet Commonly known as: SYNTHROID  1 tab po 6 days a week, and 1.5 tabs 1 day a week   lisinopril -hydrochlorothiazide  10-12.5 MG tablet Commonly known as: ZESTORETIC  Take 1 tablet by mouth daily.   ondansetron  4 MG disintegrating tablet Commonly known as: ZOFRAN -ODT Take 1 tablet (4 mg total) by mouth every 8 (eight) hours as needed for nausea or vomiting.   Vitamin D  (Ergocalciferol ) 1.25 MG (50000 UNIT) Caps capsule Commonly known as: DRISDOL  Take 1 capsule (50,000 Units total) by mouth every 7 (seven) days.   Zepbound  2.5 MG/0.5ML Pen Generic drug: tirzepatide  Inject 2.5 mg into the skin once a week.   Zepbound  5 MG/0.5ML Pen Generic drug: tirzepatide  Inject 5 mg into the skin once a week.        All past medical history, surgical history, allergies, family history, immunizations andmedications were updated in the EMR today and reviewed under the history and medication portions of their EMR.     ROS Negative, with the exception of above mentioned in HPI   Objective:  BP 132/82   Pulse 94   Temp 98.1 F (36.7 C)   Ht 5' 5 (1.651 m)   Wt (!) 474 lb (215 kg)   SpO2 96%   BMI 78.88 kg/m  Body mass index is 78.88 kg/m. Physical Exam Vitals and nursing note reviewed.  Constitutional:      General: She is not in acute distress.    Appearance: Normal appearance. She is obese. She is not ill-appearing or toxic-appearing.  HENT:     Head: Normocephalic and  atraumatic.     Right Ear: Tympanic membrane, ear canal and external ear normal. There is no impacted cerumen.     Left Ear: Tympanic membrane, ear canal and external ear normal. There is no impacted cerumen.     Nose: No congestion or rhinorrhea.     Mouth/Throat:     Mouth: Mucous membranes are moist.     Pharynx: Oropharynx is clear. No oropharyngeal exudate or posterior oropharyngeal erythema.  Eyes:     General: No scleral icterus.       Right eye: No discharge.        Left eye: No discharge.     Extraocular Movements: Extraocular movements intact.     Conjunctiva/sclera: Conjunctivae normal.     Pupils: Pupils are equal, round, and reactive to light.  Cardiovascular:     Rate and Rhythm: Normal rate and regular rhythm.     Pulses: Normal pulses.     Heart sounds: Normal heart sounds. No murmur heard.    No friction rub. No gallop.  Pulmonary:     Effort: Pulmonary effort is normal. No respiratory distress.     Breath sounds: Normal breath sounds. No stridor. No wheezing, rhonchi or rales.  Chest:     Chest wall: No tenderness.  Abdominal:     General: Abdomen is flat. Bowel sounds are normal. There is no distension.     Palpations: Abdomen is soft. There is no mass.     Tenderness: There is no abdominal tenderness. There is no right CVA tenderness, left CVA tenderness, guarding or rebound.     Hernia: No hernia is present.  Musculoskeletal:        General: No swelling, tenderness or deformity. Normal range of motion.     Cervical back: Normal range of motion and neck supple. No rigidity or tenderness.     Right lower leg: No edema.  Left lower leg: No edema.  Lymphadenopathy:     Cervical: No cervical adenopathy.  Skin:    General: Skin is warm and dry.     Coloration: Skin is not jaundiced or pale.     Findings: No bruising, erythema, lesion or rash.  Neurological:     General: No focal deficit present.     Mental Status: She is alert and oriented to person, place,  and time. Mental status is at baseline.     Cranial Nerves: No cranial nerve deficit.     Sensory: No sensory deficit.     Motor: No weakness.     Coordination: Coordination normal.     Gait: Gait normal.     Deep Tendon Reflexes: Reflexes normal.  Psychiatric:        Mood and Affect: Mood normal.        Behavior: Behavior normal.        Thought Content: Thought content normal.        Judgment: Judgment normal.    No results found for this or any previous visit (from the past 48 hours).   Assessment/Plan: Teresa Osborne is a 48 y.o. female present for OV for CPE and chronic Conditions/illness Management and obesity Morbid obesity with BMI of 70 and over, adult (HCC)-with serious comorbidity/HTN/Weight loss counseling, encounter for #4>lost to f/u 6 mos> #5 today> STOPPED MED D/T $75/MO COST.  We discussed today reevaluating financials to see if she can possibly make cuts elsewhere to afford the $75 a month for her Zepbound .  I did go ahead and refill this medication today in hopes that she can restart the medication for her health. Patient has been RE- counseled on exercise, calorie counting, weight loss and potential medications to help with weight loss today. -Patient has been provided with online resources for: Weekly net calorie calculator.  Applications for calorie counting.  Patient was advised to ensure she is taking in adequate nutrition daily by meeting calorie goals. -Patient has been educated on dietary changes to not only lose weight but to eat healthy.  -  Patient was educated on glycemic index. -Patient has been educated on exercise goal of 150 minutes a week (plus warm up and cool down) of cardiovascular exercise.  Patient was educated on heart rate for cardiovascular and fat burning zones. -Patient has been encouraged to maintain adequate water  consumption of at least 100 ounces a day, more if exercising/sweating.  - up to 80-90 ounces Start Zepbound  2.5 weekly  injection x 4, then increase to Zepbound  5 mg weekly injection x 4 START Wellbutrin  twice daily for cravings and increased anxiety Referral to PT was placed for back pain and strengthening to help start her exercise program correctly-she states she never did start PT.  When she started exercising her back felt better.  She will let us  know if she reconsiders Follow-up 5-6 weeks if able to restart Zepbound   B12 deficiency: B12 collected today  Vitamin D  deficiency: Vitamin D  collected today High-dose vitamin D  prescription  Colon cancer screening: Cologuard kit at home, she will complete  HTN/Morbid obesity with BMI of 70 and over, adult (HCC) Above goal Increase  lisinopril -hctz 10-12.5 mg every day> 20-12.5mg  - increase exercise - low sodium diet.   Hypothyroidism due to Hashimoto thyroiditis Labs up to date 05/23/2024 Continue Synthroid  75 mcg daily 6 days a week and 1.5 tabs 1 day a week.  Breast cancer screening by mammogram - MM 3D SCREENING MAMMOGRAM BILATERAL BREAST; Future Influenza  vaccine needed Declined   Routine general medical examination at a health care facility (Primary) - Comprehensive metabolic panel with GFR Colonoscopy: No fhx. Screen due at 45 > cologuard ordered again x 3 has kit at home Mammogram: Completed 04/29/2023-ordered [facility] Cervical cancer screening: Completed 09/19/2021- [facility] 2022 Immunizations: tdap UTD 2018,flu shot- encouraged yearly.  Infectious disease screening: HIV completed  2008. Hep C completed DEXA: Screening to start at approximately 48 years old Patient was encouraged to exercise greater than 150 minutes a week. Patient was encouraged to choose a diet filled with fresh fruits and vegetables, and lean meats. AVS provided to patient today for education/recommendation on gender specific health and safety maintenance.    Reviewed expectations re: course of current medical issues. Discussed self-management of  symptoms. Outlined signs and symptoms indicating need for more acute intervention. Patient verbalized understanding and all questions were answered. Patient received an After-Visit Summary.    Orders Placed This Encounter  Procedures   MM 3D SCREENING MAMMOGRAM BILATERAL BREAST   Comprehensive metabolic panel with GFR   Vitamin D  (25 hydroxy)   B12   No orders of the defined types were placed in this encounter.  Referral Orders  No referral(s) requested today     Note is dictated utilizing voice recognition software. Although note has been proof read prior to signing, occasional typographical errors still can be missed. If any questions arise, please do not hesitate to call for verification.   electronically signed by:  Charlies Bellini, DO  [facility] [place]

## 2024-08-05 ENCOUNTER — Ambulatory Visit: Payer: Self-pay | Admitting: Family Medicine

## 2024-08-05 LAB — COMPREHENSIVE METABOLIC PANEL WITH GFR
ALT: 13 IU/L (ref 0–32)
AST: 12 IU/L (ref 0–40)
Albumin: 4.1 g/dL (ref 3.9–4.9)
Alkaline Phosphatase: 88 IU/L (ref 44–121)
BUN/Creatinine Ratio: 16 (ref 9–23)
BUN: 16 mg/dL (ref 6–24)
Bilirubin Total: 0.4 mg/dL (ref 0.0–1.2)
CO2: 22 mmol/L (ref 20–29)
Calcium: 9.5 mg/dL (ref 8.7–10.2)
Chloride: 102 mmol/L (ref 96–106)
Creatinine, Ser: 1 mg/dL (ref 0.57–1.00)
Globulin, Total: 3.2 g/dL (ref 1.5–4.5)
Glucose: 113 mg/dL — ABNORMAL HIGH (ref 70–99)
Potassium: 5.1 mmol/L (ref 3.5–5.2)
Sodium: 138 mmol/L (ref 134–144)
Total Protein: 7.3 g/dL (ref 6.0–8.5)
eGFR: 69 mL/min/1.73 (ref >59)

## 2024-08-05 LAB — VITAMIN B12: Vitamin B-12: 238 pg/mL (ref 232–1245)

## 2024-08-05 LAB — VITAMIN D 25 HYDROXY (VIT D DEFICIENCY, FRACTURES): Vit D, 25-Hydroxy: 19.8 ng/mL — ABNORMAL LOW (ref 30.0–100.0)

## 2024-08-05 MED ORDER — VITAMIN D (ERGOCALCIFEROL) 1.25 MG (50000 UNIT) PO CAPS: ORAL_CAPSULE | ORAL | 3 refills | Status: AC

## 2024-08-31 ENCOUNTER — Ambulatory Visit: Admitting: Family Medicine

## 2024-09-01 ENCOUNTER — Ambulatory Visit: Admitting: Family Medicine

## 2024-09-05 ENCOUNTER — Telehealth: Payer: Self-pay

## 2024-09-05 NOTE — Progress Notes (Unsigned)
 VIRTUAL VISIT VIA VIDEO  I connected with Teresa Osborne on 09/06/24 at 11:20 AM EDT by a video enabled telemedicine application and verified that I am speaking with the correct person using two identifiers. Location patient: Home Location provider: [facility], Office Persons participating in the virtual visit: Patient, Dr. Catherine and ALONSO Sharps, CMA  I discussed the limitations of evaluation and management by telemedicine and the availability of in person appointments. The patient expressed understanding and agreed to proceed.     Teresa Osborne , 02/06/1976, 48 y.o., female MRN: [id number] Patient Care Team    Relationship Specialty Notifications Start End  Catherine Charlies LABOR, DO PCP - General Family Medicine  01/09/16   Sherrilee Belvie CROME, MD Consulting Physician Urology  12/12/19     Chief Complaint  Patient presents with   Back Pain    Ongoing for 3 weeks; Lower back, L side. Denies urinary concerns. Pt has tried tylrnol.      Subjective: Teresa Osborne is a 48 y.o. Pt presents for an OV with complaints of left sided lower back pain intermittently of 3 weeks duration.  Associated symptoms include mild lower abdominal cramping. Patient has had history of kidney stones, she does not feel like the pain is bad enough to be kidney stone related.  She feels the pain is worse when she is not drinking enough water .  She denies any fevers or chills, but endorses mild nausea.  Endorses mild abdominal cramping/bladder cramping.  She denies dysuria.  BMs are normal.  She has taken Tylenol /Advil  to ease her symptoms.  She denies any recent injury or increase in activity.      08/04/2024    8:04 AM 09/30/2023    7:47 AM 07/15/2023    7:44 AM 05/28/2023    7:42 AM 03/31/2023    9:14 AM  Depression screen PHQ 2/9  Decreased Interest 0 0 1 0 1  Down, Depressed, Hopeless 1 1 1  0 1  PHQ - 2 Score 1 1 2  0 2  Altered sleeping 0  1  0  Tired, decreased energy 1  1  1   Change in  appetite 1  1  0  Feeling bad or failure about yourself  1  1  1   Trouble concentrating 0  0  0  Moving slowly or fidgety/restless 0  0  0  Suicidal thoughts 0  0  0  PHQ-9 Score 4  6  4   Difficult doing work/chores Not difficult at all  Not difficult at all  Not difficult at all    Allergies  Allergen Reactions   Bactrim  [Sulfamethoxazole -Trimethoprim ] Rash   Social History   Social History Narrative   Married to Teresa Osborne. 2 children [name] in [name].   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   Drink caffeinated beverages, wears her seatbelt, wears a bike helmet.   Smoke detector in the home   Feels safe in her relationships   Past Medical History:  Diagnosis Date   Anxiety    Bilateral swelling of feet    History of kidney stones    Hypertension    Hypothyroid    Joint pain    Kidney problem    Kidney stones    Stag[place]   PONV (postoperative nausea and vomiting)    NOV 2018, JAN TREMORS LASTED ABOUT 15 MINUTES, LIKES SCOPOLOMINE PATCH; no issues wuth last kidney stone surgery    Pre-diabetes    Prediabetes  SOB (shortness of breath)    Thyroid  disease    Vaginal Pap smear, abnormal    Vitamin D  deficiency    Past Surgical History:  Procedure Laterality Date   CYSTOSCOPY  10/27/2017   Procedure: CYSTOSCOPY FLEXIBLE;  Surgeon: Sherrilee Belvie CROME, MD;  Location: [facility];  Service: Urology;;   CYSTOSCOPY WITH RETROGRADE PYELOGRAM, URETEROSCOPY AND STENT PLACEMENT Left 01/21/2018   Procedure: CYSTOSCOPY WITH RETROGRADE PYELOGRAM, URETEROSCOPY AND STENT PLACEMENT;  Surgeon: Sherrilee Belvie CROME, MD;  Location: [facility];  Service: Urology;  Laterality: Left;   HOLMIUM LASER APPLICATION Left 01/21/2018   Procedure: HOLMIUM LASER APPLICATION;  Surgeon: Sherrilee Belvie CROME, MD;  Location: [facility];  Service: Urology;  Laterality: Left;   IR URETERAL STENT LEFT NEW ACCESS W/O SEP NEPHROSTOMY CATH  02/18/2018   NEPHROLITHOTOMY Left 12/07/2017   Procedure: LEFT  NEPHROLITHOTOMY PERCUTANEOUS AND STENT PLACEMENT;  Surgeon: Sherrilee Belvie CROME, MD;  Location: [facility];  Service: Urology;  Laterality: Left;   NEPHROLITHOTOMY Left 02/19/2018   Procedure: NEPHROLITHOTOMY PERCUTANEOUS;  Surgeon: Sherrilee Belvie CROME, MD;  Location: [facility];  Service: Urology;  Laterality: Left;   NEPHROLITHOTOMY Left 02/25/2018   Procedure: NEPHROLITHOTOMY PERCUTANEOUS SECOND LOOK;  Surgeon: Sherrilee Belvie CROME, MD;  Location: [facility];  Service: Urology;  Laterality: Left;   NEPHROSTOMY Left 10/27/2017   Procedure: NEPHROSTOMY TUBE PLACEMENT;  Surgeon: Sherrilee Belvie CROME, MD;  Location: [facility];  Service: Urology;  Laterality: Left;   Family History  Problem Relation Age of Onset   Anxiety disorder Mother    Obesity Mother    Obesity Father    Alcohol abuse Father    Heart disease Father    Hypertension Father    Arthritis Father    Mental retardation Father    Sudden death Father    Drug abuse Father    Cancer Sister 64       ovarian   Arthritis Maternal Grandmother    COPD Maternal Grandfather    Heart disease Maternal Grandfather    Cancer Paternal Grandmother        unknown, met[place]   Arthritis Paternal Grandmother    COPD Paternal Grandmother    Early death Paternal Grandmother    Cancer Paternal Grandfather        pancreatic   Alcohol abuse Paternal Grandfather    Arthritis Paternal Grandfather    COPD Paternal Grandfather    Early death Paternal Grandfather    Allergies as of 09/06/2024       Reactions   Bactrim  [sulfamethoxazole -trimethoprim ] Rash        Medication List        Accurate as of September 06, 2024  3:23 PM. If you have any questions, ask your nurse or doctor.          buPROPion  150 MG 12 hr tablet Commonly known as: Wellbutrin  SR Take 1 tablet (150 mg total) by mouth 2 (two) times daily.   cephALEXin  500 MG capsule Commonly known as: KEFLEX  Take 1 capsule (500 mg total) by mouth 3 (three) times daily for 7 days. Started by: Teresa Osborne   levothyroxine  75 MCG tablet Commonly known as: SYNTHROID  1 tab po 6 days a week, and 1.5 tabs 1 day a week   lisinopril -hydrochlorothiazide  20-12.5 MG tablet Commonly known as: Zestoretic  Take 1 tablet by mouth daily.   ondansetron  4 MG disintegrating tablet Commonly known as: ZOFRAN -ODT Take 1 tablet (4 mg total) by mouth every 8 (eight) hours as needed for nausea  or vomiting.   Vitamin D  (Ergocalciferol ) 1.25 MG (50000 UNIT) Caps capsule Commonly known as: DRISDOL  1 capsule p.o. 2 times weekly   Zepbound  5 MG/0.5ML Pen Generic drug: tirzepatide  Inject 5 mg into the skin once a week.   Zepbound  2.5 MG/0.5ML Pen Generic drug: tirzepatide  Inject 2.5 mg into the skin once a week.        All past medical history, surgical history, allergies, family history, immunizations andmedications were updated in the EMR today and reviewed under the history and medication portions of their EMR.     Review of Systems  Constitutional:  Negative for chills, fever and malaise/fatigue.  Gastrointestinal:  Positive for abdominal pain and nausea. Negative for constipation, diarrhea and vomiting.  Genitourinary:  Positive for flank pain. Negative for dysuria, frequency, hematuria and urgency.  Neurological:  Negative for dizziness and headaches.  All other systems reviewed and are negative.  Negative, with the exception of above mentioned in HPI   Objective:  There were no vitals taken for this visit. There is no height or weight on file to calculate BMI.  Physical Exam Vitals and nursing note reviewed.  Constitutional:      General: She is not in acute distress.    Appearance: Normal appearance. She is not toxic-appearing.  HENT:     Head: Normocephalic and atraumatic.  Eyes:     General: No scleral icterus.       Right eye: No discharge.        Left eye: No discharge.     Conjunctiva/sclera: Conjunctivae normal.  Pulmonary:     Effort: Pulmonary effort is normal.   Abdominal:     Tenderness: There is left CVA tenderness.  Musculoskeletal:     Cervical back: Normal range of motion.  Skin:    Findings: No rash.  Neurological:     Mental Status: She is alert and oriented to person, place, and time. Mental status is at baseline.  Psychiatric:        Mood and Affect: Mood normal.        Behavior: Behavior normal.        Thought Content: Thought content normal.        Judgment: Judgment normal.      No results found. No results found. Results for orders placed or performed in visit on 09/06/24 (from the past 24 hours)  POCT Urinalysis Dipstick (Automated)     Status: Abnormal   Collection Time: 09/06/24  1:41 PM  Result Value Ref Range   Color, UA yellow    Clarity, UA cloudy    Glucose, UA Negative Negative   Bilirubin, UA neg    Ketones, UA neg    Spec Grav, UA 1.020 1.010 - 1.025   Blood, UA +-    pH, UA 6.0 5.0 - 8.0   Protein, UA Positive (A) Negative   Urobilinogen, UA 0.2 0.2 or 1.0 E.U./dL   Nitrite, UA neg    Leukocytes, UA Large (3+) (A) Negative    Assessment/Plan: Teresa Osborne is a 48 y.o. female present for OV for  Flank pain/abnormal urine Possibly UTI related versus kidney stone versus muscle skeletal etiology. We discussed having her come in to complete a urine-lab appointment only today and she is agreeable to this approach. >>>> Leukocytes 3+, trace blood, negative nitrites on point-of-care Urine culture pending Elected to start treatment with Keflex  3 times daily x 7 days while we wait on urine culture.  Patient understands that if symptoms worsen,  she produces a fever/chills or cannot tolerate p.o. she should be seen urgently to rule out kidney stone. Reviewed expectations re: course of current medical issues. Discussed self-management of symptoms. Outlined signs and symptoms indicating need for more acute intervention. Patient verbalized understanding and all questions were answered. Patient received an  After-Visit Summary.    Orders Placed This Encounter  Procedures   Urinalysis w microscopic + reflex cultur   POCT Urinalysis Dipstick (Automated)   Meds ordered this encounter  Medications   cephALEXin  (KEFLEX ) 500 MG capsule    Sig: Take 1 capsule (500 mg total) by mouth 3 (three) times daily for 7 days.    Dispense:  21 capsule    Refill:  0   Referral Orders  No referral(s) requested today     Note is dictated utilizing voice recognition software. Although note has been proof read prior to signing, occasional typographical errors still can be missed. If any questions arise, please do not hesitate to call for verification.   electronically signed by:  Charlies Bellini, DO  [facility] [place]

## 2024-09-05 NOTE — Telephone Encounter (Signed)
 Communication  Reason for CRM: Patient called in regarding labs for her virtual tomorrow, patient stated that she was told last week for her previous   appointment before she canceled that she could just come up and do urine lab. Patient stated she will come up and do it before her appointment that's tomorrow, if patient isnt allowed please give her a callback to let her know   Called and spoke with pt. Informed pt that urine specimen could not be dropped off prior to an order being placed. Advised pt to discuss with her provider at the time of her virtual tomorrow. Pt understood.

## 2024-09-06 ENCOUNTER — Telehealth (INDEPENDENT_AMBULATORY_CARE_PROVIDER_SITE_OTHER): Admitting: Family Medicine

## 2024-09-06 ENCOUNTER — Ambulatory Visit: Payer: Self-pay | Admitting: Family Medicine

## 2024-09-06 ENCOUNTER — Encounter: Payer: Self-pay | Admitting: Family Medicine

## 2024-09-06 DIAGNOSIS — R8290 Unspecified abnormal findings in urine: Secondary | ICD-10-CM | POA: Diagnosis not present

## 2024-09-06 DIAGNOSIS — R10A2 Flank pain, left side: Secondary | ICD-10-CM | POA: Diagnosis not present

## 2024-09-06 LAB — POC URINALSYSI DIPSTICK (AUTOMATED)
Bilirubin, UA: NEGATIVE
Glucose, UA: NEGATIVE
Ketones, UA: NEGATIVE
Nitrite, UA: NEGATIVE
Protein, UA: POSITIVE — AB
Spec Grav, UA: 1.02 (ref 1.010–1.025)
Urobilinogen, UA: 0.2 U/dL
pH, UA: 6 (ref 5.0–8.0)

## 2024-09-06 MED ORDER — CEPHALEXIN 500 MG PO CAPS: 500.0000 mg | ORAL_CAPSULE | Freq: Three times a day (TID) | ORAL | 0 refills | Status: AC

## 2024-09-06 NOTE — Patient Instructions (Addendum)

## 2024-09-06 NOTE — Telephone Encounter (Signed)
 Please call patient Her urinalysis showed signs of possible infection with 3+ leukocytes and trace of blood. I have called in Keflex  to take 1 tab every 8 hours for 7 days while we wait on the urine culture. If symptoms worsen, increased pain, fever or urinary changes would recommend she be seen urgently for further evaluation.   We will call her once we get the urine culture results.

## 2024-09-08 LAB — URINE CULTURE
MICRO NUMBER:: 17070937
SPECIMEN QUALITY:: ADEQUATE

## 2024-09-08 LAB — URINALYSIS W MICROSCOPIC + REFLEX CULTURE
Bilirubin Urine: NEGATIVE
Glucose, UA: NEGATIVE
Hyaline Cast: NONE SEEN /LPF
Ketones, ur: NEGATIVE
Nitrites, Initial: NEGATIVE
RBC / HPF: NONE SEEN /HPF (ref 0–2)
Specific Gravity, Urine: 1.02 (ref 1.001–1.035)
pH: 6.5 (ref 5.0–8.0)

## 2024-09-08 LAB — CULTURE INDICATED

## 2024-11-04 ENCOUNTER — Other Ambulatory Visit (HOSPITAL_COMMUNITY): Payer: Self-pay

## 2024-12-30 ENCOUNTER — Other Ambulatory Visit: Payer: Self-pay | Admitting: Family Medicine

## 2024-12-30 MED ORDER — OSELTAMIVIR PHOSPHATE 75 MG PO CAPS: 75.0000 mg | ORAL_CAPSULE | Freq: Every day | ORAL | 0 refills | Status: AC

## 2024-12-30 NOTE — Progress Notes (Signed)
 Patient's daughter seen today for flu. I sent in Tamiflu  prescription for [facility] for 10-day prophylaxis.
